# Patient Record
Sex: Female | Born: 1955 | Race: Black or African American | Hispanic: No | Marital: Single | Smoking: Never smoker
Health system: Southern US, Community
[De-identification: ages and names within clinical notes are randomized; demographics above are authoritative.]

## PROBLEM LIST (undated history)

## (undated) DIAGNOSIS — E785 Hyperlipidemia, unspecified: Secondary | ICD-10-CM

## (undated) DIAGNOSIS — A048 Other specified bacterial intestinal infections: Secondary | ICD-10-CM

## (undated) DIAGNOSIS — D126 Benign neoplasm of colon, unspecified: Secondary | ICD-10-CM

## (undated) DIAGNOSIS — K219 Gastro-esophageal reflux disease without esophagitis: Secondary | ICD-10-CM

## (undated) DIAGNOSIS — M7052 Other bursitis of knee, left knee: Principal | ICD-10-CM

## (undated) DIAGNOSIS — Z87440 Personal history of urinary (tract) infections: Secondary | ICD-10-CM

## (undated) DIAGNOSIS — I10 Essential (primary) hypertension: Secondary | ICD-10-CM

## (undated) DIAGNOSIS — E0590 Thyrotoxicosis, unspecified without thyrotoxic crisis or storm: Secondary | ICD-10-CM

## (undated) DIAGNOSIS — J45909 Unspecified asthma, uncomplicated: Secondary | ICD-10-CM

## (undated) DIAGNOSIS — M7051 Other bursitis of knee, right knee: Secondary | ICD-10-CM

## (undated) HISTORY — DX: Benign neoplasm of colon, unspecified: D12.6

## (undated) HISTORY — DX: Unspecified asthma, uncomplicated: J45.909

## (undated) HISTORY — DX: Gastro-esophageal reflux disease without esophagitis: K21.9

## (undated) HISTORY — DX: Other bursitis of knee, right knee: M70.51

## (undated) HISTORY — DX: Other specified bacterial intestinal infections: A04.8

## (undated) HISTORY — DX: Thyrotoxicosis, unspecified without thyrotoxic crisis or storm: E05.90

## (undated) HISTORY — DX: Personal history of urinary (tract) infections: Z87.440

## (undated) HISTORY — DX: Other bursitis of knee, left knee: M70.52

## (undated) HISTORY — DX: Hyperlipidemia, unspecified: E78.5

---

## 2016-05-21 ENCOUNTER — Ambulatory Visit
Admission: EM | Admit: 2016-05-21 | Discharge: 2016-05-21 | Disposition: A | Discharge status: Home / Self Care | Payer: Managed Care, Other (non HMO) | Attending: Family Medicine | Admitting: Family Medicine

## 2016-05-21 ENCOUNTER — Ambulatory Visit (INDEPENDENT_AMBULATORY_CARE_PROVIDER_SITE_OTHER): Payer: Managed Care, Other (non HMO)

## 2016-05-21 ENCOUNTER — Encounter: Payer: Self-pay | Admitting: Emergency Medicine

## 2016-05-21 ENCOUNTER — Ambulatory Visit: Payer: Managed Care, Other (non HMO)

## 2016-05-21 DIAGNOSIS — I10 Essential (primary) hypertension: Principal | ICD-10-CM

## 2016-05-21 DIAGNOSIS — R319 Hematuria, unspecified: Secondary | ICD-10-CM

## 2016-05-21 DIAGNOSIS — N898 Other specified noninflammatory disorders of vagina: Secondary | ICD-10-CM

## 2016-05-21 DIAGNOSIS — I998 Other disorder of circulatory system: Secondary | ICD-10-CM | POA: Diagnosis not present

## 2016-05-21 DIAGNOSIS — R030 Elevated blood-pressure reading, without diagnosis of hypertension: Secondary | ICD-10-CM | POA: Diagnosis not present

## 2016-05-21 DIAGNOSIS — M7551 Bursitis of right shoulder: Secondary | ICD-10-CM

## 2016-05-21 DIAGNOSIS — IMO0001 Reserved for inherently not codable concepts without codable children: Secondary | ICD-10-CM

## 2016-05-21 HISTORY — DX: Essential (primary) hypertension: I10

## 2016-05-21 LAB — URINALYSIS COMPLETE WITH MICROSCOPIC (ARMC ONLY)
Bacteria, UA: NONE SEEN
Bilirubin Urine: NEGATIVE
Glucose, UA: NEGATIVE mg/dL
Leukocytes, UA: NEGATIVE
NITRITE: NEGATIVE
PH: 6 (ref 5.0–8.0)
PROTEIN: NEGATIVE mg/dL
SPECIFIC GRAVITY, URINE: 1.015 (ref 1.005–1.030)
WBC UA: NONE SEEN WBC/hpf (ref 0–5)

## 2016-05-21 MED ORDER — TERCONAZOLE 80 MG VA SUPP: 80.0000 mg | Freq: Every day | VAGINAL | Status: DC

## 2016-05-21 MED ORDER — HYDROCHLOROTHIAZIDE 25 MG PO TABS: 25.0000 mg | ORAL_TABLET | Freq: Every day | ORAL | Status: DC

## 2016-05-21 MED ORDER — FLUCONAZOLE 150 MG PO TABS: 150.0000 mg | ORAL_TABLET | Freq: Once | ORAL | Status: DC

## 2016-05-21 MED ORDER — CLONIDINE HCL 0.2 MG PO TABS
0.2000 mg | ORAL_TABLET | Freq: Once | ORAL | Status: AC
  Administered 2016-05-21: 0.2 mg via ORAL

## 2016-05-21 MED ORDER — MELOXICAM 15 MG PO TABS: 15.0000 mg | ORAL_TABLET | Freq: Every day | ORAL | Status: DC

## 2016-05-21 MED ORDER — ATENOLOL 100 MG PO TABS: 50.0000 mg | ORAL_TABLET | Freq: Every day | ORAL | Status: DC

## 2016-05-21 NOTE — Discharge Instructions (Signed)
Bursitis Bursitis is when the fluid-filled sac (bursa) that covers and protects a joint is swollen (inflamed). Bursitis is most common near joints, especially the knees, elbows, hips, and shoulders.  HOME CARE  Take medicines only as told by your doctor.  If you were prescribed an antibiotic medicine, finish it all even if you start to feel better.  Rest the affected area as told by your doctor.  Keep the area raised up.  Avoid doing things that make the pain worse.  Apply ice to the injured area:  Place ice in a plastic bag.  Place a towel between your skin and the bag.  Leave the ice on for 20 minutes, 2-3 times a day.  Use splints, braces, pads, or walking aids as told by your doctor.  Keep all follow-up visits as told by your doctor. This is important. GET HELP IF:   You have more pain with home care.  You have a fever.  You have chills.   This information is not intended to replace advice given to you by your health care provider. Make sure you discuss any questions you have with your health care provider.   Document Released: 06/06/2010 Document Revised: 01/07/2015 Document Reviewed: 03/08/2014 Elsevier Interactive Patient Education 2016 Elsevier Inc.  Heart Disease Prevention Heart disease is a leading cause of death. There are many things you can do to help prevent heart disease. BE PHYSICALLY ACTIVE Physical activity is good for your heart. It helps control your blood pressure, cholesterol levels, and weight. Try to be physically active every day. Ask your health care provider what activities are best for you.  BE A HEALTHY WEIGHT Extra weight can strain your heart and affect your blood pressure and cholesterol levels. Lose weight with diet and exercise if recommended by your health care provider. EAT HEART-HEALTHY FOODS Follow a healthy eating plan as recommended by your health care provider or dietitian. Heart-healthy foods include:   High-fiber foods. These  include oat bran, oatmeal, and whole-grain breads and cereals.  Fruits and vegetables. Avoid:  Alcohol.  Fried foods.  Foods high in saturated fat. These include meats, butter, whole dairy products, shortening, and coconut or palm oil.  Salty foods. These include canned food, luncheon meat, salty snacks, and fast food. KEEP YOUR CHOLESTEROL LEVELS UNDER CONTROL Cholesterol is a substance that is used for many important functions. When your cholesterol levels are high, cholesterol can stick to the insides of your blood vessels, making them narrow or clog. This can lead to chest pain (angina) and a heart attack.  Keep your cholesterol levels under control as recommended by your health care provider. Have your cholesterol checked at least once a year. Target cholesterol levels (in mg/dL) for most people are:   Total cholesterol below 200.  LDL cholesterol below 100.  HDL cholesterol above 40 in men and above 50 in women.  Triglycerides below 150. KEEP YOUR BLOOD PRESSURE UNDER CONTROL Having high blood pressure (hypertension) puts you at risk for stroke and other forms of heart disease. Keep your blood pressure under control as recommended by your health care provider. Ask your health care provider if you need treatment to lower your blood pressure. If you are 85-65 years of age, have your blood pressure checked every 3-5 years. If you are 56 years of age or older, have your blood pressure checked every year. DO NOT USE TOBACCO PRODUCTS Tobacco smoke can damage your heart and blood vessels. Do not use any tobacco products including cigarettes,  chewing tobacco, or electronic cigarettes. If you need help quitting, ask your health care provider. TAKE MEDICINES AS DIRECTED Take medicines only as directed by your health care provider. Ask your health care provider whether you should take an aspirin every day. Taking aspirin can help reduce your risk of heart disease and stroke.  FOR MORE  INFORMATION  To find out more about heart disease, visit the American Heart Association's website at www.americanheart.org   This information is not intended to replace advice given to you by your health care provider. Make sure you discuss any questions you have with your health care provider.   Document Released: 07/31/2004 Document Revised: 01/07/2015 Document Reviewed: 02/10/2014 Elsevier Interactive Patient Education 2016 Elsevier Inc.  Hematuria, Adult Hematuria is blood in your urine. It can be caused by a bladder infection, kidney infection, prostate infection, kidney stone, or cancer of your urinary tract. Infections can usually be treated with medicine, and a kidney stone usually will pass through your urine. If neither of these is the cause of your hematuria, further workup to find out the reason may be needed. It is very important that you tell your health care provider about any blood you see in your urine, even if the blood stops without treatment or happens without causing pain. Blood in your urine that happens and then stops and then happens again can be a symptom of a very serious condition. Also, pain is not a symptom in the initial stages of many urinary cancers. HOME CARE INSTRUCTIONS   Drink lots of fluid, 3-4 quarts a day. If you have been diagnosed with an infection, cranberry juice is especially recommended, in addition to large amounts of water.  Avoid caffeine, tea, and carbonated beverages because they tend to irritate the bladder.  Avoid alcohol because it may irritate the prostate.  Take all medicines as directed by your health care provider.  If you were prescribed an antibiotic medicine, finish it all even if you start to feel better.  If you have been diagnosed with a kidney stone, follow your health care provider's instructions regarding straining your urine to catch the stone.  Empty your bladder often. Avoid holding urine for long periods of time.  After  a bowel movement, women should cleanse front to back. Use each tissue only once.  Empty your bladder before and after sexual intercourse if you are a female. SEEK MEDICAL CARE IF:  You develop back pain.  You have a fever.  You have a feeling of sickness in your stomach (nausea) or vomiting.  Your symptoms are not better in 3 days. Return sooner if you are getting worse. SEEK IMMEDIATE MEDICAL CARE IF:   You develop severe vomiting and are unable to keep the medicine down.  You develop severe back or abdominal pain despite taking your medicines.  You begin passing a large amount of blood or clots in your urine.  You feel extremely weak or faint, or you pass out. MAKE SURE YOU:   Understand these instructions.  Will watch your condition.  Will get help right away if you are not doing well or get worse.   This information is not intended to replace advice given to you by your health care provider. Make sure you discuss any questions you have with your health care provider.   Document Released: 12/17/2005 Document Revised: 01/07/2015 Document Reviewed: 08/17/2013 Elsevier Interactive Patient Education 2016 [employer].  Hypertension Hypertension is another name for high blood pressure. High blood pressure forces  your heart to work harder to pump blood. A blood pressure reading has two numbers, which includes a higher number over a lower number (example: 110/72). HOME CARE   Have your blood pressure rechecked by your doctor.  Only take medicine as told by your doctor. Follow the directions carefully. The medicine does not work as well if you skip doses. Skipping doses also puts you at risk for problems.  Do not smoke.  Monitor your blood pressure at home as told by your doctor. GET HELP IF:  You think you are having a reaction to the medicine you are taking.  You have repeat headaches or feel dizzy.  You have puffiness (swelling) in your ankles.  You have trouble  with your vision. GET HELP RIGHT AWAY IF:   You get a very bad headache and are confused.  You feel weak, numb, or faint.  You get chest or belly (abdominal) pain.  You throw up (vomit).  You cannot breathe very well. MAKE SURE YOU:   Understand these instructions.  Will watch your condition.  Will get help right away if you are not doing well or get worse.   This information is not intended to replace advice given to you by your health care provider. Make sure you discuss any questions you have with your health care provider.   Document Released: 06/04/2008 Document Revised: 12/22/2013 Document Reviewed: 10/09/2013 Elsevier Interactive Patient Education 2016 Elsevier Inc. Monilial Vaginitis Vaginitis in a soreness, swelling and redness (inflammation) of the vagina and vulva. Monilial vaginitis is not a sexually transmitted infection. CAUSES  Yeast vaginitis is caused by yeast (candida) that is normally found in your vagina. With a yeast infection, the candida has overgrown in number to a point that upsets the chemical balance. SYMPTOMS   White, thick vaginal discharge.  Swelling, itching, redness and irritation of the vagina and possibly the lips of the vagina (vulva).  Burning or painful urination.  Painful intercourse. DIAGNOSIS  Things that may contribute to monilial vaginitis are:  Postmenopausal and virginal states.  Pregnancy.  Infections.  Being tired, sick or stressed, especially if you had monilial vaginitis in the past.  Diabetes. Good control will help lower the chance.  Birth control pills.  Tight fitting garments.  Using bubble bath, feminine sprays, douches or deodorant tampons.  Taking certain medications that kill germs (antibiotics).  Sporadic recurrence can occur if you become ill. TREATMENT  Your caregiver will give you medication.  There are several kinds of anti monilial vaginal creams and suppositories specific for monilial  vaginitis. For recurrent yeast infections, use a suppository or cream in the vagina 2 times a week, or as directed.  Anti-monilial or steroid cream for the itching or irritation of the vulva may also be used. Get your caregiver's permission.  Painting the vagina with methylene blue solution may help if the monilial cream does not work.  Eating yogurt may help prevent monilial vaginitis. HOME CARE INSTRUCTIONS   Finish all medication as prescribed.  Do not have sex until treatment is completed or after your caregiver tells you it is okay.  Take warm sitz baths.  Do not douche.  Do not use tampons, especially scented ones.  Wear cotton underwear.  Avoid tight pants and panty hose.  Tell your sexual partner that you have a yeast infection. They should go to their caregiver if they have symptoms such as mild rash or itching.  Your sexual partner should be treated as well if your infection is  difficult to eliminate.  Practice safer sex. Use condoms.  Some vaginal medications cause latex condoms to fail. Vaginal medications that harm condoms are:  Cleocin cream.  Butoconazole (Femstat).  Terconazole (Terazol) vaginal suppository.  Miconazole (Monistat) (may be purchased over the counter). SEEK MEDICAL CARE IF:   You have a temperature by mouth above 102 F (38.9 C).  The infection is getting worse after 2 days of treatment.  The infection is not getting better after 3 days of treatment.  You develop blisters in or around your vagina.  You develop vaginal bleeding, and it is not your menstrual period.  You have pain when you urinate.  You develop intestinal problems.  You have pain with sexual intercourse.   This information is not intended to replace advice given to you by your health care provider. Make sure you discuss any questions you have with your health care provider.   Document Released: 09/26/2005 Document Revised: 03/10/2012 Document Reviewed:  06/20/2015 Elsevier Interactive Patient Education [employer].

## 2016-05-21 NOTE — ED Notes (Signed)
Patient c/o mid back pain for the past several months.

## 2016-05-21 NOTE — ED Provider Notes (Signed)
CSN: [id number]     Arrival date & time 05/21/16  1159 History   First MD Initiated Contact with Patient 05/21/16 1335    Nurses notes were reviewed. Chief Complaint  Patient presents with  . Back Pain  Multiple problems present   #1 for indeterminate amount of time several weeks she's had pain in her right shoulder. She states when she lifts something or move something she'll have pain in her right shoulder. She denies any trauma or injury to right shoulder.  #2 markedly elevated blood pressure. Patient states she's on blood pressure medicine she is taking Tenormin on a regular basis initially she states was taking 50 mg and then she stated to the nurse it was 100 mg. The other concern is that she's been here for [facility] for about 6 years has not seen a doctor and states since that time states that she is getting her blood pressure medicine from a friend or not going to need detail how she is getting the medicine who is filling the medicine. She also states that she did not take her blood pressure medicine today she comes the hospital today and of course this is urgent care She denies any chest pain or shortness of breath and cannot permit second, she's had hypertension. She states she gets nervous when she comes the doctor's office but despite nervousness blood pressure still markedly elevated. She states she does not have a PCP here in [place]  #3 vaginal irritation she states she salad [place] about 62 months ago started having vaginal irritation. She reports itching and burning in the vagina area. She denies sexual relations for over a year and she is convinced that she caught something on the toilet seat. She denies any discharge lesion just reports burning irritation inside the vaginal area    (Consider location/radiation/quality/duration/timing/severity/associated sxs/prior Treatment) Patient is a 60 y.o. female presenting with shoulder pain. The history is provided by the patient. No  language interpreter was used.  Shoulder Pain Location:  Shoulder Shoulder location:  R shoulder Pain details:    Quality:  Sharp   Radiates to:  Does not radiate   Severity:  Moderate   Onset quality:  Unable to specify   Timing:  Constant Handedness:  Right-handed Relieved by:  Nothing Worsened by:  Movement and bearing weight Ineffective treatments:  None tried   Past Medical History  Diagnosis Date  . Hypertension    Past Surgical History  Procedure Laterality Date  . Cesarean section     History reviewed. No pertinent family history. Social History  Substance Use Topics  . Smoking status: Never Smoker   . Smokeless tobacco: None  . Alcohol Use: No   OB History    No data available     Review of Systems  Respiratory: Positive for cough, chest tightness and shortness of breath.   Cardiovascular: Positive for chest pain.  Genitourinary: Positive for vaginal pain. Negative for vaginal bleeding and vaginal discharge.  All other systems reviewed and are negative.   Allergies  Review of patient's allergies indicates no known allergies.  Home Medications   Prior to Admission medications   Medication Sig Start Date End Date Taking? Authorizing Provider  atenolol (TENORMIN) 50 MG tablet Take 50 mg by mouth daily.   Yes Historical Provider, MD  atenolol (TENORMIN) 100 MG tablet Take 0.5 tablets (50 mg total) by mouth daily. 05/21/16   Frederich Cha, MD  fluconazole (DIFLUCAN) 150 MG tablet Take 1 tablet (150 mg total)  by mouth once. 05/21/16   Frederich Cha, MD  hydrochlorothiazide (HYDRODIURIL) 25 MG tablet Take 1 tablet (25 mg total) by mouth daily. 05/21/16   Frederich Cha, MD  meloxicam (MOBIC) 15 MG tablet Take 1 tablet (15 mg total) by mouth daily. 05/21/16   Frederich Cha, MD  terconazole (TERAZOL 3) 80 MG vaginal suppository Place 1 suppository (80 mg total) vaginally at bedtime. 05/21/16   Frederich Cha, MD   Meds Ordered and Administered this Visit   Medications    cloNIDine (CATAPRES) tablet 0.2 mg (0.2 mg Oral Given 05/21/16 1452)    BP 202/97 mmHg  Pulse 70  Temp(Src) 98 F (36.7 C) (Tympanic)  Resp 16  Ht 5\' 3"  (1.6 m)  Wt 200 lb (90.719 kg)  BMI 35.44 kg/m2  SpO2 98% No data found.   Physical Exam  Constitutional: She is oriented to person, place, and time. She appears well-developed and well-nourished. No distress.  HENT:  Head: Normocephalic and atraumatic.  Right Ear: External ear normal.  Left Ear: External ear normal.  Eyes: Conjunctivae are normal. Pupils are equal, round, and reactive to light.  Neck: Normal range of motion. Neck supple.  Cardiovascular: Normal rate and regular rhythm.   Pulmonary/Chest: Effort normal and breath sounds normal.  Musculoskeletal: Normal range of motion. She exhibits tenderness. She exhibits no edema.       Right shoulder: She exhibits tenderness and bony tenderness. She exhibits no deformity.       Arms: A stat has tenderness on the right scapula consistent with bursitis  Neurological: She is alert and oriented to person, place, and time.  Skin: Skin is warm and dry.  Psychiatric: She has a normal mood and affect.  Vitals reviewed.   ED Course  Procedures (including critical care time)  Labs Review Labs Reviewed  URINALYSIS COMPLETEWITH MICROSCOPIC ([place]) - Abnormal; Notable for the following:    Ketones, ur 2+ (*)    Hgb urine dipstick 1+ (*)    Squamous Epithelial / LPF 0-5 (*)    All other components within normal limits  URINE CULTURE    Imaging Review Dg Scapula Left  05/21/2016  CLINICAL DATA:  Pt states she has been having pain in left medial border of shoulder blade area x 3 months. 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 FINDINGS: There is no evidence of fracture or other focal bone lesions. Soft tissues are unremarkable. IMPRESSION: Negative. Electronically Signed   By: Skipper Cliche  M.D.   On: 05/21/2016 14:22     Visual Acuity Review  Right Eye Distance:   Left Eye Distance:   Bilateral Distance:    Right Eye Near:   Left Eye Near:    Bilateral Near:       Results for orders placed or performed during the hospital encounter of 05/21/16  Urinalysis complete, with microscopic  Result Value Ref Range   Color, Urine YELLOW YELLOW   APPearance CLEAR CLEAR   Glucose, UA NEGATIVE NEGATIVE mg/dL   Bilirubin Urine NEGATIVE NEGATIVE   Ketones, ur 2+ (A) NEGATIVE mg/dL   Specific Gravity, Urine 1.015 1.005 - 1.030   Hgb urine dipstick 1+ (A) NEGATIVE   pH 6.0 5.0 - 8.0   Protein, ur NEGATIVE NEGATIVE mg/dL   Nitrite NEGATIVE NEGATIVE   Leukocytes, UA NEGATIVE NEGATIVE   RBC / HPF 0-5 0 - 5 RBC/hpf   WBC, UA  NONE SEEN 0 - 5 WBC/hpf   Bacteria, UA NONE SEEN NONE SEEN   Squamous Epithelial / LPF 0-5 (A) NONE SEEN    MDM   1. Elevated systolic blood pressure   2. Poorly controlled blood pressure   3. Bursitis, scapulohumeral, right   4. Hematuria, undiagnosed cause   5. Vaginal irritation       Problem#1 right shoulder bursitis. Would recommend Mobic 15 mg 1 tablet day if shoulder continues to bother her as a problem she may need to have a steroid injection in the right bursa.   Problem #2 markedly elevated blood pressure. Patient states she's been on Tenormin 100 mg not sure to completely trust that but with the blood pressure being over 200 we'll go ahead and refill once a months worth of Tenormin 100 mg. Will give her the name of a PCP Dr. Lacinda Axon that she can see and to try to get his blood pressure under control. Also recommend along with treating with Tenormin also placing her on 25 mg of hydrochlorothiazide as well. While here in the urgent care she was given 0.2 mg of clonidine by mouth to try to get the systolic under [id number]   Problem #3 history of vaginal pain and irritation. Because she states that she's not been sexually active in over a year ago  treated with Diflucan orally and one round of Terazol vaginal suppository if she continues to have vaginal irritation she will need to see her PCP or Dr. have a pelvic exam   Problem #4 hematuria she does have blood in urine she'll need to have a repeat urine and will have her follow-up with her PCP that she is going to see    We'll give her a work note for today and tomorrow. Note: This dictation was prepared with Dragon dictation along with smaller phrase technology. Any transcriptional errors that result from this process are unintentional.      Frederich Cha, MD 05/21/16 [phone]

## 2016-05-22 LAB — URINE CULTURE: Special Requests: NORMAL

## 2016-06-11 ENCOUNTER — Ambulatory Visit: Payer: Managed Care, Other (non HMO) | Admitting: Family Medicine

## 2016-06-14 ENCOUNTER — Encounter: Payer: Self-pay | Admitting: Family Medicine

## 2016-06-14 ENCOUNTER — Other Ambulatory Visit (HOSPITAL_COMMUNITY)
Admission: RE | Admit: 2016-06-14 | Discharge: 2016-06-14 | Disposition: A | Discharge status: Home / Self Care | Payer: Managed Care, Other (non HMO) | Source: Ambulatory Visit | Attending: Family Medicine | Admitting: Family Medicine

## 2016-06-14 ENCOUNTER — Ambulatory Visit (INDEPENDENT_AMBULATORY_CARE_PROVIDER_SITE_OTHER): Payer: Managed Care, Other (non HMO) | Admitting: Family Medicine

## 2016-06-14 VITALS — BP 224/122 | HR 81 | Temp 98.6°F | Ht 63.0 in | Wt 192.2 lb

## 2016-06-14 DIAGNOSIS — Z0001 Encounter for general adult medical examination with abnormal findings: Secondary | ICD-10-CM | POA: Diagnosis not present

## 2016-06-14 DIAGNOSIS — Z130 Encounter for screening for diseases of the blood and blood-forming organs and certain disorders involving the immune mechanism: Secondary | ICD-10-CM | POA: Diagnosis not present

## 2016-06-14 DIAGNOSIS — Z13220 Encounter for screening for lipoid disorders: Secondary | ICD-10-CM

## 2016-06-14 DIAGNOSIS — I10 Essential (primary) hypertension: Secondary | ICD-10-CM | POA: Diagnosis not present

## 2016-06-14 DIAGNOSIS — N898 Other specified noninflammatory disorders of vagina: Secondary | ICD-10-CM

## 2016-06-14 DIAGNOSIS — Z01419 Encounter for gynecological examination (general) (routine) without abnormal findings: Secondary | ICD-10-CM | POA: Diagnosis present

## 2016-06-14 DIAGNOSIS — E0590 Thyrotoxicosis, unspecified without thyrotoxic crisis or storm: Secondary | ICD-10-CM

## 2016-06-14 DIAGNOSIS — Z124 Encounter for screening for malignant neoplasm of cervix: Secondary | ICD-10-CM

## 2016-06-14 DIAGNOSIS — Z8639 Personal history of other endocrine, nutritional and metabolic disease: Secondary | ICD-10-CM | POA: Diagnosis not present

## 2016-06-14 DIAGNOSIS — Z1151 Encounter for screening for human papillomavirus (HPV): Secondary | ICD-10-CM | POA: Diagnosis present

## 2016-06-14 DIAGNOSIS — Z1239 Encounter for other screening for malignant neoplasm of breast: Secondary | ICD-10-CM | POA: Diagnosis not present

## 2016-06-14 DIAGNOSIS — N760 Acute vaginitis: Secondary | ICD-10-CM | POA: Insufficient documentation

## 2016-06-14 DIAGNOSIS — R300 Dysuria: Secondary | ICD-10-CM | POA: Diagnosis not present

## 2016-06-14 DIAGNOSIS — R6889 Other general symptoms and signs: Secondary | ICD-10-CM

## 2016-06-14 LAB — POCT URINALYSIS DIPSTICK
Bilirubin, UA: NEGATIVE
Glucose, UA: NEGATIVE
Ketones, UA: NEGATIVE
Leukocytes, UA: NEGATIVE
NITRITE UA: NEGATIVE
PROTEIN UA: NEGATIVE
RBC UA: NEGATIVE
SPEC GRAV UA: 1.015
UROBILINOGEN UA: NEGATIVE
pH, UA: 6.5

## 2016-06-14 LAB — LIPID PANEL
Cholesterol: 193 mg/dL (ref 0–200)
HDL: 63.2 mg/dL (ref >39.00)
LDL Cholesterol: 122 mg/dL — ABNORMAL HIGH (ref 0–99)
NONHDL: 129.33
Total CHOL/HDL Ratio: 3
Triglycerides: 37 mg/dL (ref 0.0–149.0)
VLDL: 7.4 mg/dL (ref 0.0–40.0)

## 2016-06-14 LAB — CBC
HCT: 36.5 % (ref 36.0–46.0)
Hemoglobin: 12.2 g/dL (ref 12.0–15.0)
MCHC: 33.4 g/dL (ref 30.0–36.0)
MCV: 86.3 fl (ref 78.0–100.0)
Platelets: 221 10*3/uL (ref 150.0–400.0)
RBC: 4.23 Mil/uL (ref 3.87–5.11)
RDW: 14.5 % (ref 11.5–15.5)
WBC: 3.7 10*3/uL — AB (ref 4.0–10.5)

## 2016-06-14 LAB — COMPREHENSIVE METABOLIC PANEL
ALK PHOS: 61 U/L (ref 39–117)
ALT: 12 U/L (ref 0–35)
AST: 19 U/L (ref 0–37)
Albumin: 4.5 g/dL (ref 3.5–5.2)
BILIRUBIN TOTAL: 0.6 mg/dL (ref 0.2–1.2)
BUN: 11 mg/dL (ref 6–23)
CO2: 31 mEq/L (ref 19–32)
CREATININE: 0.84 mg/dL (ref 0.40–1.20)
Calcium: 10.2 mg/dL (ref 8.4–10.5)
Chloride: 104 mEq/L (ref 96–112)
GFR: 88.53 mL/min (ref >60.00)
GLUCOSE: 110 mg/dL — AB (ref 70–99)
Potassium: 3.9 mEq/L (ref 3.5–5.1)
SODIUM: 140 meq/L (ref 135–145)
TOTAL PROTEIN: 8.2 g/dL (ref 6.0–8.3)

## 2016-06-14 LAB — HEMOGLOBIN A1C: HEMOGLOBIN A1C: 5.1 % (ref 4.6–6.5)

## 2016-06-14 LAB — TSH: TSH: 1.6 u[IU]/mL (ref 0.35–4.50)

## 2016-06-14 LAB — T3, FREE: T3 FREE: 3.1 pg/mL (ref 2.3–4.2)

## 2016-06-14 LAB — T4, FREE: FREE T4: 0.97 ng/dL (ref 0.60–1.60)

## 2016-06-14 MED ORDER — AMLODIPINE BESYLATE 10 MG PO TABS: 10.0000 mg | ORAL_TABLET | Freq: Every day | ORAL | Status: DC

## 2016-06-14 NOTE — Progress Notes (Signed)
Pre visit review using our clinic review tool, if applicable. No additional management support is needed unless otherwise documented below in the visit note. 

## 2016-06-14 NOTE — Patient Instructions (Signed)
I have started you on an additional pill for your BP (amlodipine). Take it daily.  Increase the Atenolol to 100 mg (1 pill) daily.  Continue the HCTZ.  Follow up next week (Monday - 9:45 am).  Take care  Dr. Lacinda Axon

## 2016-06-15 ENCOUNTER — Encounter: Payer: Self-pay | Admitting: Family Medicine

## 2016-06-15 DIAGNOSIS — Z131 Encounter for screening for diabetes mellitus: Secondary | ICD-10-CM | POA: Insufficient documentation

## 2016-06-15 DIAGNOSIS — I10 Essential (primary) hypertension: Secondary | ICD-10-CM | POA: Insufficient documentation

## 2016-06-15 DIAGNOSIS — Z0001 Encounter for general adult medical examination with abnormal findings: Secondary | ICD-10-CM | POA: Insufficient documentation

## 2016-06-15 DIAGNOSIS — Z0000 Encounter for general adult medical examination without abnormal findings: Secondary | ICD-10-CM | POA: Insufficient documentation

## 2016-06-15 DIAGNOSIS — N898 Other specified noninflammatory disorders of vagina: Secondary | ICD-10-CM | POA: Insufficient documentation

## 2016-06-15 LAB — CYTOLOGY - PAP

## 2016-06-15 NOTE — Assessment & Plan Note (Addendum)
Uncontrolled. Blood pressure markedly elevated today. Adding Norvasc. Patient to continue atenolol and HCTZ. Advised to stop meloxicam. Follow-up next week.

## 2016-06-15 NOTE — Assessment & Plan Note (Signed)
Urinalysis was negative. Pelvic exam revealed atrophy. This is likely culprit of her symptoms.

## 2016-06-15 NOTE — Assessment & Plan Note (Signed)
Mammogram scheduled. Pap smear done today. Tetanus up-to-date. Will discuss colonoscopy, hep C screening, HIV screening at later visit.

## 2016-06-15 NOTE — Progress Notes (Signed)
Subjective:  Patient ID: Gail Baker, female    DOB: 1956-05-20  Age: 60 y.o. MRN: [id number]  CC: Establish care  HPI Gail Baker is a 60 y.o. female presents to the clinic today to establish care. Severely elevated BP today (see below). Also reports vaginal irritation/itching, burning with urination.  Preventative Healthcare  Pap smear: Has never had. In need of. Okay with doing today.  Mammogram: In need of. Has never had a mammogram. Will schedule.  Colonoscopy: Has never had. Will discuss today.  Immunizations  Tetanus - 2012.  Pneumococcal - N/A.  Flu - Not indicated at this time.  Zoster - In need of.  Hepatitis C screening - Unsure if she's had testing.  Labs: Labs today.  Exercise: No.  Alcohol use: No.  Smoking/tobacco use: No.  STD/HIV testing: Unsure if she has had HIV/STD testing previously.   Regular dental exams: No.  Wears seat belt: Yes.   HTN  BP markedly elevated today (see below).  Patient currently on atenolol and HCTZ.  She endorses compliance.  No current symptoms: Chest pain, SOB, Dizziness, etc.  Vaginal irritation  Patient reports that she's recently been experiencing some vaginal irritation.  She states that she's having itching and burning.  She also has burning with urination.  No known exacerbating or relieving.  It appears that she was treated previously with Diflucan.  She's had no improvement.  PMH, Surgical Hx, Family Hx, Social History reviewed and updated as below.  Past Medical History  Diagnosis Date  . Hypertension   . Hyperthyroidism   . Asthma     Not active. Asthma as a child.  Marland Kitchen History of UTI    Past Surgical History  Procedure Laterality Date  . Cesarean section     Family History  Problem Relation Age of Onset  . Hypertension Mother   . Hypertension Father   . Diabetes Father    Social History  Substance Use Topics  . Smoking status: Never Smoker   . Smokeless tobacco:  Never Used  . Alcohol Use: No   Review of Systems  Musculoskeletal: Positive for arthralgias.  Psychiatric/Behavioral: The patient is nervous/anxious.   All other systems reviewed and are negative.  Objective:   Today's Vitals: BP 224/122 mmHg  Pulse 81  Temp(Src) 98.6 F (37 C) (Oral)  Ht 5' 3"  (1.6 m)  Wt 192 lb 4 oz (87.204 kg)  BMI 34.06 kg/m2  SpO2 96%  Physical Exam  Constitutional: She is oriented to person, place, and time. She appears well-developed. No distress.  HENT:  Head: Normocephalic and atraumatic.  Mouth/Throat: Oropharynx is clear and moist.  Eyes: Conjunctivae are normal. No scleral icterus.  Neck: Neck supple.  Cardiovascular: Normal rate.   Pulmonary/Chest: Effort normal. She has no wheezes. She has no rales.  Abdominal: Soft. She exhibits no distension. There is no tenderness. There is no rebound and no guarding.  Genitourinary:  Pelvic Exam: External: Atrophy noted. Otherwise normal. Vagina: normal without lesions or masses Cervix: normal without lesions or masses. Pap smear: performed   Musculoskeletal: Normal range of motion. She exhibits no edema.  Neurological: She is alert and oriented to person, place, and time.  Skin: Skin is warm and dry. No rash noted.  Psychiatric: She has a normal mood and affect.  Vitals reviewed.  Assessment & Plan:   Problem List Items Addressed This Visit    Encounter for preventative adult health care exam with abnormal findings - Primary  Mammogram scheduled. Pap smear done today. Tetanus up-to-date. Will discuss colonoscopy, hep C screening, HIV screening at later visit.       Severe hypertension    Uncontrolled. Blood pressure markedly elevated today. Adding Norvasc. Patient to continue atenolol and HCTZ. Advised to stop meloxicam. Follow-up next week.      Relevant Medications   amLODipine (NORVASC) 10 MG tablet   Other Relevant Orders   Comp Met (CMET) (Completed)   HgB A1c (Completed)    Vaginal irritation    Urinalysis was negative. Pelvic exam revealed atrophy. This is likely culprit of her symptoms.      Relevant Orders   POCT Urinalysis Dipstick (Completed)    Other Visit Diagnoses    Screening for breast cancer        Relevant Orders    MM DIGITAL SCREENING BILATERAL    Screening for deficiency anemia        Relevant Orders    CBC (Completed)    Screening, lipid        Relevant Orders    Lipid Profile (Completed)    History of hyperthyroidism        Relevant Orders    TSH (Completed)    T3, free (Completed)    T4, free (Completed)    Screening for cervical cancer        Relevant Orders    Cytology - PAP      Outpatient Encounter Prescriptions as of 06/14/2016  Medication Sig  . atenolol (TENORMIN) 100 MG tablet Take 0.5 tablets (50 mg total) by mouth daily.  . hydrochlorothiazide (HYDRODIURIL) 25 MG tablet Take 1 tablet (25 mg total) by mouth daily.  . meloxicam (MOBIC) 15 MG tablet Take 1 tablet (15 mg total) by mouth daily.  . [DISCONTINUED] fluconazole (DIFLUCAN) 150 MG tablet Take 1 tablet (150 mg total) by mouth once.  . [DISCONTINUED] terconazole (TERAZOL 3) 80 MG vaginal suppository Place 1 suppository (80 mg total) vaginally at bedtime.  Marland Kitchen amLODipine (NORVASC) 10 MG tablet Take 1 tablet (10 mg total) by mouth daily.   No facility-administered encounter medications on file as of 06/14/2016.   Follow-up: Next week.  [place]

## 2016-06-18 ENCOUNTER — Encounter: Payer: Self-pay | Admitting: Family Medicine

## 2016-06-18 ENCOUNTER — Ambulatory Visit (INDEPENDENT_AMBULATORY_CARE_PROVIDER_SITE_OTHER): Payer: Managed Care, Other (non HMO) | Admitting: Family Medicine

## 2016-06-18 VITALS — BP 172/94 | HR 70 | Temp 98.4°F | Ht 63.0 in | Wt 188.8 lb

## 2016-06-18 DIAGNOSIS — I10 Essential (primary) hypertension: Secondary | ICD-10-CM

## 2016-06-18 LAB — CERVICOVAGINAL ANCILLARY ONLY
BACTERIAL VAGINITIS: NEGATIVE
Candida vaginitis: NEGATIVE

## 2016-06-18 MED ORDER — CARVEDILOL 6.25 MG PO TABS: 6.2500 mg | ORAL_TABLET | Freq: Two times a day (BID) | ORAL | Status: DC

## 2016-06-18 MED ORDER — HYDROCHLOROTHIAZIDE 25 MG PO TABS: 25.0000 mg | ORAL_TABLET | Freq: Every day | ORAL | Status: DC

## 2016-06-18 NOTE — Assessment & Plan Note (Signed)
Established problem, uncontrolled. BP improved from prior but still not at goal. Stopping atenolol. Starting carvedilol. Patient to continue both Norvasc and HCTZ. Follow up in 2 weeks.

## 2016-06-18 NOTE — Patient Instructions (Signed)
Continue the HCTZ and the Norvasc.  I have changed the atenolol to Carvedilol.   Follow up in 2 weeks.  Take care  Dr. Lacinda Axon

## 2016-06-18 NOTE — Progress Notes (Signed)
   Subjective:  Patient ID: Gail Baker, female    DOB: [birth year]  Age: 60 y.o. MRN: [id number]  CC: HTN follow up  HPI:  60 year old female presents for follow-up regarding her hypertension.  HTN  Severe and uncontrolled.  At last visit, Norvasc was added.  Patient presents today for follow-up.  She states that she's been compliant with her medications: Atenolol, HCTZ, Norvasc.  She reports that she's had a slight headache associated with the Norvasc.  She otherwise feels well. No complaints today.  Social Hx   Social History   Social History  . Marital Status: Single    Spouse Name: N/A  . Number of Children: N/A  . Years of Education: N/A   Social History Main Topics  . Smoking status: Never Smoker   . Smokeless tobacco: Never Used  . Alcohol Use: No  . Drug Use: No  . Sexual Activity: Not Asked   Other Topics Concern  . None   Social History Narrative   Review of Systems  Constitutional: Negative.   Respiratory: Negative.   Cardiovascular: Negative.   Neurological: Positive for headaches.   Objective:  BP 172/94 mmHg  Pulse 70  Temp(Src) 98.4 F (36.9 C) (Oral)  Ht 5\' 3"  (1.6 m)  Wt 188 lb 12 oz (85.616 kg)  BMI 33.44 kg/m2  SpO2 98%  BP/Weight 06/18/2016 06/14/2016 [id number]  Systolic BP Q000111Q [id number]  Diastolic BP 94 123XX123 94  Wt. (Lbs) 188.75 192.25 200  BMI 33.44 34.06 35.44   Physical Exam  Constitutional: She is oriented to person, place, and time. She appears well-developed. No distress.  Cardiovascular: Normal rate and regular rhythm.   Pulmonary/Chest: Effort normal and breath sounds normal.  Neurological: She is alert and oriented to person, place, and time.  Psychiatric: She has a normal mood and affect.  Vitals reviewed.  Lab Results  Component Value Date   WBC 3.7* 06/14/2016   HGB 12.2 06/14/2016   HCT 36.5 06/14/2016   PLT 221.0 06/14/2016   GLUCOSE 110* 06/14/2016   CHOL 193 06/14/2016   TRIG 37.0 06/14/2016   HDL  63.20 06/14/2016   LDLCALC 122* 06/14/2016   ALT 12 06/14/2016   AST 19 06/14/2016   NA 140 06/14/2016   K 3.9 06/14/2016   CL 104 06/14/2016   CREATININE 0.84 06/14/2016   BUN 11 06/14/2016   CO2 31 06/14/2016   TSH 1.60 06/14/2016   HGBA1C 5.1 06/14/2016   Assessment & Plan:   Problem List Items Addressed This Visit    Severe hypertension - Primary    Established problem, uncontrolled. BP improved from prior but still not at goal. Stopping atenolol. Starting carvedilol. Patient to continue both Norvasc and HCTZ. Follow up in 2 weeks.      Relevant Medications   carvedilol (COREG) 6.25 MG tablet   hydrochlorothiazide (HYDRODIURIL) 25 MG tablet      Meds ordered this encounter  Medications  . carvedilol (COREG) 6.25 MG tablet    Sig: Take 1 tablet (6.25 mg total) by mouth 2 (two) times daily with a meal.    Dispense:  60 tablet    Refill:  3  . hydrochlorothiazide (HYDRODIURIL) 25 MG tablet    Sig: Take 1 tablet (25 mg total) by mouth daily.    Dispense:  90 tablet    Refill:  3    Follow-up: 2 weeks.  [place]

## 2016-06-29 ENCOUNTER — Other Ambulatory Visit: Payer: Self-pay | Admitting: Family Medicine

## 2016-06-29 ENCOUNTER — Encounter: Payer: Managed Care, Other (non HMO) | Admitting: Family Medicine

## 2016-06-29 ENCOUNTER — Ambulatory Visit
Admission: RE | Admit: 2016-06-29 | Discharge: 2016-06-29 | Disposition: A | Discharge status: Home / Self Care | Payer: Managed Care, Other (non HMO) | Source: Ambulatory Visit | Attending: Family Medicine | Admitting: Family Medicine

## 2016-06-29 DIAGNOSIS — Z1239 Encounter for other screening for malignant neoplasm of breast: Secondary | ICD-10-CM

## 2016-06-29 DIAGNOSIS — Z1231 Encounter for screening mammogram for malignant neoplasm of breast: Secondary | ICD-10-CM | POA: Insufficient documentation

## 2016-07-02 ENCOUNTER — Other Ambulatory Visit: Payer: Self-pay | Admitting: Family Medicine

## 2016-07-02 ENCOUNTER — Ambulatory Visit (INDEPENDENT_AMBULATORY_CARE_PROVIDER_SITE_OTHER): Payer: Managed Care, Other (non HMO) | Admitting: Family Medicine

## 2016-07-02 ENCOUNTER — Encounter: Payer: Self-pay | Admitting: Family Medicine

## 2016-07-02 VITALS — BP 158/90 | HR 83 | Temp 98.5°F | Wt 187.6 lb

## 2016-07-02 DIAGNOSIS — I10 Essential (primary) hypertension: Secondary | ICD-10-CM | POA: Diagnosis not present

## 2016-07-02 DIAGNOSIS — N6459 Other signs and symptoms in breast: Secondary | ICD-10-CM

## 2016-07-02 MED ORDER — CARVEDILOL 12.5 MG PO TABS: 6.2500 mg | ORAL_TABLET | Freq: Two times a day (BID) | ORAL | Status: DC

## 2016-07-02 NOTE — Assessment & Plan Note (Signed)
Established problem, remains uncontrolled/not at goal. Increasing Coreg today. Will plan to add Aldactone or ACEI if continues to be uncontrolled. May need work up for secondary HTN.

## 2016-07-02 NOTE — Patient Instructions (Addendum)
I increased the coreg. Take it twice daily.  Follow up in 2 weeks.  Call if you have any concerns or issues.  Take care  Dr. Lacinda Axon

## 2016-07-02 NOTE — Progress Notes (Addendum)
   Subjective:  Patient ID: Gail Baker, female    DOB: 1956/10/05  Age: 60 y.o. MRN: [id number]  CC: Follow up HTN  HPI:  60 year old female with HTN presents for follow up.  HTN  Feeling well. BP still up.  Compliant with Norvasc, Coreg, HCTZ.  No reports of chest pain, SOB, Dizziness.  No other complaints today.  Social Hx   Social History   Social History  . Marital Status: Single    Spouse Name: N/A  . Number of Children: N/A  . Years of Education: N/A   Social History Main Topics  . Smoking status: Never Smoker   . Smokeless tobacco: Never Used  . Alcohol Use: No  . Drug Use: No  . Sexual Activity: Not Asked   Other Topics Concern  . None   Social History Narrative   Review of Systems  Constitutional: Negative.   Respiratory: Negative.   Cardiovascular: Negative.    Objective:  BP 158/90 mmHg  Pulse 83  Temp(Src) 98.5 F (36.9 C) (Oral)  Wt 187 lb 9.6 oz (85.095 kg)  SpO2 98%  BP/Weight 07/02/2016 06/18/2016 [id number]  Systolic BP 0000000 Q000111Q [id number]  Diastolic BP 90 94 123XX123  Wt. (Lbs) 187.6 188.75 192.25  BMI 33.24 33.44 34.06   Physical Exam  Constitutional: She is oriented to person, place, and time. She appears well-developed. No distress.  Cardiovascular: Normal rate and regular rhythm.   2/6 systolic murmur.  Pulmonary/Chest: Effort normal. She has no wheezes. She has no rales.  Neurological: She is alert and oriented to person, place, and time.  Psychiatric: She has a normal mood and affect.  Vitals reviewed.  Lab Results  Component Value Date   WBC 3.7* 06/14/2016   HGB 12.2 06/14/2016   HCT 36.5 06/14/2016   PLT 221.0 06/14/2016   GLUCOSE 110* 06/14/2016   CHOL 193 06/14/2016   TRIG 37.0 06/14/2016   HDL 63.20 06/14/2016   LDLCALC 122* 06/14/2016   ALT 12 06/14/2016   AST 19 06/14/2016   NA 140 06/14/2016   K 3.9 06/14/2016   CL 104 06/14/2016   CREATININE 0.84 06/14/2016   BUN 11 06/14/2016   CO2 31 06/14/2016   TSH 1.60  06/14/2016   HGBA1C 5.1 06/14/2016   Assessment & Plan:   Problem List Items Addressed This Visit    Hypertension - Primary    Established problem, remains uncontrolled/not at goal. Increasing Coreg today. Will plan to add Aldactone or ACEI if continues to be uncontrolled. May need work up for secondary HTN.      Relevant Medications   carvedilol (COREG) 12.5 MG tablet      Meds ordered this encounter  Medications  . carvedilol (COREG) 12.5 MG tablet    Sig: Take 0.5 tablets (6.25 mg total) by mouth 2 (two) times daily with a meal.    Dispense:  60 tablet    Refill:  3   Follow-up: 2 weeks  [place]

## 2016-07-02 NOTE — Progress Notes (Signed)
Pre visit review using our clinic review tool, if applicable. No additional management support is needed unless otherwise documented below in the visit note. 

## 2016-07-05 ENCOUNTER — Telehealth: Payer: Self-pay | Admitting: *Deleted

## 2016-07-05 MED ORDER — MELOXICAM 15 MG PO TABS: 15.0000 mg | ORAL_TABLET | Freq: Every day | ORAL | Status: DC

## 2016-07-05 NOTE — Telephone Encounter (Signed)
Patient stated that Kristopher Oppenheim did not receive the Meloxicam Rx Pt contact [phone]

## 2016-07-05 NOTE — Telephone Encounter (Signed)
Please advise refill, you have not prescribed this. thanks

## 2016-07-09 NOTE — Telephone Encounter (Addendum)
Patient requested a update on this medication refill  Pt [phone]

## 2016-07-09 NOTE — Telephone Encounter (Signed)
Left a VM for patient, it was refilled on the 6th, thanks

## 2016-07-10 ENCOUNTER — Ambulatory Visit
Admission: RE | Admit: 2016-07-10 | Discharge: 2016-07-10 | Disposition: A | Discharge status: Home / Self Care | Payer: Managed Care, Other (non HMO) | Source: Ambulatory Visit | Attending: Family Medicine | Admitting: Family Medicine

## 2016-07-10 DIAGNOSIS — R921 Mammographic calcification found on diagnostic imaging of breast: Secondary | ICD-10-CM | POA: Insufficient documentation

## 2016-07-10 DIAGNOSIS — N6489 Other specified disorders of breast: Secondary | ICD-10-CM | POA: Insufficient documentation

## 2016-07-10 DIAGNOSIS — N6459 Other signs and symptoms in breast: Secondary | ICD-10-CM

## 2016-07-10 DIAGNOSIS — R6889 Other general symptoms and signs: Secondary | ICD-10-CM | POA: Insufficient documentation

## 2016-07-16 ENCOUNTER — Ambulatory Visit (INDEPENDENT_AMBULATORY_CARE_PROVIDER_SITE_OTHER): Payer: Managed Care, Other (non HMO) | Admitting: Family Medicine

## 2016-07-16 ENCOUNTER — Encounter: Payer: Self-pay | Admitting: Family Medicine

## 2016-07-16 VITALS — BP 164/102 | HR 83 | Temp 98.6°F | Wt 185.5 lb

## 2016-07-16 DIAGNOSIS — I10 Essential (primary) hypertension: Secondary | ICD-10-CM

## 2016-07-16 MED ORDER — CARVEDILOL 12.5 MG PO TABS: 12.5000 mg | ORAL_TABLET | Freq: Two times a day (BID) | ORAL | Status: DC

## 2016-07-16 NOTE — Assessment & Plan Note (Signed)
Establish problem, remains uncontrolled but slowly improving. Increasing Coreg to 12.5 mg BID. F/U in 2 weeks.

## 2016-07-16 NOTE — Addendum Note (Signed)
Addended by: Coral Spikes on: 07/16/2016 02:05 PM   Modules accepted: Miquel Dunn

## 2016-07-16 NOTE — Progress Notes (Signed)
   Subjective:  Patient ID: Gail Baker, female    DOB: [birth year]  Age: 60 y.o. MRN: [id number]  CC: HTN   HPI:  60 year old female presents for follow-up regarding her hypertension. She is being followed closely by me in efforts to get her severely elevated blood pressure under control.  HTN  BP still up. See vitals below.   Endorses compliance with Norvasc, Coreg, and HCTZ.  No reported side effects.  States that she's recently eaten high sodium meals. Recent headache. No other complaints.  Social Hx   Social History   Social History  . Marital Status: Single    Spouse Name: N/A  . Number of Children: N/A  . Years of Education: N/A   Social History Main Topics  . Smoking status: Never Smoker   . Smokeless tobacco: Never Used  . Alcohol Use: No  . Drug Use: No  . Sexual Activity: Not Asked   Other Topics Concern  . None   Social History Narrative   Review of Systems  Respiratory: Negative.   Cardiovascular: Negative.   Neurological:       Headache.   Objective:  BP 164/102 mmHg  Pulse 83  Temp(Src) 98.6 F (37 C) (Oral)  Wt 185 lb 8 oz (84.142 kg)  SpO2 98%  BP/Weight 07/16/2016 07/02/2016 Q000111Q  Systolic BP 123456 0000000 Q000111Q  Diastolic BP A999333 90 94  Wt. (Lbs) 185.5 187.6 188.75  BMI 32.87 33.24 33.44   Physical Exam  Constitutional: She is oriented to person, place, and time. She appears well-developed. No distress.  Cardiovascular: Normal rate and regular rhythm.   99991111 systolic murmur heard best at 2nd ICS.  Pulmonary/Chest: Effort normal. She has no wheezes. She has no rales.  Neurological: She is alert and oriented to person, place, and time.  Psychiatric: She has a normal mood and affect.  Vitals reviewed.  Lab Results  Component Value Date   WBC 3.7* 06/14/2016   HGB 12.2 06/14/2016   HCT 36.5 06/14/2016   PLT 221.0 06/14/2016   GLUCOSE 110* 06/14/2016   CHOL 193 06/14/2016   TRIG 37.0 06/14/2016   HDL 63.20 06/14/2016   LDLCALC  122* 06/14/2016   ALT 12 06/14/2016   AST 19 06/14/2016   NA 140 06/14/2016   K 3.9 06/14/2016   CL 104 06/14/2016   CREATININE 0.84 06/14/2016   BUN 11 06/14/2016   CO2 31 06/14/2016   TSH 1.60 06/14/2016   HGBA1C 5.1 06/14/2016    Assessment & Plan:   Problem List Items Addressed This Visit    Hypertension - Primary    Establish problem, remains uncontrolled but slowly improving. Increasing Coreg to 12.5 mg BID. F/U in 2 weeks.      Relevant Medications   carvedilol (COREG) 12.5 MG tablet      Meds ordered this encounter  Medications  . carvedilol (COREG) 12.5 MG tablet    Sig: Take 1 tablet (12.5 mg total) by mouth 2 (two) times daily with a meal.    Dispense:  60 tablet    Refill:  3    Follow-up: Return in about 2 weeks (around 07/30/2016).  [place]

## 2016-07-16 NOTE — Patient Instructions (Signed)
I have increased the Coreg.  Continue the Norvasc and the HCTZ.  Follow up in 2 weeks.  Take care  Dr. Lacinda Axon

## 2016-07-30 ENCOUNTER — Telehealth: Payer: Self-pay | Admitting: *Deleted

## 2016-07-30 NOTE — Telephone Encounter (Signed)
Can be a 15 slot on that date per PCP, thanks

## 2016-07-30 NOTE — Telephone Encounter (Signed)
Yes that is fine

## 2016-07-30 NOTE — Telephone Encounter (Signed)
Her follow up is for Hypertension, can it be a 15 min slot? Please advise thanks

## 2016-07-30 NOTE — Telephone Encounter (Signed)
Scheduled

## 2016-07-30 NOTE — Telephone Encounter (Signed)
Patient requested to have her follow up on Aug 7, with Dr. Lacinda Axon. Please give a time , if he could see her this day, this will be her only off day from work.

## 2016-08-02 ENCOUNTER — Ambulatory Visit: Payer: Managed Care, Other (non HMO) | Admitting: Family Medicine

## 2016-08-06 ENCOUNTER — Encounter: Payer: Self-pay | Admitting: Family Medicine

## 2016-08-06 ENCOUNTER — Ambulatory Visit (INDEPENDENT_AMBULATORY_CARE_PROVIDER_SITE_OTHER): Payer: Managed Care, Other (non HMO) | Admitting: Family Medicine

## 2016-08-06 VITALS — BP 149/83 | HR 80 | Temp 98.4°F | Wt 185.5 lb

## 2016-08-06 DIAGNOSIS — Z1159 Encounter for screening for other viral diseases: Secondary | ICD-10-CM

## 2016-08-06 DIAGNOSIS — I10 Essential (primary) hypertension: Secondary | ICD-10-CM | POA: Diagnosis not present

## 2016-08-06 DIAGNOSIS — R300 Dysuria: Secondary | ICD-10-CM | POA: Diagnosis not present

## 2016-08-06 DIAGNOSIS — Z8639 Personal history of other endocrine, nutritional and metabolic disease: Secondary | ICD-10-CM | POA: Diagnosis not present

## 2016-08-06 LAB — POCT URINALYSIS DIPSTICK
BILIRUBIN UA: NEGATIVE
GLUCOSE UA: NEGATIVE
Ketones, UA: NEGATIVE
Leukocytes, UA: NEGATIVE
NITRITE UA: NEGATIVE
Protein, UA: NEGATIVE
RBC UA: NEGATIVE
Spec Grav, UA: <=1.005
UROBILINOGEN UA: NEGATIVE
pH, UA: 6

## 2016-08-06 NOTE — Assessment & Plan Note (Signed)
Established problem, improving. BP markedly improved. No med changes today. Advised daily BP monitoring. Follow up in 1 month.

## 2016-08-06 NOTE — Patient Instructions (Addendum)
Check your blood pressure daily and keep a log.  If they stay elevated >140/90 please let me know as I will need to add another blood pressure agent.  Follow up in 1 month.  Take care  Dr. Lacinda Axon

## 2016-08-06 NOTE — Progress Notes (Signed)
Pre visit review using our clinic review tool, if applicable. No additional management support is needed unless otherwise documented below in the visit note. 

## 2016-08-06 NOTE — Progress Notes (Signed)
Subjective:  Patient ID: Gail Baker, female    DOB: 09/13/1956  Age: 60 y.o. MRN: [id number]  CC: Follow up HTN  HPI:  60 year old female presents for follow up regarding HTN. Also reports burning with urination.  HTN  BP improving.  Compliant with Norvasc, Coreg, HCTZ.   Cut back on sodium.  Watching diet.  Dysuria  Patient has recently been experiencing vaginal burning and burning with urination.  No fever, chills.  No known exacerbating or relieving factors.  No other associated symptoms.  No other complaints today.  Social Hx   Social History   Social History  . Marital status: Single    Spouse name: N/A  . Number of children: N/A  . Years of education: N/A   Social History Main Topics  . Smoking status: Never Smoker  . Smokeless tobacco: Never Used  . Alcohol use No  . Drug use: No  . Sexual activity: Not Asked   Other Topics Concern  . None   Social History Narrative  . None    Review of Systems  Constitutional: Negative.   Respiratory: Negative.   Cardiovascular: Negative.   Genitourinary: Positive for dysuria and vaginal pain.   Objective:  BP (!) 149/83 (BP Location: Left Arm, Patient Position: Sitting, Cuff Size: Large)   Pulse 80   Temp 98.4 F (36.9 C) (Oral)   Wt 185 lb 8 oz (84.1 kg)   SpO2 100%   BMI 32.86 kg/m   BP/Weight 08/06/2016 [id number] 0000000  Systolic BP 123456 123456 0000000  Diastolic BP 83 A999333 90  Wt. (Lbs) 185.5 185.5 187.6  BMI 32.86 32.87 33.24   Physical Exam  Constitutional: She is oriented to person, place, and time. She appears well-developed. No distress.  Cardiovascular: Normal rate and regular rhythm.   Pulmonary/Chest: Effort normal. She has no wheezes. She has no rales.  Neurological: She is alert and oriented to person, place, and time.  Psychiatric: She has a normal mood and affect.  Vitals reviewed.  Lab Results  Component Value Date   WBC 3.7 (L) 06/14/2016   HGB 12.2 06/14/2016   HCT  36.5 06/14/2016   PLT 221.0 06/14/2016   GLUCOSE 110 (H) 06/14/2016   CHOL 193 06/14/2016   TRIG 37.0 06/14/2016   HDL 63.20 06/14/2016   LDLCALC 122 (H) 06/14/2016   ALT 12 06/14/2016   AST 19 06/14/2016   NA 140 06/14/2016   K 3.9 06/14/2016   CL 104 06/14/2016   CREATININE 0.84 06/14/2016   BUN 11 06/14/2016   CO2 31 06/14/2016   TSH 1.60 06/14/2016   HGBA1C 5.1 06/14/2016    Assessment & Plan:   Problem List Items Addressed This Visit    Dysuria    New acute issue. UA negative today. Symptoms likely from atrophic vaginitis. Will continue to monitor closely.       Relevant Orders   POCT Urinalysis Dipstick (Completed)   Hypertension    Established problem, improving. BP markedly improved. No med changes today. Advised daily BP monitoring. Follow up in 1 month.       Other Visit Diagnoses    History of hyperthyroidism    -  Primary   Relevant Orders   TSH   T4, free   T3, free   Need for hepatitis C screening test       Relevant Orders   Hepatitis C Antibody     Follow-up: Return in about 1 month (around 09/06/2016) for HTN  follow up.  [place]

## 2016-08-06 NOTE — Assessment & Plan Note (Signed)
New acute issue. UA negative today. Symptoms likely from atrophic vaginitis. Will continue to monitor closely.

## 2016-08-07 LAB — T4, FREE: Free T4: 1.04 ng/dL (ref 0.60–1.60)

## 2016-08-07 LAB — HEPATITIS C ANTIBODY: HCV AB: NEGATIVE

## 2016-08-07 LAB — T3, FREE: T3, Free: 2.5 pg/mL (ref 2.3–4.2)

## 2016-08-07 LAB — TSH: TSH: 0.88 u[IU]/mL (ref 0.35–4.50)

## 2016-08-10 ENCOUNTER — Telehealth: Payer: Self-pay | Admitting: *Deleted

## 2016-08-10 NOTE — Telephone Encounter (Signed)
[facility]leas let patient know that I have mailed her results

## 2016-08-10 NOTE — Telephone Encounter (Signed)
Patient has requested a call from [place]. She stated that she received her lab report, all labs were ok,however she has further questions to ask.  Pt contact [phone]

## 2016-08-10 NOTE — Telephone Encounter (Signed)
[facility]

## 2016-08-15 ENCOUNTER — Other Ambulatory Visit: Payer: Self-pay | Admitting: Family Medicine

## 2016-08-15 DIAGNOSIS — Z8639 Personal history of other endocrine, nutritional and metabolic disease: Secondary | ICD-10-CM

## 2016-08-15 NOTE — Telephone Encounter (Signed)
Pt called back stating she has some questions regarding her lab results. Please advise?   Call pt @ [phone]. Thank you!

## 2016-08-15 NOTE — Telephone Encounter (Signed)
Patient was called and stated that she is has concerns with weight loss and is unclear what could be causing this.

## 2016-09-05 ENCOUNTER — Telehealth: Payer: Self-pay | Admitting: Family Medicine

## 2016-09-05 NOTE — Telephone Encounter (Signed)
Pt returning Gail Baker call [facility] number [phone]

## 2016-09-10 ENCOUNTER — Encounter: Payer: Self-pay | Admitting: Family Medicine

## 2016-09-10 ENCOUNTER — Ambulatory Visit (INDEPENDENT_AMBULATORY_CARE_PROVIDER_SITE_OTHER): Payer: Managed Care, Other (non HMO) | Admitting: Family Medicine

## 2016-09-10 VITALS — BP 132/82 | HR 85 | Temp 98.4°F | Wt 181.2 lb

## 2016-09-10 DIAGNOSIS — K921 Melena: Secondary | ICD-10-CM | POA: Diagnosis not present

## 2016-09-10 DIAGNOSIS — R634 Abnormal weight loss: Secondary | ICD-10-CM

## 2016-09-10 DIAGNOSIS — I10 Essential (primary) hypertension: Secondary | ICD-10-CM

## 2016-09-10 LAB — TSH: TSH: 0.73 u[IU]/mL (ref 0.35–4.50)

## 2016-09-10 LAB — C-REACTIVE PROTEIN: CRP: 0.1 mg/dL — ABNORMAL LOW (ref 0.5–20.0)

## 2016-09-10 LAB — T4, FREE: FREE T4: 1.15 ng/dL (ref 0.60–1.60)

## 2016-09-10 LAB — SEDIMENTATION RATE: Sed Rate: 40 mm/hr — ABNORMAL HIGH (ref 0–30)

## 2016-09-10 LAB — T3, FREE: T3, Free: 2.9 pg/mL (ref 2.3–4.2)

## 2016-09-10 LAB — HIV ANTIBODY (ROUTINE TESTING W REFLEX): HIV: NONREACTIVE

## 2016-09-10 NOTE — Assessment & Plan Note (Addendum)
New problem. Unclear etiology and prognosis at this time. Labs today - CRP, ESR, HIV, Repeat thyroid studies. Cancer screening up to date, except for colonoscopy. Arranging for colonoscopy.

## 2016-09-10 NOTE — Progress Notes (Signed)
Subjective:  Patient ID: Gail Baker, female    DOB: 06-09-1956  Age: 60 y.o. MRN: [id number]  CC: Follow up HTN, continued weight loss  HPI:  60 year old female with HTN presents for follow up. Additional concern: weight loss.  HTN  Stable, at goal on HCTZ, Coreg, Norvasc.  Patient is compliant.  Weight loss  Patient Has had ongoing weight loss since May.  Patient states that her weight loss has been unintentional.  Initially, her weight loss was thought to be secondary to uncontrolled hyperthyroidism. She has now been off medication and her thyroid studies have been normal.  Patient states that she's changed her diet but still eats well. She states that she has a healthy appetite.  Patient is not exercising regularly.  No known reason for her to be losing weight.  She has had Pap smear and mammogram this year.  Of note, approximately 1 week ago she had an episode of hematochezia. She states that it had some mucus as well.  She denies any abdominal pain.  No fevers or chills. No night sweats. No other complaints at this time. No other associated symptoms.  Social Hx   Social History   Social History  . Marital status: Single    Spouse name: N/A  . Number of children: N/A  . Years of education: N/A   Social History Main Topics  . Smoking status: Never Smoker  . Smokeless tobacco: Never Used  . Alcohol use No  . Drug use: No  . Sexual activity: Not Asked   Other Topics Concern  . None   Social History Narrative  . None    Review of Systems  Constitutional: Positive for unexpected weight change.  Gastrointestinal: Positive for blood in stool.   Objective:  BP 132/82 (BP Location: Left Arm, Patient Position: Sitting, Cuff Size: Large)   Pulse 85   Temp 98.4 F (36.9 C) (Oral)   Wt 181 lb 4 oz (82.2 kg)   SpO2 97%   BMI 32.11 kg/m   BP/Weight 09/10/2016 08/06/2016 0/62/3762  Systolic BP 831 517 616  Diastolic BP 82 83 073  Wt. (Lbs) 181.25  185.5 185.5  BMI 32.11 32.86 32.87    Physical Exam  Constitutional: She is oriented to person, place, and time. She appears well-developed. No distress.  Cardiovascular: Normal rate and regular rhythm.   Pulmonary/Chest: Effort normal and breath sounds normal.  Abdominal: Soft. She exhibits no distension. There is no tenderness. There is no rebound and no guarding.  Neurological: She is alert and oriented to person, place, and time.  Psychiatric: She has a normal mood and affect.  Vitals reviewed.  Lab Results  Component Value Date   WBC 3.7 (L) 06/14/2016   HGB 12.2 06/14/2016   HCT 36.5 06/14/2016   PLT 221.0 06/14/2016   GLUCOSE 110 (H) 06/14/2016   CHOL 193 06/14/2016   TRIG 37.0 06/14/2016   HDL 63.20 06/14/2016   LDLCALC 122 (H) 06/14/2016   ALT 12 06/14/2016   AST 19 06/14/2016   NA 140 06/14/2016   K 3.9 06/14/2016   CL 104 06/14/2016   CREATININE 0.84 06/14/2016   BUN 11 06/14/2016   CO2 31 06/14/2016   TSH 0.88 08/06/2016   HGBA1C 5.1 06/14/2016    Assessment & Plan:   Problem List Items Addressed This Visit    Hypertension - Primary    Established problem, stable. At goal. Continue coreg, HCTZ, norvasc.      Unintentional  weight loss    New problem. Unclear etiology and prognosis at this time. Labs today - CRP, ESR, HIV, Repeat thyroid studies. Cancer screening up to date, except for colonoscopy. Arranging for colonoscopy.       Relevant Orders   Sed Rate (ESR)   C-reactive protein   Ambulatory referral to Gastroenterology   HIV antibody (with reflex)   TSH   T3, free   T4, free   Hematochezia    In setting of weight loss, referring for colonoscopy.        Other Visit Diagnoses   None.     No orders of the defined types were placed in this encounter.   Follow-up: No Follow-up on file.  Jayce Cook DO [facility] [place]  

## 2016-09-10 NOTE — Assessment & Plan Note (Signed)
Established problem, stable. At goal. Continue coreg, HCTZ, norvasc.

## 2016-09-10 NOTE — Patient Instructions (Signed)
Continue your current medications.  Follow up 1-3 months.  Take care  Dr. Lacinda Axon

## 2016-09-10 NOTE — Assessment & Plan Note (Signed)
In setting of weight loss, referring for colonoscopy.

## 2016-09-10 NOTE — Progress Notes (Signed)
Pre visit review using our clinic review tool, if applicable. No additional management support is needed unless otherwise documented below in the visit note. 

## 2016-09-11 ENCOUNTER — Encounter: Payer: Self-pay | Admitting: Endocrinology

## 2016-09-18 ENCOUNTER — Encounter: Payer: Self-pay | Admitting: Internal Medicine

## 2016-09-24 ENCOUNTER — Ambulatory Visit: Payer: Managed Care, Other (non HMO) | Admitting: Endocrinology

## 2016-10-23 ENCOUNTER — Ambulatory Visit (INDEPENDENT_AMBULATORY_CARE_PROVIDER_SITE_OTHER): Payer: Managed Care, Other (non HMO) | Admitting: Internal Medicine

## 2016-10-23 ENCOUNTER — Encounter (INDEPENDENT_AMBULATORY_CARE_PROVIDER_SITE_OTHER): Payer: Self-pay

## 2016-10-23 ENCOUNTER — Encounter: Payer: Self-pay | Admitting: Internal Medicine

## 2016-10-23 VITALS — BP 136/84 | HR 78 | Ht 62.5 in | Wt 175.2 lb

## 2016-10-23 DIAGNOSIS — R634 Abnormal weight loss: Secondary | ICD-10-CM | POA: Diagnosis not present

## 2016-10-23 DIAGNOSIS — K921 Melena: Secondary | ICD-10-CM

## 2016-10-23 MED ORDER — NA SULFATE-K SULFATE-MG SULF 17.5-3.13-1.6 GM/177ML PO SOLN: ORAL | 0 refills | Status: DC

## 2016-10-23 NOTE — Patient Instructions (Signed)
You have been scheduled for an endoscopy and colonoscopy. Please follow the written instructions given to you at your visit today. Please pick up your prep supplies at the pharmacy within the next 1-3 days. If you use inhalers (even only as needed), please bring them with you on the day of your procedure. Your physician has requested that you go to www.startemmi.com and enter the access code given to you at your visit today. This web site gives a general overview about your procedure. However, you should still follow specific instructions given to you by our office regarding your preparation for the procedure.  Please purchase the following medications over the counter and take as directed: Colace 100-200 mg every night.  If you are age 2 or older, your body mass index should be between 23-30. Your Body mass index is 31.54 kg/m. If this is out of the aforementioned range listed, please consider follow up with your Primary Care Provider.  If you are age 62 or younger, your body mass index should be between 19-25. Your Body mass index is 31.54 kg/m. If this is out of the aformentioned range listed, please consider follow up with your Primary Care Provider.

## 2016-10-23 NOTE — Progress Notes (Signed)
Patient ID: Gail Baker, female   DOB: 1956-04-27, 60 y.o.   MRN: [id number] HPI: Gail Baker is a 61 year old female with a history of hypertension and hyperthyroidism who is seen in consultation at the request of Dr. Lacinda Axon to evaluate unexplained weight loss and also intermittent blood in stool. She is here alone today. She reports that since the summer of this year she has lost about 25 pounds unintentionally. This is despite having a good appetite. She has noticed some intermittent right upper and right middle quadrant abdominal pain which seems to be worse with sitting and movement. It doesn't relate to eating. She's noticed occasional blood and mucus in her stool which she has associated with milk intake. She stopped the milk and it has mostly gone away though occasionally she still notices it. She does feel at times constipation and will occasionally use Dulcolax when she gets "uncomfortable". This is not often. She has noticed some burping, belching and borborygmi. Borborygmi improved after lactose avoidance. She denies nausea, vomiting and early satiety. She denies dysphagia and odynophagia. She reports in [place] being treated for what sounds like hyperthyroidism. She stopped medication that she was previously taking and noticed weight loss. Recent thyroid testing was normal performed by primary care.  She denies a family history of GI tract malignancy in IBD. She has never had colonoscopy.  Past Medical History:  Diagnosis Date  . Asthma    Not active. Asthma as a child.  Marland Kitchen History of UTI   . Hypertension   . Hyperthyroidism     Past Surgical History:  Procedure Laterality Date  . CESAREAN SECTION      Outpatient Medications Prior to Visit  Medication Sig Dispense Refill  . amLODipine (NORVASC) 10 MG tablet Take 1 tablet (10 mg total) by mouth daily. 90 tablet 3  . carvedilol (COREG) 12.5 MG tablet Take 1 tablet (12.5 mg total) by mouth 2 (two) times daily with a meal. 60 tablet  3  . hydrochlorothiazide (HYDRODIURIL) 25 MG tablet Take 1 tablet (25 mg total) by mouth daily. 90 tablet 3  . meloxicam (MOBIC) 15 MG tablet Take 1 tablet (15 mg total) by mouth daily. 30 tablet 0   No facility-administered medications prior to visit.     No Known Allergies  Family History  Problem Relation Age of Onset  . Hypertension Mother   . Hypertension Father   . Diabetes Father     Social History  Substance Use Topics  . Smoking status: Never Smoker  . Smokeless tobacco: Never Used  . Alcohol use No    ROS: As per history of present illness, otherwise negative  BP 136/84   Pulse 78   Ht 5' 2.5" (1.588 m)   Wt 175 lb 4 oz (79.5 kg)   BMI 31.54 kg/m  Constitutional: Well-developed and well-nourished. No distress. HEENT: Normocephalic and atraumatic. Oropharynx is clear and moist. No oropharyngeal exudate. Conjunctivae are normal.  No scleral icterus. Neck: Neck supple. Trachea midline. Cardiovascular: Normal rate, regular rhythm and intact distal pulses. No M/R/G Pulmonary/chest: Effort normal and breath sounds normal. No wheezing, rales or rhonchi. Abdominal: Soft, nontender, nondistended. Bowel sounds active throughout. There are no masses palpable. No hepatosplenomegaly. Extremities: no clubbing, cyanosis, or edema Lymphadenopathy: No cervical adenopathy noted. Neurological: Alert and oriented to person place and time. Skin: Skin is warm and dry. No rashes noted. Psychiatric: Normal mood and affect. Behavior is normal.  RELEVANT LABS AND IMAGING: CBC    Component Value Date/Time  WBC 3.7 (L) 06/14/2016 1040   RBC 4.23 06/14/2016 1040   HGB 12.2 06/14/2016 1040   HCT 36.5 06/14/2016 1040   PLT 221.0 06/14/2016 1040   MCV 86.3 06/14/2016 1040   MCHC 33.4 06/14/2016 1040   RDW 14.5 06/14/2016 1040    CMP     Component Value Date/Time   NA 140 06/14/2016 1040   K 3.9 06/14/2016 1040   CL 104 06/14/2016 1040   CO2 31 06/14/2016 1040   GLUCOSE 110  (H) 06/14/2016 1040   BUN 11 06/14/2016 1040   CREATININE 0.84 06/14/2016 1040   CALCIUM 10.2 06/14/2016 1040   PROT 8.2 06/14/2016 1040   ALBUMIN 4.5 06/14/2016 1040   AST 19 06/14/2016 1040   ALT 12 06/14/2016 1040   ALKPHOS 61 06/14/2016 1040   BILITOT 0.6 06/14/2016 1040   Hep C neg  T3, T4 and TSH normal in June and Aug 2017   ASSESSMENT/PLAN:  60 year old female with a history of hypertension and hyperthyroidism who is seen in consultation at the request of Dr. Lacinda Axon to evaluate unexplained weight loss and also intermittent blood in stool  1. Weight loss/intermittent blood in stool/CRC screening -- I recommended upper endoscopy and colonoscopy to evaluate weight loss and also her intermittent blood in stool with mucus. She's also never been screened for colorectal cancer. We discussed both tests include the risk of benefits and alternatives and she wishes to proceed. These will be arranged today. No evidence of anemia which is reassuring. Albumin is normal.  2. Intermittent constipation -- Colace 100-200 mg daily at bedtime recommended as a stool softener. Further recommendations after endoscopies as discussed above      [id number] Bing Neighbors, Do [address] [place], [place] [address]

## 2016-11-12 ENCOUNTER — Ambulatory Visit: Payer: Managed Care, Other (non HMO) | Admitting: Family Medicine

## 2016-11-12 ENCOUNTER — Encounter: Payer: Self-pay | Admitting: Internal Medicine

## 2016-11-15 ENCOUNTER — Telehealth: Payer: Self-pay | Admitting: *Deleted

## 2016-11-15 MED ORDER — AMLODIPINE BESYLATE 10 MG PO TABS: 10.0000 mg | ORAL_TABLET | Freq: Every day | ORAL | 3 refills | Status: DC

## 2016-11-15 NOTE — Telephone Encounter (Signed)
Patient requested a medication  refill for amlodipine  Pharmacy  [facility]

## 2016-11-15 NOTE — Telephone Encounter (Signed)
rx sent

## 2016-11-16 ENCOUNTER — Telehealth: Payer: Self-pay | Admitting: Family Medicine

## 2016-11-16 MED ORDER — CARVEDILOL 12.5 MG PO TABS: 12.5000 mg | ORAL_TABLET | Freq: Two times a day (BID) | ORAL | 3 refills | Status: DC

## 2016-11-16 NOTE — Telephone Encounter (Signed)
rx sent

## 2016-11-16 NOTE — Telephone Encounter (Signed)
Pt called requesting a refill on her carvedilol (COREG) 12.5 MG tablet.  [place], [place]  Call pt @ [phone]

## 2016-11-26 ENCOUNTER — Encounter: Payer: Self-pay | Admitting: Internal Medicine

## 2016-11-26 ENCOUNTER — Ambulatory Visit (AMBULATORY_SURGERY_CENTER): Payer: Managed Care, Other (non HMO) | Admitting: Internal Medicine

## 2016-11-26 VITALS — BP 140/88 | HR 75 | Temp 96.6°F | Resp 15 | Ht 62.5 in | Wt 175.0 lb

## 2016-11-26 DIAGNOSIS — K621 Rectal polyp: Secondary | ICD-10-CM | POA: Diagnosis not present

## 2016-11-26 DIAGNOSIS — D122 Benign neoplasm of ascending colon: Secondary | ICD-10-CM

## 2016-11-26 DIAGNOSIS — K921 Melena: Secondary | ICD-10-CM | POA: Diagnosis not present

## 2016-11-26 DIAGNOSIS — K2951 Unspecified chronic gastritis with bleeding: Secondary | ICD-10-CM | POA: Diagnosis not present

## 2016-11-26 DIAGNOSIS — D125 Benign neoplasm of sigmoid colon: Secondary | ICD-10-CM | POA: Diagnosis not present

## 2016-11-26 DIAGNOSIS — R634 Abnormal weight loss: Secondary | ICD-10-CM

## 2016-11-26 DIAGNOSIS — D128 Benign neoplasm of rectum: Secondary | ICD-10-CM

## 2016-11-26 DIAGNOSIS — B9681 Helicobacter pylori [H. pylori] as the cause of diseases classified elsewhere: Secondary | ICD-10-CM | POA: Diagnosis not present

## 2016-11-26 NOTE — Progress Notes (Signed)
Called to room to assist during endoscopic procedure.  Patient ID and intended procedure confirmed with present staff. Received instructions for my participation in the procedure from the performing physician.  

## 2016-11-26 NOTE — Op Note (Signed)
[place] Patient Name: Gail Baker Procedure Date: 11/26/2016 2:04 PM MRN: [id number] Endoscopist: Jerene Bears , MD Age: 60 Referring MD:  Date of Birth: 1956-08-08 Gender: Female Account #: [id number] Procedure:                Upper GI endoscopy Indications:              Weight loss, rectal bleeding Medicines:                Monitored Anesthesia Care Procedure:                Pre-Anesthesia Assessment:                           - Prior to the procedure, a History and Physical                            was performed, and patient medications and                            allergies were reviewed. The patient's tolerance of                            previous anesthesia was also reviewed. The risks                            and benefits of the procedure and the sedation                            options and risks were discussed with the patient.                            All questions were answered, and informed consent                            was obtained. Prior Anticoagulants: The patient has                            taken no previous anticoagulant or antiplatelet                            agents. ASA Grade Assessment: III - A patient with                            severe systemic disease. After reviewing the risks                            and benefits, the patient was deemed in                            satisfactory condition to undergo the procedure.                           After obtaining informed consent, the endoscope was  passed under direct vision. Throughout the                            procedure, the patient's blood pressure, pulse, and                            oxygen saturations were monitored continuously. The                            Model GIF-HQ190 [id number]) scope was introduced                            through the mouth, and advanced to the second part                            of duodenum. The  upper GI endoscopy was                            accomplished without difficulty. The patient                            tolerated the procedure well. Scope In: Scope Out: Findings:                 The examined esophagus was normal.                           Scattered mild inflammation characterized by                            erythema and linear erosions was found in the                            gastric body. Biopsies were taken with a cold                            forceps for histology and Helicobacter pylori                            testing.                           The cardia and gastric fundus were normal on                            retroflexion.                           The examined duodenum was normal. Complications:            No immediate complications. Estimated Blood Loss:     Estimated blood loss was minimal. Impression:               - Normal esophagus.                           - Gastritis. Biopsied.                           -  Normal examined duodenum. Recommendation:           - Patient has a contact number available for                            emergencies. The signs and symptoms of potential                            delayed complications were discussed with the                            patient. Return to normal activities tomorrow.                            Written discharge instructions were provided to the                            patient.                           - Resume previous diet.                           - Continue present medications.                           - Await pathology results.                           - Perform a colonoscopy today. Jerene Bears, MD 11/26/2016 2:50:49 PM This report has been signed electronically.

## 2016-11-26 NOTE — Patient Instructions (Addendum)
Impression/Recommendations:  Gastritis handout given to patient. Polyp handout given to patient.  No NSAIDS for 2 weeks.   Tylenol only.  Repeat colonoscopy based on pathology results for surveillance.  Recommend CT scan of abdomen and pelvis for complete evaluation. Contrast given to patient, with instructions to await contact from Dr. Vena Rua office nurse for further instructions for CT scan.  YOU HAD AN ENDOSCOPIC PROCEDURE TODAY AT [facility]:   Refer to the procedure report that was given to you for any specific questions about what was found during the examination.  If the procedure report does not answer your questions, please call your gastroenterologist to clarify.  If you requested that your care partner not be given the details of your procedure findings, then the procedure report has been included in a sealed envelope for you to review at your convenience later.  YOU SHOULD EXPECT: Some feelings of bloating in the abdomen. Passage of more gas than usual.  Walking can help get rid of the air that was put into your GI tract during the procedure and reduce the bloating. If you had a lower endoscopy (such as a colonoscopy or flexible sigmoidoscopy) you may notice spotting of blood in your stool or on the toilet paper. If you underwent a bowel prep for your procedure, you may not have a normal bowel movement for a few days.  Please Note:  You might notice some irritation and congestion in your nose or some drainage.  This is from the oxygen used during your procedure.  There is no need for concern and it should clear up in a day or so.  SYMPTOMS TO REPORT IMMEDIATELY:   Following lower endoscopy (colonoscopy or flexible sigmoidoscopy):  Excessive amounts of blood in the stool  Significant tenderness or worsening of abdominal pains  Swelling of the abdomen that is new, acute  Fever of 100F or higher   Following upper endoscopy (EGD)  Vomiting of blood or coffee  ground material  New chest pain or pain under the shoulder blades  Painful or persistently difficult swallowing  New shortness of breath  Fever of 100F or higher  Black, tarry-looking stools  For urgent or emergent issues, a gastroenterologist can be reached at any hour by calling [phone].   DIET:  We do recommend a small meal at first, but then you may proceed to your regular diet.  Drink plenty of fluids but you should avoid alcoholic beverages for 24 hours.  ACTIVITY:  You should plan to take it easy for the rest of today and you should NOT DRIVE or use heavy machinery until tomorrow (because of the sedation medicines used during the test).    FOLLOW UP: Our staff will call the number listed on your records the next business day following your procedure to check on you and address any questions or concerns that you may have regarding the information given to you following your procedure. If we do not reach you, we will leave a message.  However, if you are feeling well and you are not experiencing any problems, there is no need to return our call.  We will assume that you have returned to your regular daily activities without incident.  If any biopsies were taken you will be contacted by phone or by letter within the next 1-3 weeks.  Please call us at [phone] if you have not heard about the biopsies in 3 weeks.    SIGNATURES/CONFIDENTIALITY: You and/or your care partner  have signed paperwork which will be entered into your electronic medical record.  These signatures attest to the fact that that the information above on your After Visit Summary has been reviewed and is understood.  Full responsibility of the confidentiality of this discharge information lies with you and/or your care-partner.

## 2016-11-26 NOTE — Progress Notes (Signed)
Report to PACU, RN, vss, BBS= Clear.  

## 2016-11-26 NOTE — Op Note (Signed)
[place] Patient Name: Gail Baker Procedure Date: 11/26/2016 2:02 PM MRN: [id number] Endoscopist: Jerene Bears , MD Age: 60 Referring MD:  Date of Birth: [birth year] Gender: Female Account #: [id number] Procedure:                Colonoscopy Indications:              This is the patient's first colonoscopy, Rectal                            bleeding, Weight loss Medicines:                Monitored Anesthesia Care Procedure:                Pre-Anesthesia Assessment:                           - Prior to the procedure, a History and Physical                            was performed, and patient medications and                            allergies were reviewed. The patient's tolerance of                            previous anesthesia was also reviewed. The risks                            and benefits of the procedure and the sedation                            options and risks were discussed with the patient.                            All questions were answered, and informed consent                            was obtained. Prior Anticoagulants: The patient has                            taken no previous anticoagulant or antiplatelet                            agents. ASA Grade Assessment: III - A patient with                            severe systemic disease. After reviewing the risks                            and benefits, the patient was deemed in                            satisfactory condition to undergo the procedure.  After obtaining informed consent, the colonoscope                            was passed under direct vision. Throughout the                            procedure, the patient's blood pressure, pulse, and                            oxygen saturations were monitored continuously. The                            Model PCF-H190L [id number]) scope was introduced                            through the anus and advanced to the  the cecum,                            identified by appendiceal orifice and ileocecal                            valve. The colonoscopy was somewhat difficult due                            to significant looping. Successful completion of                            the procedure was aided by applying abdominal                            pressure. The patient tolerated the procedure well.                            The quality of the bowel preparation was good. The                            ileocecal valve, appendiceal orifice, and rectum                            were photographed. Scope In: 2:17:18 PM Scope Out: [id number] PM Scope Withdrawal Time: 0 hours 20 minutes 31 seconds  Total Procedure Duration: 0 hours 29 minutes 14 seconds  Findings:                 The digital rectal exam was normal.                           A 5 mm polyp was found in the ascending colon. The                            polyp was sessile. The polyp was removed with a                            cold snare. Resection and retrieval were  complete.                           A 4 mm polyp was found in the ascending colon. The                            polyp was sessile. The polyp was removed with a                            cold biopsy forceps. Resection and retrieval were                            complete.                           Two pedunculated polyps were found in the sigmoid                            colon. The polyps were 7 to 10 mm in size. These                            polyps were removed with a hot snare. Resection and                            retrieval were complete.                           A 3 mm polyp was found in the rectum. The polyp was                            sessile. The polyp was removed with a cold biopsy                            forceps. Resection and retrieval were complete.                           The retroflexed view of the distal rectum and anal                             verge was normal and showed no anal or rectal                            abnormalities. Complications:            No immediate complications. Estimated Blood Loss:     Estimated blood loss was minimal. Impression:               - One 5 mm polyp in the ascending colon, removed                            with a cold snare. Resected and retrieved.                           - One 4 mm polyp in the  ascending colon, removed                            with a cold biopsy forceps. Resected and retrieved.                           - Two 7 to 10 mm polyps in the sigmoid colon,                            removed with a hot snare. Resected and retrieved.                           - One 3 mm polyp in the rectum, removed with a cold                            biopsy forceps. Resected and retrieved.                           - The distal rectum and anal verge are normal on                            retroflexion view. Recommendation:           - Patient has a contact number available for                            emergencies. The signs and symptoms of potential                            delayed complications were discussed with the                            patient. Return to normal activities tomorrow.                            Written discharge instructions were provided to the                            patient.                           - Resume previous diet.                           - Continue present medications.                           - Await pathology results.                           - Repeat colonoscopy is recommended for                            surveillance. The colonoscopy date will be  determined after pathology results from today's                            exam become available for review.                           - No NSAIDs for 2 weeks.                           - Given weight loss, would recommend CT scan                            abd/pelvis to  complete GI evaluation. Jerene Bears, MD 11/26/2016 2:59:39 PM This report has been signed electronically.

## 2016-11-27 ENCOUNTER — Telehealth: Payer: Self-pay | Admitting: *Deleted

## 2016-11-27 NOTE — Telephone Encounter (Signed)
  Follow up Call-  Call back number 11/26/2016  Post procedure Call Back phone  # [phone]  Permission to leave phone message Yes     Patient questions:  Do you have a fever, pain , or abdominal swelling? No. Pain Score  0 *  Have you tolerated food without any problems? Yes.    Have you been able to return to your normal activities? Yes.    Do you have any questions about your discharge instructions: Diet   No. Medications  No. Follow up visit  No.  Do you have questions or concerns about your Care? No.  Actions: * If pain score is 4 or above: No action needed, pain <4.

## 2016-11-30 ENCOUNTER — Encounter: Payer: Self-pay | Admitting: Internal Medicine

## 2016-12-03 ENCOUNTER — Ambulatory Visit (INDEPENDENT_AMBULATORY_CARE_PROVIDER_SITE_OTHER): Payer: Managed Care, Other (non HMO) | Admitting: Family Medicine

## 2016-12-03 ENCOUNTER — Other Ambulatory Visit: Payer: Self-pay

## 2016-12-03 ENCOUNTER — Encounter: Payer: Self-pay | Admitting: Family Medicine

## 2016-12-03 VITALS — BP 130/74 | HR 72 | Temp 98.6°F | Resp 14 | Wt 173.1 lb

## 2016-12-03 DIAGNOSIS — I10 Essential (primary) hypertension: Secondary | ICD-10-CM

## 2016-12-03 DIAGNOSIS — M546 Pain in thoracic spine: Secondary | ICD-10-CM

## 2016-12-03 DIAGNOSIS — R634 Abnormal weight loss: Secondary | ICD-10-CM

## 2016-12-03 DIAGNOSIS — B9681 Helicobacter pylori [H. pylori] as the cause of diseases classified elsewhere: Secondary | ICD-10-CM

## 2016-12-03 DIAGNOSIS — N952 Postmenopausal atrophic vaginitis: Secondary | ICD-10-CM | POA: Diagnosis not present

## 2016-12-03 DIAGNOSIS — K2970 Gastritis, unspecified, without bleeding: Secondary | ICD-10-CM

## 2016-12-03 MED ORDER — TRAMADOL HCL 50 MG PO TABS: 50.0000 mg | ORAL_TABLET | Freq: Two times a day (BID) | ORAL | 0 refills | Status: DC | PRN

## 2016-12-03 MED ORDER — BIS SUBCIT-METRONID-TETRACYC 140-125-125 MG PO CAPS: 3.0000 | ORAL_CAPSULE | Freq: Three times a day (TID) | ORAL | 0 refills | Status: DC

## 2016-12-03 MED ORDER — OMEPRAZOLE 20 MG PO CPDR: 20.0000 mg | DELAYED_RELEASE_CAPSULE | Freq: Two times a day (BID) | ORAL | 0 refills | Status: DC

## 2016-12-03 MED ORDER — ESTROGENS, CONJUGATED 0.625 MG/GM VA CREA: 1.0000 | TOPICAL_CREAM | Freq: Every day | VAGINAL | 12 refills | Status: DC

## 2016-12-03 NOTE — Assessment & Plan Note (Signed)
At goal. Continue Norvasc, HCTZ, Coreg.

## 2016-12-03 NOTE — Assessment & Plan Note (Signed)
New problem.  MSK in origin. Treating with PRN Tramadol.

## 2016-12-03 NOTE — Progress Notes (Signed)
Subjective:  Patient ID: Gail Baker, female    DOB: 1956-01-29  Age: 60 y.o. MRN: [id number]  CC: Follow up BP and Weight loss  HPI:  60 year female with HTN and ongoing weight loss presents for follow up. She has additional complaints of thoracic back pain and vaginal irritation/itching.  HTN  At goal on amlodipine, HCTZ, Coreg.  Unintentional weight loss  Has now had colonoscopy and endoscopy. The remainder of her cancer screenings are up-to-date.  Polyps were removed and were adenomas.  Biopsies from endoscopy revealed H. Pylori.  She has not yet started on treatment. I received a message from her GI physician. We'll plan to start treatment regarding H. pylori today.  GI has recommended a CT scan regarding unintentional weight loss.  She continues to lose weight. Her weight is down almost an additional 2 pounds today.  Back pain  Patient reports intermittent thoracic back pain.  Located between the shoulder blades.  Moderate in severity.  No known relieving factors.  Likely exacerbated by work.  No other associated symptoms.  Vaginal irritation/itching  Prior exam revealed atrophy. No other findings on wet prep.  She continues to have issues regarding this.  She would like to discuss treatment options today.  Social Hx   Social History   Social History  . Marital status: Single    Spouse name: N/A  . Number of children: 1  . Years of education: N/A   Social History Main Topics  . Smoking status: Never Smoker  . Smokeless tobacco: Never Used  . Alcohol use No  . Drug use: No  . Sexual activity: Not Asked   Other Topics Concern  . None   Social History Narrative  . None    Review of Systems  Constitutional: Positive for unexpected weight change.  Gastrointestinal: Negative.    Objective:  BP 130/74 (BP Location: Left Arm, Patient Position: Sitting, Cuff Size: Large)   Pulse 72   Temp 98.6 F (37 C) (Oral)   Resp 14   Wt 173  lb 2 oz (78.5 kg)   SpO2 99%   BMI 31.16 kg/m   BP/Weight 12/03/2016 11/26/2016 [id number]  Systolic BP [id number]  Diastolic BP 74 88 84  Wt. (Lbs) 173.13 175 175.25  BMI 31.16 31.5 31.54   Physical Exam  Constitutional: She is oriented to person, place, and time. She appears well-developed. No distress.  Cardiovascular: Normal rate and regular rhythm.   Pulmonary/Chest: Effort normal and breath sounds normal.  Musculoskeletal:  Thoracic spine nontender. No areas of spasm.  Neurological: She is alert and oriented to person, place, and time.  Psychiatric: She has a normal mood and affect.  Vitals reviewed.  Lab Results  Component Value Date   WBC 3.7 (L) 06/14/2016   HGB 12.2 06/14/2016   HCT 36.5 06/14/2016   PLT 221.0 06/14/2016   GLUCOSE 110 (H) 06/14/2016   CHOL 193 06/14/2016   TRIG 37.0 06/14/2016   HDL 63.20 06/14/2016   LDLCALC 122 (H) 06/14/2016   ALT 12 06/14/2016   AST 19 06/14/2016   NA 140 06/14/2016   K 3.9 06/14/2016   CL 104 06/14/2016   CREATININE 0.84 06/14/2016   BUN 11 06/14/2016   CO2 31 06/14/2016   TSH 0.73 09/10/2016   HGBA1C 5.1 06/14/2016    Assessment & Plan:   Problem List Items Addressed This Visit    Unintentional weight loss    Continuing to lose weight. GI arranging CT.  Treating H pylori.      Thoracic back pain    New problem.  MSK in origin. Treating with PRN Tramadol.       Relevant Medications   traMADol (ULTRAM) 50 MG tablet   Hypertension - Primary    At goal. Continue Norvasc, HCTZ, Coreg.      Helicobacter pylori gastritis    New problem. Sent in treatment per GI - Omeprazole 20 mg BID and Pylera x 10 days.      Atrophic vaginitis    Treating with Premarin.         Meds ordered this encounter  Medications  . bismuth-metronidazole-tetracycline (PYLERA) 140-125-125 MG capsule    Sig: Take 3 capsules by mouth 4 (four) times daily -  before meals and at bedtime.    Dispense:  120 capsule    Refill:   0  . omeprazole (PRILOSEC) 20 MG capsule    Sig: Take 1 capsule (20 mg total) by mouth 2 (two) times daily before a meal.    Dispense:  20 capsule    Refill:  0  . conjugated estrogens (PREMARIN) vaginal cream    Sig: Place 1 Applicatorful vaginally daily.    Dispense:  42.5 g    Refill:  12  . traMADol (ULTRAM) 50 MG tablet    Sig: Take 1 tablet (50 mg total) by mouth every 12 (twelve) hours as needed.    Dispense:  30 tablet    Refill:  0    Follow-up: Return in about 1 month (around 01/03/2017).  [place]

## 2016-12-03 NOTE — Assessment & Plan Note (Signed)
Treating with Premarin.

## 2016-12-03 NOTE — Assessment & Plan Note (Signed)
New problem. Sent in treatment per GI - Omeprazole 20 mg BID and Pylera x 10 days.

## 2016-12-03 NOTE — Patient Instructions (Addendum)
Take the medications as prescribed.  We will call regarding the CT.  Take care  Follow up in 1 month  Dr. Lacinda Axon

## 2016-12-03 NOTE — Assessment & Plan Note (Signed)
Continuing to lose weight. GI arranging CT. Treating H pylori.

## 2016-12-04 ENCOUNTER — Telehealth: Payer: Self-pay

## 2016-12-04 NOTE — Telephone Encounter (Signed)
PA for Pylera completed on cover my meds.

## 2016-12-06 ENCOUNTER — Other Ambulatory Visit (INDEPENDENT_AMBULATORY_CARE_PROVIDER_SITE_OTHER): Payer: Managed Care, Other (non HMO)

## 2016-12-06 ENCOUNTER — Telehealth: Payer: Self-pay | Admitting: Internal Medicine

## 2016-12-06 DIAGNOSIS — R634 Abnormal weight loss: Secondary | ICD-10-CM | POA: Diagnosis not present

## 2016-12-06 LAB — BUN: BUN: 12 mg/dL (ref 6–23)

## 2016-12-06 LAB — CREATININE, SERUM: CREATININE: 0.73 mg/dL (ref 0.40–1.20)

## 2016-12-06 NOTE — Telephone Encounter (Signed)
Orders faxed to requested number

## 2016-12-07 ENCOUNTER — Telehealth: Payer: Self-pay | Admitting: Internal Medicine

## 2016-12-07 NOTE — Telephone Encounter (Signed)
Order was faxed over yesterday evening.

## 2016-12-07 NOTE — Telephone Encounter (Addendum)
Pt called in regards to her Pa on Pylera. I have not received anything but told the pt to call pharmacy to see if they had received an approval.

## 2016-12-10 ENCOUNTER — Other Ambulatory Visit: Payer: Self-pay | Admitting: Family Medicine

## 2016-12-10 ENCOUNTER — Inpatient Hospital Stay: Admission: RE | Admit: 2016-12-10 | Payer: Managed Care, Other (non HMO) | Source: Ambulatory Visit

## 2016-12-11 ENCOUNTER — Encounter: Payer: Self-pay | Admitting: Internal Medicine

## 2016-12-11 ENCOUNTER — Telehealth: Payer: Self-pay | Admitting: *Deleted

## 2016-12-11 NOTE — Telephone Encounter (Signed)
-----   Message from Jerene Bears, MD sent at 12/11/2016  1:16 PM EST ----- Can you help this lady with samples to treat H pylori JMP  ----- Message ----- From: Coral Spikes, DO Sent: 12/11/2016  11:22 AM To: Jerene Bears, MD  Just wanted to let you know that [employer] declined her Pylera.  Thanks  [place]

## 2016-12-11 NOTE — Telephone Encounter (Signed)
Left voicemail for patient to call back. I have placed 14 days of Pylera at the front desk for patient pick up since her insurance would not cover.

## 2016-12-12 NOTE — Telephone Encounter (Signed)
I have left another voicemail for patient to call back. 

## 2016-12-12 NOTE — Telephone Encounter (Signed)
Patient left message w/answering service on 12/11/16 @ 4:07pm stating that she was returning your call

## 2016-12-12 NOTE — Telephone Encounter (Signed)
Left another voicemail for patient to call back.

## 2016-12-12 NOTE — Telephone Encounter (Signed)
I have spoken to patient. She has picked up medication and verbalizes understanding of how to properly take it.

## 2016-12-20 ENCOUNTER — Telehealth: Payer: Self-pay | Admitting: *Deleted

## 2016-12-20 NOTE — Telephone Encounter (Signed)
Dr Hilarie Fredrickson has received patient's CT abd/pelvis performed at [facility]. Per Dr Hilarie Fredrickson, "CT results performed for weight loss. Please let patient know results. No acute findins. No evidence for malignancy. No source of weight loss. Degeerative disc disease of spine. Small benign renal cysts. Small umbilical hernia. We are treating H Pylori found at EGD. Office follow up with me. JMP"  I have left a voicemail for patient to call back.

## 2016-12-20 NOTE — Telephone Encounter (Signed)
I have spoken to patient to advise of Dr Vena Rua interpretation of CT scan. She verbalizes understanding. She is scheduled for follow up with Dr Hilarie Fredrickson on 02/13/17.

## 2017-01-07 ENCOUNTER — Ambulatory Visit (INDEPENDENT_AMBULATORY_CARE_PROVIDER_SITE_OTHER): Payer: Managed Care, Other (non HMO) | Admitting: Family Medicine

## 2017-01-07 ENCOUNTER — Encounter: Payer: Self-pay | Admitting: Family Medicine

## 2017-01-07 VITALS — BP 119/72 | HR 78 | Temp 98.6°F | Resp 14 | Wt 175.8 lb

## 2017-01-07 DIAGNOSIS — R634 Abnormal weight loss: Secondary | ICD-10-CM | POA: Diagnosis not present

## 2017-01-07 DIAGNOSIS — I10 Essential (primary) hypertension: Secondary | ICD-10-CM

## 2017-01-07 DIAGNOSIS — I700 Atherosclerosis of aorta: Secondary | ICD-10-CM | POA: Insufficient documentation

## 2017-01-07 DIAGNOSIS — E785 Hyperlipidemia, unspecified: Secondary | ICD-10-CM | POA: Diagnosis not present

## 2017-01-07 MED ORDER — TRAMADOL HCL 50 MG PO TABS: 50.0000 mg | ORAL_TABLET | Freq: Two times a day (BID) | ORAL | 0 refills | Status: DC | PRN

## 2017-01-07 NOTE — Assessment & Plan Note (Addendum)
Weight stable. Has completed treatment for H. pylori. We'll continue to monitor closely. Is doing well at this time.

## 2017-01-07 NOTE — Assessment & Plan Note (Signed)
New problem. Discussed with patient today. Recommend starting daily aspirin 81 mg. Patient in agreement. Waiting on treatment for lipids at this time.

## 2017-01-07 NOTE — Progress Notes (Signed)
Pre visit review using our clinic review tool, if applicable. No additional management support is needed unless otherwise documented below in the visit note. 

## 2017-01-07 NOTE — Patient Instructions (Addendum)
Calcium 1200 mg daily (including dietary sources).  Vitamin D3 2000 IU Daily.  Continue your other medications.  Consider taking aspirin 81 mg daily (based off of your CT scan showing calcification/atherosclerosis).  Follow up in 6 months  Take care  Dr. Lacinda Axon

## 2017-01-07 NOTE — Assessment & Plan Note (Signed)
Recommend treatment. Deferred until follow up.

## 2017-01-07 NOTE — Assessment & Plan Note (Signed)
At goal. Continue amlodipine, Coreg, HCTZ.

## 2017-01-07 NOTE — Progress Notes (Signed)
Subjective:  Patient ID: Gail Baker, female    DOB: 1956-04-25  Age: 61 y.o. MRN: [id number]  CC: Follow up  HPI:  61 year old female with HTN, HLD, prior H pylori, and unintentional weight loss presents for follow up.  Unintentional weight loss  Weight has been stable.  No further weight loss.  Has completed treatment for H pylori.  Cancer screening up to date.  CT unrevealing as well.  Hypertension  BP at goal on amlodipine, Coreg, and HCTZ.   Atherosclerosis  Recent CT revealed aortoiliac atherosclerosis. Also revealed mild cardiomegaly.  We'll discuss this today.   Hyperlipidemia  Currently untreated. Will discuss potential treatment given above new finding.  Social Hx   Social History   Social History  . Marital status: Single    Spouse name: N/A  . Number of children: 1  . Years of education: N/A   Social History Main Topics  . Smoking status: Never Smoker  . Smokeless tobacco: Never Used  . Alcohol use No  . Drug use: No  . Sexual activity: Not Asked   Other Topics Concern  . None   Social History Narrative  . None    Review of Systems  Constitutional: Negative.   Musculoskeletal: Positive for back pain.   Objective:  BP 119/72   Pulse 78   Temp 98.6 F (37 C) (Oral)   Resp 14   Wt 175 lb 12.8 oz (79.7 kg)   SpO2 94%   BMI 31.64 kg/m   BP/Weight 01/07/2017 12/03/2016 0000000  Systolic BP 123456 [id number]  Diastolic BP 72 74 88  Wt. (Lbs) 175.8 173.13 175  BMI 31.64 31.16 31.5   Physical Exam  Constitutional: She is oriented to person, place, and time. She appears well-developed. No distress.  Cardiovascular: Normal rate and regular rhythm.   Pulmonary/Chest: Effort normal and breath sounds normal.  Neurological: She is alert and oriented to person, place, and time.  Psychiatric: She has a normal mood and affect.  Vitals reviewed.  Lab Results  Component Value Date   WBC 3.7 (L) 06/14/2016   HGB 12.2 06/14/2016   HCT  36.5 06/14/2016   PLT 221.0 06/14/2016   GLUCOSE 110 (H) 06/14/2016   CHOL 193 06/14/2016   TRIG 37.0 06/14/2016   HDL 63.20 06/14/2016   LDLCALC 122 (H) 06/14/2016   ALT 12 06/14/2016   AST 19 06/14/2016   NA 140 06/14/2016   K 3.9 06/14/2016   CL 104 06/14/2016   CREATININE 0.73 12/06/2016   BUN 12 12/06/2016   CO2 31 06/14/2016   TSH 0.73 09/10/2016   HGBA1C 5.1 06/14/2016   Assessment & Plan:   Problem List Items Addressed This Visit    Unintentional weight loss    Weight stable. Has completed treatment for H. pylori. We'll continue to monitor closely. Is doing well at this time.      Hypertension    At goal. Continue amlodipine, Coreg, HCTZ.      Hyperlipidemia    Recommend treatment. Deferred until follow up.      Atherosclerosis of aorta ([place])    New problem. Discussed with patient today. Recommend starting daily aspirin 81 mg. Patient in agreement. Waiting on treatment for lipids at this time.        Meds ordered this encounter  Medications  . traMADol (ULTRAM) 50 MG tablet    Sig: Take 1 tablet (50 mg total) by mouth every 12 (twelve) hours as needed.  Dispense:  30 tablet    Refill:  0   Follow-up: Return in about 6 months (around 07/07/2017).  [place]

## 2017-01-22 ENCOUNTER — Encounter: Payer: Self-pay | Admitting: *Deleted

## 2017-02-11 ENCOUNTER — Telehealth: Payer: Self-pay | Admitting: Family Medicine

## 2017-02-11 DIAGNOSIS — R928 Other abnormal and inconclusive findings on diagnostic imaging of breast: Secondary | ICD-10-CM

## 2017-02-11 NOTE — Telephone Encounter (Signed)
Pt called saying that [place] told her to call our office to order a secondary diagnostic mammogram in for a 6 month follow up.

## 2017-02-13 ENCOUNTER — Ambulatory Visit (INDEPENDENT_AMBULATORY_CARE_PROVIDER_SITE_OTHER): Payer: Managed Care, Other (non HMO) | Admitting: Internal Medicine

## 2017-02-13 ENCOUNTER — Encounter: Payer: Self-pay | Admitting: Internal Medicine

## 2017-02-13 VITALS — BP 150/86 | HR 84 | Ht 62.5 in | Wt 178.1 lb

## 2017-02-13 DIAGNOSIS — K5900 Constipation, unspecified: Secondary | ICD-10-CM

## 2017-02-13 DIAGNOSIS — D126 Benign neoplasm of colon, unspecified: Secondary | ICD-10-CM | POA: Diagnosis not present

## 2017-02-13 DIAGNOSIS — R1010 Upper abdominal pain, unspecified: Secondary | ICD-10-CM

## 2017-02-13 DIAGNOSIS — A048 Other specified bacterial intestinal infections: Secondary | ICD-10-CM

## 2017-02-13 NOTE — Patient Instructions (Addendum)
Discontinue omeprazole for 5-7 days. Once you have been off of this medication for this length of time, please return to our lab for some stool studies.  Your physician has requested that you go to the basement for the following lab work after being off omeprazole 5-7 days: H Pylori stool studies   If you feel like you need to resume the omeprazole after returning your stool specimen, let us know and we will send a new prescription.  Discontinue calcium supplements. See if your constipation improves. If not, let us know.  If you are age 51 or older, your body mass index should be between 23-30. Your Body mass index is 32.06 kg/m. If this is out of the aforementioned range listed, please consider follow up with your Primary Care Provider.  If you are age 36 or younger, your body mass index should be between 19-25. Your Body mass index is 32.06 kg/m. If this is out of the aformentioned range listed, please consider follow up with your Primary Care Provider.

## 2017-02-13 NOTE — Progress Notes (Signed)
Subjective:    Patient ID: Gail Baker, female    DOB: 09-01-1956, 61 y.o.   MRN: [id number]  HPI Gail Baker is a 61 year old female with a history of H. pylori status post treatment with Pylera, adenomatous colon polyps, hypertension and hyperthyroidism who returns for follow-up. She was initially seen in October 2017 to evaluate unexplained weight loss and intermittent blood in stool. She returns alone today.  Her workup included upper endoscopy and colonoscopy. These tests were performed on 11/26/2016. EGD revealed linear erosions in the gastric body which were biopsied. The exam was otherwise normal. These biopsies showed chronic H. pylori gastritis. Colonoscopy performed on the same day revealed 5 polyps ranging in size from 3-10 mm. these polyps were removed and of mixed type. 3 were tubular adenoma all the other were benign polypoid colorectal mucosa.  She was treated with Pylera and twice a day PPI which she completed.  We also performed a CT scan of her abdomen and pelvis with contrast to evaluate her weight loss. This was performed outside the system but I reviewed the report. It showed prominent stool throughout the colon favoring constipation. Degenerative disc disease in the lumbar spine. Multiple bilateral renal cysts which were felt to be benign. Mild cardiomegaly. Aortoiliac atherosclerotic vascular disease and a small umbilical hernia containing fat.  She reports that she had been feeling very well. Her appetite has improved. She was recently started on calcium and vitamin D. She also begin aspirin therapy. Since starting the calcium she's noticed some return of her upper abdominal discomfort which had improved. She was having no constipation but with the calcium she's had some recurrent constipation and the pain has returned. Appetite is been much better and she is gained 3 pounds. She denies blood in her stool or melena.  Review of Systems As per history of present  illness, otherwise negative  Current Medications, Allergies, Past Medical History, Past Surgical History, Family History and Social History were reviewed in [employer] record.     Objective:   Physical Exam BP (!) 150/86   Pulse 84   Ht 5' 2.5" (1.588 m)   Wt 178 lb 2 oz (80.8 kg)   BMI 32.06 kg/m  Constitutional: Well-developed and well-nourished. No distress. HEENT: Normocephalic and atraumatic.Conjunctivae are normal.  No scleral icterus. Neck: Neck supple. Trachea midline. Cardiovascular: Normal rate, regular rhythm and intact distal pulses. Pulmonary/chest: Effort normal and breath sounds normal. No wheezing, rales or rhonchi. Abdominal: Soft, nontender, nondistended. Bowel sounds active throughout. There are no masses palpable. No hepatosplenomegaly. Extremities: no clubbing, cyanosis,Trace pretibial edema Neurological: Alert and oriented to person place and time. Skin: Skin is warm and dry.  Psychiatric: Normal mood and affect. Behavior is normal.  CT abd/pelvis -- see HPI CBC    Component Value Date/Time   WBC 3.7 (L) 06/14/2016 1040   RBC 4.23 06/14/2016 1040   HGB 12.2 06/14/2016 1040   HCT 36.5 06/14/2016 1040   PLT 221.0 06/14/2016 1040   MCV 86.3 06/14/2016 1040   MCHC 33.4 06/14/2016 1040   RDW 14.5 06/14/2016 1040   CMP     Component Value Date/Time   NA 140 06/14/2016 1040   K 3.9 06/14/2016 1040   CL 104 06/14/2016 1040   CO2 31 06/14/2016 1040   GLUCOSE 110 (H) 06/14/2016 1040   BUN 12 12/06/2016 0957   CREATININE 0.73 12/06/2016 0957   CALCIUM 10.2 06/14/2016 1040   PROT 8.2 06/14/2016 1040   ALBUMIN  4.5 06/14/2016 1040   AST 19 06/14/2016 1040   ALT 12 06/14/2016 1040   ALKPHOS 61 06/14/2016 1040   BILITOT 0.6 06/14/2016 1040       Assessment & Plan:  61 year old female with a history of H. pylori status post treatment with Pylera, adenomatous colon polyps, hypertension and hyperthyroidism who returns for  follow-up  1. H. pylori gastritis  -- status post treatment with Pylera. I recommended we hold omeprazole which she is using twice a day currently for 7 days and check H. pylori stool antigen to confirm eradication. She can likely remain off PPI but I asked that should she develop indigestion or upper abdominal discomfort when stopping PPI to notify me. She voiced understanding   2. Mild constipation  -- recurrent and she is relating this to calcium supplementation. This is possible. I asked that she hold calcium to see if constipation improves. She also has some upper abdominal discomfort which she relates to constipation as this was absent when she was having bowel movements 1-2 times daily. If stopping calcium does not improve constipation I suggested she add Colace or MiraLAX. She will call if constipation or upper abdominal pain continued to be an issue.   3. History of colon polyps  -- repeat surveillance colonoscopy recommended in November 2020   She can return as needed  25 minutes spent with the patient today. Greater than 50% was spent in counseling and coordination of care with the patient

## 2017-02-14 ENCOUNTER — Encounter: Payer: Self-pay | Admitting: Family Medicine

## 2017-02-18 ENCOUNTER — Other Ambulatory Visit: Payer: Managed Care, Other (non HMO)

## 2017-02-18 DIAGNOSIS — A048 Other specified bacterial intestinal infections: Secondary | ICD-10-CM

## 2017-02-18 DIAGNOSIS — R1010 Upper abdominal pain, unspecified: Secondary | ICD-10-CM

## 2017-02-19 LAB — HELICOBACTER PYLORI  SPECIAL ANTIGEN: H. PYLORI ANTIGEN STOOL: NOT DETECTED

## 2017-03-06 ENCOUNTER — Ambulatory Visit
Admission: RE | Admit: 2017-03-06 | Discharge: 2017-03-06 | Disposition: A | Discharge status: Home / Self Care | Payer: Managed Care, Other (non HMO) | Source: Ambulatory Visit | Attending: Family Medicine | Admitting: Family Medicine

## 2017-03-06 ENCOUNTER — Other Ambulatory Visit: Payer: Self-pay | Admitting: Family Medicine

## 2017-03-06 DIAGNOSIS — R928 Other abnormal and inconclusive findings on diagnostic imaging of breast: Secondary | ICD-10-CM

## 2017-03-06 DIAGNOSIS — N6313 Unspecified lump in the right breast, lower outer quadrant: Secondary | ICD-10-CM | POA: Insufficient documentation

## 2017-03-06 DIAGNOSIS — R921 Mammographic calcification found on diagnostic imaging of breast: Secondary | ICD-10-CM | POA: Insufficient documentation

## 2017-03-10 ENCOUNTER — Other Ambulatory Visit: Payer: Self-pay | Admitting: Family Medicine

## 2017-03-15 ENCOUNTER — Other Ambulatory Visit: Payer: Self-pay | Admitting: Family Medicine

## 2017-05-03 ENCOUNTER — Other Ambulatory Visit: Payer: Self-pay | Admitting: Family Medicine

## 2017-06-13 ENCOUNTER — Other Ambulatory Visit: Payer: Self-pay | Admitting: Family Medicine

## 2017-06-17 ENCOUNTER — Other Ambulatory Visit: Payer: Self-pay | Admitting: Family Medicine

## 2017-06-26 ENCOUNTER — Telehealth: Payer: Self-pay

## 2017-06-26 DIAGNOSIS — Z1231 Encounter for screening mammogram for malignant neoplasm of breast: Secondary | ICD-10-CM

## 2017-06-26 DIAGNOSIS — N6310 Unspecified lump in the right breast, unspecified quadrant: Secondary | ICD-10-CM

## 2017-06-26 NOTE — Telephone Encounter (Signed)
Patient calls for follow up mammogram appointment . Orders placed. [place] will call patient to schedule appointment .

## 2017-07-08 ENCOUNTER — Ambulatory Visit: Payer: Managed Care, Other (non HMO) | Admitting: Family Medicine

## 2017-07-15 ENCOUNTER — Ambulatory Visit: Payer: Managed Care, Other (non HMO) | Admitting: Family Medicine

## 2017-07-22 ENCOUNTER — Encounter: Payer: Self-pay | Admitting: Family Medicine

## 2017-07-22 ENCOUNTER — Ambulatory Visit (INDEPENDENT_AMBULATORY_CARE_PROVIDER_SITE_OTHER): Payer: Managed Care, Other (non HMO) | Admitting: Family Medicine

## 2017-07-22 DIAGNOSIS — R109 Unspecified abdominal pain: Secondary | ICD-10-CM | POA: Insufficient documentation

## 2017-07-22 DIAGNOSIS — I10 Essential (primary) hypertension: Secondary | ICD-10-CM | POA: Diagnosis not present

## 2017-07-22 DIAGNOSIS — R1010 Upper abdominal pain, unspecified: Secondary | ICD-10-CM

## 2017-07-22 NOTE — Assessment & Plan Note (Signed)
BP elevated today but BP's at home well controlled. Continue Coreg, amlodipine, HCTZ.

## 2017-07-22 NOTE — Patient Instructions (Signed)
Continue your medications.  Follow up in 3-6 months.  Take care  Dr. Brandalynn Ofallon  

## 2017-07-22 NOTE — Assessment & Plan Note (Signed)
New problem. Pain free currently.  No red flags.  Continue omeprazole. If persists, will recommend seeing GI again given history of H pylori.

## 2017-07-22 NOTE — Progress Notes (Signed)
Subjective:  Patient ID: Gail Baker, female    DOB: [birth year]  Age: 61 y.o. MRN: [id number]  CC: Follow up  HPI:  61 year old female with hypertension, hyperlipidemia presents for follow-up.  Hypertension  BP elevated today.  She just recently took her medication.  She endorses compliance with amlodipine, Coreg, HCTZ.  BP at goal at home.  Abdominal pain  Patient reports intermittent abdominal pain over the past month.  Located in the upper abdomen.  She states that it started after she ate some cookies and cakes. She is no longer doing this and has had some improvement.  She endorses compliance with omeprazole.  She has a history of H. pylori.  No reports of nausea or vomiting. No heartburn. No hematochezia or melena.  No other associated symptoms. No other complaints or concerns this time.  Social Hx   Social History   Social History  . Marital status: Single    Spouse name: N/A  . Number of children: 1  . Years of education: N/A   Social History Main Topics  . Smoking status: Never Smoker  . Smokeless tobacco: Never Used  . Alcohol use No  . Drug use: No  . Sexual activity: Not Asked   Other Topics Concern  . None   Social History Narrative  . None    Review of Systems  Constitutional: Negative.   Gastrointestinal: Positive for abdominal pain.   Objective:  BP (!) 164/98 (BP Location: Left Arm, Patient Position: Sitting, Cuff Size: Large)   Pulse 77   Temp 98.6 F (37 C) (Oral)   Wt 185 lb 4 oz (84 kg)   SpO2 97%   BMI 33.34 kg/m   BP/Weight 07/22/2017 0/25/4270 06/01/3761  Systolic BP 831 517 616  Diastolic BP 98 86 72  Wt. (Lbs) 185.25 178.13 175.8  BMI 33.34 32.06 31.64    Physical Exam  Constitutional: She is oriented to person, place, and time. She appears well-developed. No distress.  Cardiovascular: Normal rate and regular rhythm.   Pulmonary/Chest: Effort normal. She has no wheezes. She has no rales.  Abdominal:  Soft. She exhibits no distension.  Mild epigastric tenderness.  Neurological: She is alert and oriented to person, place, and time.  Psychiatric: She has a normal mood and affect.  Vitals reviewed.   Lab Results  Component Value Date   WBC 3.7 (L) 06/14/2016   HGB 12.2 06/14/2016   HCT 36.5 06/14/2016   PLT 221.0 06/14/2016   GLUCOSE 110 (H) 06/14/2016   CHOL 193 06/14/2016   TRIG 37.0 06/14/2016   HDL 63.20 06/14/2016   LDLCALC 122 (H) 06/14/2016   ALT 12 06/14/2016   AST 19 06/14/2016   NA 140 06/14/2016   K 3.9 06/14/2016   CL 104 06/14/2016   CREATININE 0.73 12/06/2016   BUN 12 12/06/2016   CO2 31 06/14/2016   TSH 0.73 09/10/2016   HGBA1C 5.1 06/14/2016    Assessment & Plan:   Problem List Items Addressed This Visit      Cardiovascular and Mediastinum   Hypertension    BP elevated today but BP's at home well controlled. Continue Coreg, amlodipine, HCTZ.        Other   Abdominal pain    New problem. Pain free currently.  No red flags.  Continue omeprazole. If persists, will recommend seeing GI again given history of H pylori.        Follow-up: 3-6 months  Thersa Salt DO [facility]  

## 2017-07-30 ENCOUNTER — Other Ambulatory Visit: Payer: Self-pay | Admitting: Family Medicine

## 2017-08-01 ENCOUNTER — Other Ambulatory Visit: Payer: Self-pay | Admitting: Family Medicine

## 2017-08-01 ENCOUNTER — Ambulatory Visit
Admission: RE | Admit: 2017-08-01 | Discharge: 2017-08-01 | Disposition: A | Discharge status: Home / Self Care | Payer: Managed Care, Other (non HMO) | Source: Ambulatory Visit | Attending: Family Medicine | Admitting: Family Medicine

## 2017-08-01 DIAGNOSIS — N6310 Unspecified lump in the right breast, unspecified quadrant: Secondary | ICD-10-CM

## 2017-08-01 DIAGNOSIS — R928 Other abnormal and inconclusive findings on diagnostic imaging of breast: Secondary | ICD-10-CM

## 2017-08-01 DIAGNOSIS — Z1231 Encounter for screening mammogram for malignant neoplasm of breast: Secondary | ICD-10-CM

## 2017-08-01 DIAGNOSIS — R921 Mammographic calcification found on diagnostic imaging of breast: Secondary | ICD-10-CM | POA: Insufficient documentation

## 2017-08-15 ENCOUNTER — Ambulatory Visit
Admission: RE | Admit: 2017-08-15 | Discharge: 2017-08-15 | Disposition: A | Discharge status: Home / Self Care | Payer: Managed Care, Other (non HMO) | Source: Ambulatory Visit | Attending: Family Medicine | Admitting: Family Medicine

## 2017-08-15 DIAGNOSIS — R921 Mammographic calcification found on diagnostic imaging of breast: Secondary | ICD-10-CM | POA: Insufficient documentation

## 2017-08-15 DIAGNOSIS — R928 Other abnormal and inconclusive findings on diagnostic imaging of breast: Secondary | ICD-10-CM

## 2017-08-15 DIAGNOSIS — N6021 Fibroadenosis of right breast: Secondary | ICD-10-CM | POA: Diagnosis not present

## 2017-08-15 HISTORY — PX: BREAST BIOPSY: SHX20

## 2017-08-16 LAB — SURGICAL PATHOLOGY

## 2017-10-17 ENCOUNTER — Encounter (INDEPENDENT_AMBULATORY_CARE_PROVIDER_SITE_OTHER): Payer: Self-pay

## 2017-10-17 ENCOUNTER — Encounter: Payer: Self-pay | Admitting: Physician Assistant

## 2017-10-17 ENCOUNTER — Ambulatory Visit (INDEPENDENT_AMBULATORY_CARE_PROVIDER_SITE_OTHER): Payer: Managed Care, Other (non HMO) | Admitting: Physician Assistant

## 2017-10-17 VITALS — BP 120/72 | HR 80 | Ht 62.5 in | Wt 188.2 lb

## 2017-10-17 DIAGNOSIS — K5900 Constipation, unspecified: Secondary | ICD-10-CM | POA: Diagnosis not present

## 2017-10-17 DIAGNOSIS — R12 Heartburn: Secondary | ICD-10-CM | POA: Diagnosis not present

## 2017-10-17 DIAGNOSIS — R1084 Generalized abdominal pain: Secondary | ICD-10-CM

## 2017-10-17 MED ORDER — RANITIDINE HCL 150 MG PO TABS: 150.0000 mg | ORAL_TABLET | ORAL | 2 refills | Status: DC | PRN

## 2017-10-17 NOTE — Patient Instructions (Signed)
We have given you a high fiber diet handout. Please strive to have 25-30 grams of fiber daily.   Stay on [facility] for 2 months.   Take Miralax daily as directed as needed for constipation.   We have sent the following medications to your pharmacy for you to pick up at your convenience: Zantac 150 mg as needed for reflux.

## 2017-10-17 NOTE — Progress Notes (Addendum)
Chief Complaint: Abdominal pain, constipation  HPI:  Gail Baker is a 61 year old female with a past medical history as listed below, who follows with Dr. Hilarie Fredrickson, and returns to clinic today for complaint of abdominal pain and constipation.    Please recall patient followed with Dr. Hilarie Fredrickson 02/13/17 after treatment with Pylera for H. pylori discovered on endoscopy 11/26/16. At that time, EGD revealed linear erosions in the gastric body which were biopsied and positive for H. pylori gastritis. Colonoscopy was also performed showing 5 polyps which are tubular adenomas. She had been treated with Pylera and twice a day PPI. She also had a CT scan due to some weight loss, which was unrevealing. At that visit patient did report doing fairly well until starting calcium and vitamin D and then starting with some of her abdominal discomfort and constipation again. At that time, it was recommended she have H. pylori stool testing to confirm H. pylori eradication, which was negative, and it was recommended she use Colace or MiraLAX for constipation. It was also suggested that she stop her calcium as this may be causing some of her symptoms.   Today, the patient presents to clinic and explains that she was feeling well until a few weeks ago when she started to become more constipated. She also developed some corresponding abdominal pain which seemed to radiate up her left side and across her upper abdomen. She has started [place] which was helping a little bit but continues to have smaller than normal bowel movements that do not feel complete. She does describe that starteing [facility] did alleviate her symptoms of excessive gas and "gurgling". Patient believes this is related to eating sugar, so she has stopped doing this. She would like something natural to help with her bowel movements. She does not want to become "dependent on something".   Patient also describes increasing heartburn with  occasional epigastric pain, this occurs at least a few times a week, she has been using Rolaids when necessary which "hasn't really helped".   Patient denies fever, chills, blood in her stool, melena, anorexia, nausea, vomiting or symptoms that awaken her at night.  Past Medical History:  Diagnosis Date  . Asthma   . H. pylori infection   . History of UTI   . Hyperlipidemia   . Hypertension   . Hyperthyroidism   . Tubular adenoma of colon     Past Surgical History:  Procedure Laterality Date  . BREAST BIOPSY Right 08/15/2017   path pending  . CESAREAN SECTION      Current Outpatient Prescriptions  Medication Sig Dispense Refill  . amLODipine (NORVASC) 10 MG tablet Take 1 tablet (10 mg total) by mouth daily. 90 tablet 3  . carvedilol (COREG) 12.5 MG tablet TAKE 1 TABLET (12.5 MG TOTAL) BY MOUTH 2 (TWO) TIMES DAILY WITH A MEAL. 180 tablet 3  . conjugated estrogens (PREMARIN) vaginal cream Place 1 Applicatorful vaginally daily. 42.5 g 12  . hydrochlorothiazide (HYDRODIURIL) 25 MG tablet TAKE 1 TABLET (25 MG TOTAL) BY MOUTH DAILY. 90 tablet 2  . traMADol (ULTRAM) 50 MG tablet Take 1 tablet (50 mg total) by mouth every 12 (twelve) hours as needed. 30 tablet 0   No current facility-administered medications for this visit.     Allergies as of 10/17/2017  . (No Known Allergies)    Family History  Problem Relation Age of Onset  . Hypertension Mother   . Hypertension Father   . Diabetes Father   .  Colon cancer Neg Hx   . Colon polyps Neg Hx   . Pancreatic cancer Neg Hx   . Rectal cancer Neg Hx   . Stomach cancer Neg Hx     Social History   Social History  . Marital status: Single    Spouse name: N/A  . Number of children: 1  . Years of education: N/A   Occupational History  . Not on file.   Social History Main Topics  . Smoking status: Never Smoker  . Smokeless tobacco: Never Used  . Alcohol use No  . Drug use: No  . Sexual activity: Not on file   Other Topics  Concern  . Not on file   Social History Narrative  . No narrative on file    Review of Systems:    Constitutional: No weight loss, fever or chills Cardiovascular: No chest pain Respiratory: No SOB  Gastrointestinal: See HPI and otherwise negative   Physical Exam:  Vital signs: BP 120/72   Pulse 80   Ht 5' 2.5" (1.588 m)   Wt 188 lb 3.2 oz (85.4 kg)   BMI 33.87 kg/m   Constitutional:   Pleasant female appears to be in NAD, Well developed, Well nourished, alert and cooperative Respiratory: Respirations even and unlabored. Lungs clear to auscultation bilaterally.   No wheezes, crackles, or rhonchi.  Cardiovascular: Normal S1, S2. No MRG. Regular rate and rhythm. No peripheral edema, cyanosis or pallor.  Gastrointestinal:  Soft, mild epigastric ttp, nontender. No rebound or guarding. Normal bowel sounds. No appreciable masses or hepatomegaly. Psychiatric:  Demonstrates good judgement and reason without abnormal affect or behaviors.  No recent labs or imaging.  Assessment: 1. Constipation: Over the past month, better with [facility], not completely resolved 2. Heartburn: 2 days a week, with some excess gas and bloating 3. Generalized abdominal pain: Patient describes relation to constipation and heartburn  Plan: 1. Would recommend the patient continue her [facility] for at least 2 months 2. Recommend patient increase fiber in her diet to at least 25-35 g per day with use of fiber supplement and/or through her diet. Did provide her with a high-fiber handout. 3. Recommend she increase her exercise to at least 30 minutes per day and water to 6-8 8 ounce glasses of water per day 4. Would recommend she use MiraLAX if she needs a laxative on top of measures above at least once daily, did discuss titration of this. 5. Prescribed Zantac 150 mg when necessary for breakthrough heartburn symptoms 6. Patient to follow in clinic as needed with Dr. Hilarie Fredrickson or myself for  continued symptoms.  Ellouise Newer, PA-C [facility] 10/17/2017, 10:54 AM  Cc: Gail Haven, MD   Addendum: Reviewed and agree with management. Pyrtle, Lajuan Lines, MD

## 2017-11-27 ENCOUNTER — Ambulatory Visit: Payer: Managed Care, Other (non HMO) | Admitting: Internal Medicine

## 2017-12-02 ENCOUNTER — Other Ambulatory Visit: Payer: Self-pay | Admitting: Family Medicine

## 2018-01-14 ENCOUNTER — Other Ambulatory Visit: Payer: Self-pay | Admitting: Physician Assistant

## 2018-01-23 ENCOUNTER — Other Ambulatory Visit: Payer: Self-pay

## 2018-01-23 ENCOUNTER — Ambulatory Visit: Payer: Managed Care, Other (non HMO) | Admitting: Family Medicine

## 2018-01-23 ENCOUNTER — Encounter: Payer: Self-pay | Admitting: Family Medicine

## 2018-01-23 VITALS — BP 112/76 | HR 94 | Temp 98.8°F | Wt 188.2 lb

## 2018-01-23 DIAGNOSIS — R1010 Upper abdominal pain, unspecified: Secondary | ICD-10-CM | POA: Diagnosis not present

## 2018-01-23 DIAGNOSIS — E669 Obesity, unspecified: Secondary | ICD-10-CM | POA: Diagnosis not present

## 2018-01-23 DIAGNOSIS — M545 Low back pain, unspecified: Secondary | ICD-10-CM

## 2018-01-23 DIAGNOSIS — G8929 Other chronic pain: Secondary | ICD-10-CM | POA: Insufficient documentation

## 2018-01-23 DIAGNOSIS — E785 Hyperlipidemia, unspecified: Secondary | ICD-10-CM

## 2018-01-23 DIAGNOSIS — I10 Essential (primary) hypertension: Secondary | ICD-10-CM

## 2018-01-23 DIAGNOSIS — Z6833 Body mass index (BMI) 33.0-33.9, adult: Secondary | ICD-10-CM | POA: Insufficient documentation

## 2018-01-23 MED ORDER — TRAMADOL HCL 50 MG PO TABS: 50.0000 mg | ORAL_TABLET | Freq: Two times a day (BID) | ORAL | 0 refills | Status: DC | PRN

## 2018-01-23 NOTE — Assessment & Plan Note (Signed)
She will return for fasting labs.

## 2018-01-23 NOTE — Assessment & Plan Note (Signed)
Well-controlled.  Continue current regimen.  She will return for fasting lab work.

## 2018-01-23 NOTE — Assessment & Plan Note (Signed)
Seems to be related to BMs and sugar intake. Reflux is well controlled with zantac. She will continue this. She will see GI as planned.

## 2018-01-23 NOTE — Assessment & Plan Note (Signed)
Well controlled with prn tramadol. Tramadol refilled. She will monitor. If worsens she will let us know. Given return precautions.

## 2018-01-23 NOTE — Progress Notes (Signed)
Tommi Rumps, MD Phone: [phone]  Gail Baker is a 62 y.o. female who presents today for follow-up.  Hypertension: 120s over 70s-80s.  Taking amlodipine, carvedilol, HCTZ.  No chest pain, shortness of breath, or edema.  GERD: Notes this has improved quite a bit with Zantac.  No burning or sour taste.  No blood in her stool.  She notes some rare epigastric discomfort if she takes in sugary food.  Feels like she has to have a bowel movement with this.  Typically resolves with a bowel movement.  She does have an appointment with GI.  She does have a history of H. pylori with her most recent test being negative.  Chronic low back pain: Occasionally bothers her while she is working.  She went and got shoe inserts to help.  She has noticed some benefit.  Occasionally radiates to her left buttocks.  No numbness, weakness, loss of bowel or bladder function, or saddle anesthesia.  She takes tramadol infrequently for this.  It does make her a little drowsy though she does not drive or operate any machinery or equipment while taking this.  Social History   Tobacco Use  Smoking Status Never Smoker  Smokeless Tobacco Never Used     ROS see history of present illness  Objective  Physical Exam Vitals:   01/23/18 1108  BP: 112/76  Pulse: 94  Temp: 98.8 F (37.1 C)  SpO2: 99%    BP Readings from Last 3 Encounters:  01/23/18 112/76  10/17/17 120/72  07/22/17 (!) 164/98   Wt Readings from Last 3 Encounters:  01/23/18 188 lb 3.2 oz (85.4 kg)  10/17/17 188 lb 3.2 oz (85.4 kg)  07/22/17 185 lb 4 oz (84 kg)    Physical Exam  Constitutional: No distress.  Cardiovascular: Normal rate, regular rhythm and normal heart sounds.  Pulmonary/Chest: Effort normal and breath sounds normal.  Abdominal: Soft. Bowel sounds are normal. She exhibits no distension. There is no tenderness.  Musculoskeletal: She exhibits no edema.  No midline spine tenderness, no midline spine step-off, no  muscular back tenderness  Neurological: She is alert. Gait normal.  Able to rise from a seated position on her own  Skin: Skin is warm and dry. She is not diaphoretic.     Assessment/Plan: Please see individual problem list.  Hypertension Well-controlled.  Continue current regimen.  She will return for fasting lab work.  Obesity (BMI 30.0-34.9) Check A1c.  Hyperlipidemia She will return for fasting labs.   Abdominal pain Seems to be related to BMs and sugar intake. Reflux is well controlled with zantac. She will continue this. She will see GI as planned.  Chronic low back pain Well controlled with prn tramadol. Tramadol refilled. She will monitor. If worsens she will let us know. Given return precautions.    Orders Placed This Encounter  Procedures  . Comp Met (CMET)    Standing Status:   Future    Standing Expiration Date:   01/23/2019  . Lipid panel    Standing Status:   Future    Standing Expiration Date:   01/23/2019  . Hemoglobin A1c    Standing Status:   Future    Standing Expiration Date:   01/23/2019    Meds ordered this encounter  Medications  . traMADol (ULTRAM) 50 MG tablet    Sig: Take 1 tablet (50 mg total) by mouth every 12 (twelve) hours as needed.    Dispense:  30 tablet    Refill:  0  Tommi Rumps, MD [place]

## 2018-01-23 NOTE — Patient Instructions (Signed)
Nice to see you. Please keep your appointment with GI. I have refilled your tramadol. Please make a lab appointment for sometime in the next 1-2 weeks for fasting lab work.

## 2018-01-23 NOTE — Assessment & Plan Note (Signed)
Check A1c. 

## 2018-02-03 ENCOUNTER — Other Ambulatory Visit (INDEPENDENT_AMBULATORY_CARE_PROVIDER_SITE_OTHER): Payer: Managed Care, Other (non HMO)

## 2018-02-03 DIAGNOSIS — I10 Essential (primary) hypertension: Secondary | ICD-10-CM | POA: Diagnosis not present

## 2018-02-03 DIAGNOSIS — E785 Hyperlipidemia, unspecified: Secondary | ICD-10-CM

## 2018-02-03 DIAGNOSIS — E669 Obesity, unspecified: Secondary | ICD-10-CM

## 2018-02-03 LAB — COMPREHENSIVE METABOLIC PANEL
ALT: 10 U/L (ref 0–35)
AST: 17 U/L (ref 0–37)
Albumin: 4 g/dL (ref 3.5–5.2)
Alkaline Phosphatase: 50 U/L (ref 39–117)
BILIRUBIN TOTAL: 0.7 mg/dL (ref 0.2–1.2)
BUN: 16 mg/dL (ref 6–23)
CALCIUM: 9.3 mg/dL (ref 8.4–10.5)
CHLORIDE: 105 meq/L (ref 96–112)
CO2: 28 meq/L (ref 19–32)
Creatinine, Ser: 0.76 mg/dL (ref 0.40–1.20)
GFR: 98.84 mL/min (ref >60.00)
Glucose, Bld: 87 mg/dL (ref 70–99)
Potassium: 3.9 mEq/L (ref 3.5–5.1)
Sodium: 140 mEq/L (ref 135–145)
Total Protein: 7.7 g/dL (ref 6.0–8.3)

## 2018-02-03 LAB — LIPID PANEL
CHOL/HDL RATIO: 2
Cholesterol: 157 mg/dL (ref 0–200)
HDL: 62.8 mg/dL (ref >39.00)
LDL CALC: 87 mg/dL (ref 0–99)
NonHDL: 93.71
TRIGLYCERIDES: 35 mg/dL (ref 0.0–149.0)
VLDL: 7 mg/dL (ref 0.0–40.0)

## 2018-02-03 LAB — HEMOGLOBIN A1C: Hgb A1c MFr Bld: 5.2 % (ref 4.6–6.5)

## 2018-02-04 ENCOUNTER — Other Ambulatory Visit: Payer: Managed Care, Other (non HMO)

## 2018-02-09 ENCOUNTER — Other Ambulatory Visit: Payer: Self-pay | Admitting: Family Medicine

## 2018-02-09 DIAGNOSIS — I700 Atherosclerosis of aorta: Secondary | ICD-10-CM

## 2018-02-09 MED ORDER — ROSUVASTATIN CALCIUM 10 MG PO TABS: 10.0000 mg | ORAL_TABLET | Freq: Every day | ORAL | 3 refills | Status: DC

## 2018-02-09 NOTE — Progress Notes (Signed)
c 

## 2018-02-12 ENCOUNTER — Other Ambulatory Visit: Payer: Self-pay | Admitting: Family Medicine

## 2018-02-12 NOTE — Telephone Encounter (Signed)
Last OV 01/23/18 last filled by Dr.Cook  03/15/17 180 3rf

## 2018-02-17 ENCOUNTER — Encounter: Payer: Self-pay | Admitting: Internal Medicine

## 2018-02-17 ENCOUNTER — Ambulatory Visit: Payer: Managed Care, Other (non HMO) | Admitting: Internal Medicine

## 2018-02-17 VITALS — BP 122/74 | HR 70 | Ht 62.0 in | Wt 188.1 lb

## 2018-02-17 DIAGNOSIS — M519 Unspecified thoracic, thoracolumbar and lumbosacral intervertebral disc disorder: Secondary | ICD-10-CM

## 2018-02-17 DIAGNOSIS — R1030 Lower abdominal pain, unspecified: Secondary | ICD-10-CM

## 2018-02-17 DIAGNOSIS — Z86010 Personal history of colonic polyps: Secondary | ICD-10-CM

## 2018-02-17 DIAGNOSIS — M545 Low back pain: Secondary | ICD-10-CM

## 2018-02-17 DIAGNOSIS — K5900 Constipation, unspecified: Secondary | ICD-10-CM

## 2018-02-17 DIAGNOSIS — R109 Unspecified abdominal pain: Secondary | ICD-10-CM

## 2018-02-17 DIAGNOSIS — K219 Gastro-esophageal reflux disease without esophagitis: Secondary | ICD-10-CM

## 2018-02-17 MED ORDER — DICYCLOMINE HCL 10 MG PO CAPS: 10.0000 mg | ORAL_CAPSULE | Freq: Three times a day (TID) | ORAL | 1 refills | Status: DC | PRN

## 2018-02-17 NOTE — Patient Instructions (Signed)
Continue ranitidine.  Continue your probiotic.  We have sent the following medications to your pharmacy for you to pick up at your convenience: Bentyl 10 mg three times daily as needed for abdominal cramping  Please follow up with Dr Hilarie Fredrickson in 6 months.  You have been scheduled to see Dr Lois Huxley on 03/04/18 at 1:00 pm, [facility] 1st floor. Please arrive at 12:45 pm for registration.  If you are age 63 or older, your body mass index should be between 23-30. Your Body mass index is 34.41 kg/m. If this is out of the aforementioned range listed, please consider follow up with your Primary Care Provider.  If you are age 91 or younger, your body mass index should be between 19-25. Your Body mass index is 34.41 kg/m. If this is out of the aformentioned range listed, please consider follow up with your Primary Care Provider.

## 2018-02-17 NOTE — Progress Notes (Signed)
Subjective:    Patient ID: Gail Baker, female    DOB: August 08, 1956, 62 y.o.   MRN: [id number]  HPI Gail Baker is a 62 year old female with history of H. pylori status post treatment, history of adenomatous colon polyps, hypertension and hypothyroidism who is here for follow-up.  She was last seen on 10/17/2017 by Ellouise Newer, PA-C.  At that time she was having some issues with constipation and left-sided abdominal pain.  She was also having heartburn intermittently.  At that time it was recommended that she continue probiotic, increase the fiber in her diet, increase her water intake and use MiraLAX if needed.  She was prescribed Zantac which she has been using 150 mg in the evening on a daily basis.  Today she reports that her heartburn has completely resolved.  She is only using the ranitidine in the evening.  No dysphagia or odynophagia.  She is occasionally having some left-sided abdominal discomfort which can radiate both at times into her left leg and occasionally across her lower abdomen.  This can be crampy in nature.  She reports the constipation has improved and she is having 1-2 soft but formed stools daily.  Rarely her stools firm.  She has no blood in her stool or melena.  No nausea or she is using a probiotic called [employer]age.  She feels that this works better than the [employer].  Review of Systems As per HPI, otherwise negative  Current Medications, Allergies, Past Medical History, Past Surgical History, Family History and Social History were reviewed in [employer] record.     Objective:   Physical Exam BP 122/74   Pulse 70   Ht 5\' 2"  (1.575 m)   Wt 188 lb 2 oz (85.3 kg)   BMI 34.41 kg/m  Constitutional: Well-developed and well-nourished. No distress. HEENT: Normocephalic and atraumatic. Conjunctivae are normal.  No scleral icterus. Neck: Neck supple. Trachea midline. Cardiovascular: Normal rate, regular rhythm and  intact distal pulses. No M/R/G Pulmonary/chest: Effort normal and breath sounds normal. No wheezing, rales or rhonchi. Abdominal: Soft, nontender, nondistended. Bowel sounds active throughout.  Extremities: no clubbing, cyanosis, or edema Neurological: Alert and oriented to person place and time. Skin: Skin is warm and dry. Psychiatric: Normal mood and affect. Behavior is normal.      Assessment & Plan:  62 year old female with history of H. pylori status post treatment, history of adenomatous colon polyps, hypertension and hypothyroidism who is here for follow-up.   1.  Mild constipation with occasional lower abdominal pain --I recommended that she continue with liberal water intake, high fiber diet and daily probiotic.  I will prescribe Bentyl to be used 10 mg 3 times daily as needed when she develops crampy discomfort.  Some of her left-sided abdominal pain radiating to her left leg may be due to her significant lumbar disc disease, see #4.  2.  History of colon polyps --surveillance colonoscopy recommended November 2020  3.  GERD --mild and completely relieved with ranitidine once daily.  I reminded her this could be used twice daily on an as-needed basis for heartburn.  No alarm symptoms.  History of successfully treated H. Pylori  4.  Lumbar disc disease --significant degenerative disc disease with some impingement noted on a CT scan that I performed over a year ago.  She does report significant intermittent lower back pain occasion with some radicular symptoms as described above.  She would like to avoid surgery if at all possible.  I am referring her to see Dr. Charlann Boxer for further evaluation and management of this issue.  28-month follow-up with me, sooner if needed.  If she is doing well follow-up can be as long as 12 months away  25 minutes spent with the patient today. Greater than 50% was spent in counseling and coordination of care with the patient

## 2018-03-03 NOTE — Progress Notes (Signed)
[name] [address] [place], [place] [address] Phone: [phone] Subjective:    I'm seeing this patient by the request  of:  Leone Haven, MD  Dr. Hilarie Fredrickson MD  [id number]  Gail Baker is a 62 y.o. female coming in with complaint of back pain. Chronic. Right side. Says her pain moves to the left side. Has been taking pain relievers.     Patient did have a CT abdomen pelvis done in 2017 that was independently visualized by me.  Patient was found to have impingement at L3 through L5.  Patient states still having pain overall.  Some radiation of the pain.  Sometimes feels like the legs are weak.  Patient does seem to have to support her self when she goes to the store.  Patient states that there is weakness.  Rates the severity of pain is 7 out of 10.  Past Medical History:  Diagnosis Date  . Asthma   . H. pylori infection   . History of UTI   . Hyperlipidemia   . Hypertension   . Hyperthyroidism   . Tubular adenoma of colon    Past Surgical History:  Procedure Laterality Date  . BREAST BIOPSY Right 08/15/2017   path pending  . CESAREAN SECTION     Social History   Socioeconomic History  . Marital status: Single    Spouse name: None  . Number of children: 1  . Years of education: None  . Highest education level: None  Social Needs  . Financial resource strain: None  . Food insecurity - worry: None  . Food insecurity - inability: None  . Transportation needs - medical: None  . Transportation needs - non-medical: None  Occupational History  . None  Tobacco Use  . Smoking status: Never Smoker  . Smokeless tobacco: Never Used  Substance and Sexual Activity  . Alcohol use: No    Alcohol/week: 0.0 oz  . Drug use: No  . Sexual activity: None  Other Topics Concern  . None  Social History Narrative  . None   No Known Allergies Family History  Problem Relation Age of Onset  . Hypertension Mother   . Hypertension Father    . Diabetes Father   . Colon cancer Neg Hx   . Colon polyps Neg Hx   . Pancreatic cancer Neg Hx   . Rectal cancer Neg Hx   . Stomach cancer Neg Hx      Past medical history, social, surgical and family history all reviewed in electronic medical record.  No pertanent information unless stated regarding to the chief complaint.   Review of Systems:Review of systems updated and as accurate as of 03/04/18  No headache, visual changes, nausea, vomiting, diarrhea, constipation, dizziness, abdominal pain, skin rash, fevers, chills, night sweats, weight loss, swollen lymph nodes, body aches, joint swelling,  chest pain, shortness of breath, mood changes.  Positive muscle aches   Objective  Blood pressure 130/90, pulse 63, height 5\' 2"  (1.575 m), weight 190 lb (86.2 kg), SpO2 98 %. Systems examined below as of 03/04/18   General: No apparent distress alert and oriented x3 mood and affect normal, dressed appropriately.  HEENT: Pupils equal, extraocular movements intact  Respiratory: Patient's speak in full sentences and does not appear short of breath  Cardiovascular: No lower extremity edema, non tender, no erythema  Skin: Warm dry intact with no signs of infection or rash on extremities or on axial skeleton.  Abdomen: Soft nontender  Neuro: Cranial nerves II through XII are intact, neurovascularly intact in all extremities with 2+ DTRs and 2+ pulses.  Lymph: No lymphadenopathy of posterior or anterior cervical chain or axillae bilaterally.  Gait normal with good balance and coordination.  MSK:  Non tender with full range of motion and good stability and symmetric strength and tone of shoulders, elbows, wrist, hip, knee and ankles bilaterally.  Back Exam:  Inspection: Loss of lordosis with degenerative scoliosis Motion: Flexion 35 deg, Extension 15 deg, Side Bending to 35 deg bilaterally,  Rotation to 35 deg bilaterally  SLR laying: Negative  XSLR laying: Negative  Palpable tenderness:  Tender to palpation diffusely. FABER: Severely positive bilaterally. Sensory change: Gross sensation intact to all lumbar and sacral dermatomes.  Reflexes: 2+ at both patellar tendons, 2+ at achilles tendons, Babinski's downgoing.  Leg strength  4 out of 5 and symmetric  [id number]; 15 additional minutes spent for Therapeutic exercises as stated in above notes.  This included exercises focusing on stretching, strengthening, with significant focus on eccentric aspects.   Long term goals include an improvement in range of motion, strength, endurance as well as avoiding reinjury. Patient's frequency would include in 1-2 times a day, 3-5 times a week for a duration of 6-12 weeks. Low back exercises that included:  Pelvic tilt/bracing instruction to focus on control of the pelvic girdle and lower abdominal muscles  Glute strengthening exercises, focusing on proper firing of the glutes without engaging the low back muscles Proper stretching techniques for maximum relief for the hamstrings, hip flexors, low back and some rotation where tolerated  Proper technique shown and discussed handout in great detail with ATC.  All questions were discussed and answered.      Impression and Recommendations:     This case required medical decision making of moderate complexity.      Note: This dictation was prepared with Dragon dictation along with smaller phrase technology. Any transcriptional errors that result from this process are unintentional.

## 2018-03-04 ENCOUNTER — Encounter: Payer: Self-pay | Admitting: Family Medicine

## 2018-03-04 ENCOUNTER — Ambulatory Visit: Payer: Managed Care, Other (non HMO) | Admitting: Family Medicine

## 2018-03-04 DIAGNOSIS — M48062 Spinal stenosis, lumbar region with neurogenic claudication: Secondary | ICD-10-CM

## 2018-03-04 DIAGNOSIS — M48061 Spinal stenosis, lumbar region without neurogenic claudication: Secondary | ICD-10-CM | POA: Insufficient documentation

## 2018-03-04 MED ORDER — GABAPENTIN 100 MG PO CAPS: 200.0000 mg | ORAL_CAPSULE | Freq: Every day | ORAL | 3 refills | Status: DC

## 2018-03-04 NOTE — Assessment & Plan Note (Signed)
Lumbar spinal stenosis. Discussed gabapentin  Discussed the possibility of epidural injections.  Patient wants to hold on that at this moment. Discussed home exercise, over-the-counter medications, patient will follow-up in 3-4 weeks

## 2018-03-04 NOTE — Patient Instructions (Addendum)
Good to see you  I think you have spinal stenosis.  Ice is your friend. Ice 20 minutes 2 times daily. Usually after activity and before bed. Gabapentin 200mg  nightly  Over the counter get  Vitamin D 2000 IU daily  Turmeric 500mg  daily  Tart cherry extract any dose at night  Exercises 3 times a week.  Continue the orthotics as well  Consider a new mattress  See me again in 4 weeks

## 2018-03-10 ENCOUNTER — Other Ambulatory Visit: Payer: Managed Care, Other (non HMO)

## 2018-03-12 ENCOUNTER — Other Ambulatory Visit (INDEPENDENT_AMBULATORY_CARE_PROVIDER_SITE_OTHER): Payer: Managed Care, Other (non HMO)

## 2018-03-12 DIAGNOSIS — I700 Atherosclerosis of aorta: Secondary | ICD-10-CM

## 2018-03-12 LAB — HEPATIC FUNCTION PANEL
ALK PHOS: 48 U/L (ref 39–117)
ALT: 11 U/L (ref 0–35)
AST: 16 U/L (ref 0–37)
Albumin: 3.9 g/dL (ref 3.5–5.2)
BILIRUBIN DIRECT: 0.1 mg/dL (ref 0.0–0.3)
TOTAL PROTEIN: 7.5 g/dL (ref 6.0–8.3)
Total Bilirubin: 0.4 mg/dL (ref 0.2–1.2)

## 2018-03-12 LAB — LDL CHOLESTEROL, DIRECT: Direct LDL: 61 mg/dL

## 2018-03-17 ENCOUNTER — Telehealth: Payer: Self-pay | Admitting: Family Medicine

## 2018-03-17 NOTE — Telephone Encounter (Signed)
Copied from [place]. Topic: Quick Communication - Rx Refill/Question >> Mar 17, 2018 12:51 PM Carolyn Stare wrote: Medication    hydrochlorothiazide (HYDRODIURIL) 25 MG tablet  Has the patient contacted their pharmacy yes       Preferred [facility]: Please be advised that RX refills may take up to 3 business days. We ask that you follow-up with your pharmacy.

## 2018-03-18 ENCOUNTER — Other Ambulatory Visit: Payer: Self-pay

## 2018-03-18 MED ORDER — HYDROCHLOROTHIAZIDE 25 MG PO TABS: 25.0000 mg | ORAL_TABLET | Freq: Every day | ORAL | 1 refills | Status: DC

## 2018-03-19 ENCOUNTER — Other Ambulatory Visit: Payer: Self-pay | Admitting: Physician Assistant

## 2018-04-01 NOTE — Progress Notes (Signed)
[name] [place], [place] [address] Phone: [phone] Subjective:     CC: Back pain follow-up  [id number]  Gail Baker is a 62 y.o. female coming in with complaint of back pain. Her pain has improved. She has pain still in the morning when she wakes up but her pain diminishes throughout the day.  Patient states if anything she has noticed that the severity and frequency of the pain is improving over the course of time.  Happy with these results.  No side effects to the medications.  Would state that she is 60-65% better      Past Medical History:  Diagnosis Date  . Asthma   . H. pylori infection   . History of UTI   . Hyperlipidemia   . Hypertension   . Hyperthyroidism   . Tubular adenoma of colon    Past Surgical History:  Procedure Laterality Date  . BREAST BIOPSY Right 08/15/2017   path pending  . CESAREAN SECTION     Social History   Socioeconomic History  . Marital status: Single    Spouse name: Not on file  . Number of children: 1  . Years of education: Not on file  . Highest education level: Not on file  Occupational History  . Not on file  Social Needs  . Financial resource strain: Not on file  . Food insecurity:    Worry: Not on file    Inability: Not on file  . Transportation needs:    Medical: Not on file    Non-medical: Not on file  Tobacco Use  . Smoking status: Never Smoker  . Smokeless tobacco: Never Used  Substance and Sexual Activity  . Alcohol use: No    Alcohol/week: 0.0 oz  . Drug use: No  . Sexual activity: Not on file  Lifestyle  . Physical activity:    Days per week: Not on file    Minutes per session: Not on file  . Stress: Not on file  Relationships  . Social connections:    Talks on phone: Not on file    Gets together: Not on file    Attends religious service: Not on file    Active member of club or organization: Not on file    Attends meetings of clubs or  organizations: Not on file    Relationship status: Not on file  Other Topics Concern  . Not on file  Social History Narrative  . Not on file   No Known Allergies Family History  Problem Relation Age of Onset  . Hypertension Mother   . Hypertension Father   . Diabetes Father   . Colon cancer Neg Hx   . Colon polyps Neg Hx   . Pancreatic cancer Neg Hx   . Rectal cancer Neg Hx   . Stomach cancer Neg Hx      Past medical history, social, surgical and family history all reviewed in electronic medical record.  No pertanent information unless stated regarding to the chief complaint.   Review of Systems:Review of systems updated and as accurate as of 04/02/18  No headache, visual changes, nausea, vomiting, diarrhea, constipation, dizziness, abdominal pain, skin rash, fevers, chills, night sweats, weight loss, swollen lymph nodes, body aches, joint swelling,, chest pain, shortness of breath, mood changes.  Positive muscle aches  Objective  Blood pressure (!) 160/88, height 5\' 2"  (1.575 m), weight 190 lb (86.2 kg). Systems examined below as of 04/02/18  General: No apparent distress alert and oriented x3 mood and affect normal, dressed appropriately.  HEENT: Pupils equal, extraocular movements intact  Respiratory: Patient's speak in full sentences and does not appear short of breath  Cardiovascular: No lower extremity edema, non tender, no erythema  Skin: Warm dry intact with no signs of infection or rash on extremities or on axial skeleton.  Abdomen: Soft nontender  Neuro: Cranial nerves II through XII are intact, neurovascularly intact in all extremities with 2+ DTRs and 2+ pulses.  Lymph: No lymphadenopathy of posterior or anterior cervical chain or axillae bilaterally.  Gait normal with good balance and coordination.  MSK:  Non tender with full range of motion and good stability and symmetric strength and tone of shoulders, elbows, wrist, hip, knee and ankles bilaterally.   Degenerative arthritis of multiple joints.  Back Exam:  Inspection: Degenerative scoliosis noted with loss of lordosis Motion: Flexion 40 deg, Extension 25 deg, Side Bending to 35 deg bilaterally,  Rotation to 30 deg bilaterally  SLR laying: Negative no radicular symptoms XSLR laying: Negative  Palpable tenderness: Tender to palpation the paraspinal musculature.  Degenerative disc scoliosis. FABER: Significant tightness. Sensory change: Gross sensation intact to all lumbar and sacral dermatomes.  Reflexes: 2+ at both patellar tendons, 2+ at achilles tendons, Babinski's downgoing.  Strength at foot  4+ out of 5 and symmetric     Impression and Recommendations:     This case required medical decision making of moderate complexity.      Note: This dictation was prepared with Dragon dictation along with smaller phrase technology. Any transcriptional errors that result from this process are unintentional.

## 2018-04-02 ENCOUNTER — Encounter: Payer: Self-pay | Admitting: Family Medicine

## 2018-04-02 ENCOUNTER — Ambulatory Visit: Payer: Managed Care, Other (non HMO) | Admitting: Family Medicine

## 2018-04-02 DIAGNOSIS — M48062 Spinal stenosis, lumbar region with neurogenic claudication: Secondary | ICD-10-CM

## 2018-04-02 MED ORDER — GABAPENTIN 300 MG PO CAPS
ORAL_CAPSULE | ORAL | 3 refills | Status: DC
Start: 2018-04-02 — End: 2018-09-13

## 2018-04-02 NOTE — Patient Instructions (Signed)
Great to see you  You are doing great  Lets make one change and increase the gabapentin to 300mg  at night Consider the mattress again.  Exercises 2-3 times a week  I love the vitamins See me again in 2 months

## 2018-04-02 NOTE — Assessment & Plan Note (Signed)
Discussed with patient in great length.  Making progress.  We discussed increasing gabapentin to 300 mg.  Continue home exercises at the same treatment.  Worsening symptoms to call us sooner.  Follow-up again in 2 months

## 2018-04-15 ENCOUNTER — Other Ambulatory Visit: Payer: Self-pay | Admitting: Internal Medicine

## 2018-04-19 ENCOUNTER — Other Ambulatory Visit: Payer: Self-pay | Admitting: Internal Medicine

## 2018-06-04 ENCOUNTER — Ambulatory Visit: Payer: Managed Care, Other (non HMO) | Admitting: Family Medicine

## 2018-06-11 ENCOUNTER — Ambulatory Visit: Payer: Managed Care, Other (non HMO) | Admitting: Family Medicine

## 2018-06-11 ENCOUNTER — Encounter: Payer: Self-pay | Admitting: Family Medicine

## 2018-06-11 DIAGNOSIS — M48062 Spinal stenosis, lumbar region with neurogenic claudication: Secondary | ICD-10-CM

## 2018-06-11 NOTE — Progress Notes (Signed)
[name] [address] [place], [place] [address] Phone: [phone] Subjective:     CC: Back pain follow-up  [id number]  Gail Baker is a 62 y.o. female coming in with complaint of back pain. She is still having pain but only in the mornings. No change since last visit.  Concern for more of a spinal stenosis.  Patient states that the pain in the morning still is there but seems to take a lot less time to loosen up.  Patient states throughout the day does okay on the standing for a long amount of time and then starts having the radiation down the legs.  Gabapentin seems to be helpful at the higher dose of 300 mg.  Denies any side effects.  Denies that the back pain is stopping her from any activities at this time.       Past Medical History:  Diagnosis Date  . Asthma   . H. pylori infection   . History of UTI   . Hyperlipidemia   . Hypertension   . Hyperthyroidism   . Tubular adenoma of colon    Past Surgical History:  Procedure Laterality Date  . BREAST BIOPSY Right 08/15/2017   path pending  . CESAREAN SECTION     Social History   Socioeconomic History  . Marital status: Single    Spouse name: Not on file  . Number of children: 1  . Years of education: Not on file  . Highest education level: Not on file  Occupational History  . Not on file  Social Needs  . Financial resource strain: Not on file  . Food insecurity:    Worry: Not on file    Inability: Not on file  . Transportation needs:    Medical: Not on file    Non-medical: Not on file  Tobacco Use  . Smoking status: Never Smoker  . Smokeless tobacco: Never Used  Substance and Sexual Activity  . Alcohol use: No    Alcohol/week: 0.0 oz  . Drug use: No  . Sexual activity: Not on file  Lifestyle  . Physical activity:    Days per week: Not on file    Minutes per session: Not on file  . Stress: Not on file  Relationships  . Social connections:    Talks on phone:  Not on file    Gets together: Not on file    Attends religious service: Not on file    Active member of club or organization: Not on file    Attends meetings of clubs or organizations: Not on file    Relationship status: Not on file  Other Topics Concern  . Not on file  Social History Narrative  . Not on file   No Known Allergies Family History  Problem Relation Age of Onset  . Hypertension Mother   . Hypertension Father   . Diabetes Father   . Colon cancer Neg Hx   . Colon polyps Neg Hx   . Pancreatic cancer Neg Hx   . Rectal cancer Neg Hx   . Stomach cancer Neg Hx      Past medical history, social, surgical and family history all reviewed in electronic medical record.  No pertanent information unless stated regarding to the chief complaint.   Review of Systems:Review of systems updated and as accurate as of 06/11/18  No headache, visual changes, nausea, vomiting, diarrhea, constipation, dizziness, abdominal pain, skin rash, fevers, chills, night sweats, weight loss,  swollen lymph nodes, body aches, joint swelling, muscle aches, chest pain, shortness of breath, mood changes.   Objective  There were no vitals taken for this visit. Systems examined below as of 06/11/18   General: No apparent distress alert and oriented x3 mood and affect normal, dressed appropriately.  HEENT: Pupils equal, extraocular movements intact  Respiratory: Patient's speak in full sentences and does not appear short of breath  Cardiovascular: No lower extremity edema, non tender, no erythema  Skin: Warm dry intact with no signs of infection or rash on extremities or on axial skeleton.  Abdomen: Soft nontender  Neuro: Cranial nerves II through XII are intact, neurovascularly intact in all extremities with 2+ DTRs and 2+ pulses.  Lymph: No lymphadenopathy of posterior or anterior cervical chain or axillae bilaterally.  Gait normal with good balance and coordination.  MSK:  Non tender with full range of  motion and good stability and symmetric strength and tone of shoulders, elbows, wrist, hip, knee and ankles bilaterally.  Back exam does show loss of lordosis.  Patient does have tightness with the hamstrings bilaterally.  Patient does have some limited range of motion especially with extension with only 15 degrees.  Positive tightness with Corky Sox test.  Neurovascularly intact distally.    Impression and Recommendations:     This case required medical decision making of moderate complexity.      Note: This dictation was prepared with Dragon dictation along with smaller phrase technology. Any transcriptional errors that result from this process are unintentional.

## 2018-06-11 NOTE — Assessment & Plan Note (Signed)
Doing relatively well at this time.  Do not feel that advanced imaging is warranted as long as patient continues to respond well to the home exercises of the gabapentin.  Patient any worsening symptoms MRI and epidurals could be a possibility.  Patient will follow-up with me again in 3 months

## 2018-06-11 NOTE — Patient Instructions (Signed)
Good to see you  You are doing great  Continue the gabapentin 300mg  at night Continue the vitamins  Do my exercises 2 times a week  See me again in 3 months if not doing much better otherwise see me when you need me

## 2018-07-24 ENCOUNTER — Ambulatory Visit: Payer: Managed Care, Other (non HMO) | Admitting: Family Medicine

## 2018-07-29 ENCOUNTER — Ambulatory Visit: Payer: Managed Care, Other (non HMO) | Admitting: Family Medicine

## 2018-07-29 ENCOUNTER — Encounter: Payer: Self-pay | Admitting: Family Medicine

## 2018-07-29 ENCOUNTER — Ambulatory Visit (INDEPENDENT_AMBULATORY_CARE_PROVIDER_SITE_OTHER): Payer: Managed Care, Other (non HMO) | Admitting: Family Medicine

## 2018-07-29 DIAGNOSIS — M7051 Other bursitis of knee, right knee: Secondary | ICD-10-CM | POA: Diagnosis not present

## 2018-07-29 DIAGNOSIS — E669 Obesity, unspecified: Secondary | ICD-10-CM

## 2018-07-29 DIAGNOSIS — E785 Hyperlipidemia, unspecified: Secondary | ICD-10-CM | POA: Diagnosis not present

## 2018-07-29 DIAGNOSIS — I10 Essential (primary) hypertension: Secondary | ICD-10-CM | POA: Diagnosis not present

## 2018-07-29 DIAGNOSIS — M7052 Other bursitis of knee, left knee: Secondary | ICD-10-CM

## 2018-07-29 HISTORY — DX: Other bursitis of knee, right knee: M70.51

## 2018-07-29 HISTORY — DX: Other bursitis of knee, left knee: M70.52

## 2018-07-29 NOTE — Assessment & Plan Note (Signed)
Question whether or not she has peds anserine bursitis or tendinitis.  Discussed stretches and exercises.  She can ice the area.  If not improving she can see sports medicine.

## 2018-07-29 NOTE — Assessment & Plan Note (Signed)
Well controlled. Continue current medication.  

## 2018-07-29 NOTE — Progress Notes (Signed)
  Tommi Rumps, MD Phone: [phone]  Gail Baker is a 62 y.o. female who presents today for f/u.  CC: htn, hld, obesity, bilateral knee pain  HYPERTENSION  Disease Monitoring  Home BP Monitoring 208 systolically Chest pain- no    Dyspnea- no Medications  Compliance-  Taking amlodipine, coreg, HCTZ.  Edema- no  HYPERLIPIDEMIA Symptoms Chest pain on exertion:  no   Medications: Compliance- taking crestor Right upper quadrant pain- no  Muscle aches- no  Obesity: She is not exercising.  She does eat a fair number of carbs.  She does not eat very many fried foods or junk foods.  Bilateral knee pain: Patient notes discomfort in the area of her pes anserine bursa.  This has been going on for a while.  Bothers her more at the end of the day.  Is an aching discomfort.  No joint pain in her knees.  No locking.  No medications.    Social History   Tobacco Use  Smoking Status Never Smoker  Smokeless Tobacco Never Used     ROS see history of present illness  Objective  Physical Exam Vitals:   07/29/18 1438  BP: 130/80  Pulse: 73  Temp: 98.4 F (36.9 C)  SpO2: 98%    BP Readings from Last 3 Encounters:  07/29/18 130/80  06/11/18 128/86  04/02/18 (!) 160/88   Wt Readings from Last 3 Encounters:  07/29/18 193 lb 12.8 oz (87.9 kg)  06/11/18 192 lb (87.1 kg)  04/02/18 190 lb (86.2 kg)    Physical Exam  Constitutional: No distress.  Cardiovascular: Normal rate, regular rhythm and normal heart sounds.  Pulmonary/Chest: Effort normal and breath sounds normal.  Musculoskeletal: She exhibits no edema.  Knees with slight tenderness in the area of her peds anserine bursa, there is no swelling, warmth, or erythema in this area, no knee joint tenderness bilaterally  Neurological: She is alert.  Skin: Skin is warm and dry. She is not diaphoretic.     Assessment/Plan: Please see individual problem list.  Pes anserinus bursitis of both knees Question whether or  not she has peds anserine bursitis or tendinitis.  Discussed stretches and exercises.  She can ice the area.  If not improving she can see sports medicine.  Hypertension Well-controlled.  Continue current medication.  Hyperlipidemia Continue Crestor.  Obesity (BMI 30.0-34.9) Encouraged exercise.  She will monitor her diet.   No orders of the defined types were placed in this encounter.   No orders of the defined types were placed in this encounter.    Tommi Rumps, MD [place]

## 2018-07-29 NOTE — Patient Instructions (Addendum)
Pes Anserine Bursitis Rehab Ask your health care provider which exercises are safe for you. Do exercises exactly as told by your health care provider and adjust them as directed. It is normal to feel mild stretching, pulling, tightness, or discomfort as you do these exercises, but you should stop right away if you feel sudden pain or your pain gets worse.Do not begin these exercises until told by your health care provider. Stretching and range of motion exercises These exercises warm up your muscles and joints and improve the movement and flexibility of your knee. These exercises also help to relieve pain and stiffness. Exercise A: Hamstring, doorway  1. Lie on your back in front of a doorway with your left / right leg resting against the wall and your other leg flat on the floor in the doorway. There should be a slight bend in your left / right knee. 2. Straighten your left / right knee. You should feel a stretch behind your knee or thigh. If you do not, scoot your buttocks closer to the door. 3. Hold this position for __________ seconds. Repeat __________ times. Complete this stretch __________ times a day. Exercise B: V-sit ( hamstrings and adductors) 1. Sit on the floor with your legs extended in a large "V" shape. Keep your knees straight during this exercise. 2. Keeping your head and back in a straight line, bend at your waist to reach for your left foot (position A). You should feel a stretch in your right inner thigh. 3. Hold for __________ seconds. Then slowly return to the upright position. 4. Keeping your head and back in a straight line, bend at your waist to reach forward (position B). You should feel a stretch behind both of your thighs or knees. 5. Hold for __________ seconds. Then slowly return to the upright position. 6. Keeping your head and back in a straight line, bend at your waist to reach for your right foot (position C). You should feel a stretch in your left inner  thigh. 7. Hold for __________ seconds. Then slowly return to the upright position. Repeat __________ times. Complete this exercise __________ times a day. Exercise C: Quadriceps, prone  1. Lie on your abdomen on a firm surface, such as a bed or padded floor. 2. Bend your left / right knee and hold your ankle. If you cannot reach your ankle or pant leg, loop a belt around your foot and grab the belt instead. 3. Gently pull your heel toward your buttocks. Your knee should not slide out to the side. You should feel a stretch in the front of your thigh and knee. 4. Hold this position for __________ seconds. Repeat __________ times. Complete this stretch __________ times a day. Strengthening exercises These exercises build strength and endurance in your knee and hip. Endurance is the ability to use your muscles for a long time, even after they get tired. Exercise D: Terminal knee extension, quadriceps 1. Secure a long loop of rubber exercise band around a sturdy object like a table leg. 2. Put the band behind your left / right knee. Step back from where the band is secured to put tension on the band. 3. Slowly bend your left / right knee. Keep your left / right foot flat on the floor. 4. Tighten the muscle in the front of your thigh and push back against the band to straighten your knee. 5. Hold this position for __________ seconds. 6. Return to having your left / right knee bent. Repeat   __________ times. Complete this stretch __________ times a day. Exercise E: Straight leg raises ( hip abductors) 1. Lie on your side with your left / right leg in the top position. Your head, shoulder, knee, and hip should line up. You may bend your bottom knee to help you balance. 2. Lift your top leg 4-6 inches (10-15 cm) while keeping your toes pointed straight ahead. 3. Hold this position for __________ seconds. 4. Slowly lower your leg to the starting position. 5. Allow your muscles to relax completely  after each repetition. Repeat __________ times. Complete this exercise __________ times a day. This information is not intended to replace advice given to you by your health care provider. Make sure you discuss any questions you have with your health care provider. Document Released: 12/17/2005 Document Revised: 08/21/2016 Document Reviewed: 12/02/2015 Elsevier Interactive Patient Education  [employer].

## 2018-07-29 NOTE — Assessment & Plan Note (Signed)
Continue Crestor 

## 2018-07-29 NOTE — Assessment & Plan Note (Signed)
Encouraged exercise.  She will monitor her diet.

## 2018-08-12 ENCOUNTER — Other Ambulatory Visit: Payer: Self-pay | Admitting: Internal Medicine

## 2018-08-27 ENCOUNTER — Ambulatory Visit (INDEPENDENT_AMBULATORY_CARE_PROVIDER_SITE_OTHER): Payer: Managed Care, Other (non HMO) | Admitting: Internal Medicine

## 2018-08-27 ENCOUNTER — Encounter: Payer: Self-pay | Admitting: Internal Medicine

## 2018-08-27 VITALS — BP 136/82 | HR 76 | Ht 62.6 in | Wt 194.2 lb

## 2018-08-27 DIAGNOSIS — K219 Gastro-esophageal reflux disease without esophagitis: Secondary | ICD-10-CM

## 2018-08-27 DIAGNOSIS — Z86010 Personal history of colonic polyps: Secondary | ICD-10-CM

## 2018-08-27 DIAGNOSIS — K5900 Constipation, unspecified: Secondary | ICD-10-CM

## 2018-08-27 MED ORDER — DICYCLOMINE HCL 10 MG PO CAPS: ORAL_CAPSULE | ORAL | 2 refills | Status: DC

## 2018-08-27 NOTE — Patient Instructions (Addendum)
Please follow up with Dr Hilarie Fredrickson in 1 year.  Continue Bentyl as needed.  Continue Zantac every evening.  If you are age 63 or older, your body mass index should be between 23-30. Your Body mass index is 34.85 kg/m. If this is out of the aforementioned range listed, please consider follow up with your Primary Care Provider.  If you are age 56 or younger, your body mass index should be between 19-25. Your Body mass index is 34.85 kg/m. If this is out of the aformentioned range listed, please consider follow up with your Primary Care Provider.

## 2018-08-27 NOTE — Progress Notes (Signed)
Subjective:    Patient ID: Gail Baker, female    DOB: November 12, 1956, 62 y.o.   MRN: [id number]  HPI Gail Baker is a 62 year old female with a history of H. pylori status post treatment, history of adenomatous polyps, mild constipation, hypertension and hypothyroidism who is here for follow-up.  She was last seen on 02/17/2018 and presents alone today.  She reports that she has been feeling well.  She does have intermittent issues with constipation though for the vast majority bowels are regular.  Occurring once sometimes twice daily.  When she develops occasional constipation she can skip 1 or even 2 days without bowel movement.  On one such occasion recently when she was constipated she had a large hard stool and saw a red blood with wiping.  This resolved and was painless in nature.  No further bleeding, blood in stool or melena.  She eats mangoes when she feels constipated and this seems to help her regulate her bowel movements.  She will occasionally use Bentyl for crampy discomfort with great benefit.  She noticed epigastric discomfort with almond milk so she has been avoiding this.  Otherwise no recent epigastric abdominal pain.  No nausea or vomiting.  Heartburn is well controlled and she is using ranitidine 150 mg each evening.  No dysphagia or odynophagia.  She sees Dr. Tamala Julian which is helped her with her low back pain.  She is having intermittent issues with her right shoulder discomfort and he feels this may be related to her mattress.  She is considering changing mattresses.   Review of Systems As per HPI, otherwise negative  Current Medications, Allergies, Past Medical History, Past Surgical History, Family History and Social History were reviewed in [employer] record.     Objective:   Physical Exam BP 136/82 (BP Location: Left Arm, Patient Position: Sitting, Cuff Size: Large)   Pulse 76   Ht 5' 2.6" (1.59 m) Comment: height measured without shoes   Wt 194 lb 4 oz (88.1 kg)   BMI 34.85 kg/m  Constitutional: Well-developed and well-nourished. No distress. HEENT: Normocephalic and atraumatic.  No scleral icterus. Neck: Neck supple. Trachea midline. Cardiovascular: Normal rate, regular rhythm and intact distal pulses. No M/R/G Pulmonary/chest: Effort normal and breath sounds normal. No wheezing, rales or rhonchi. Abdominal: Soft, nontender, nondistended. Bowel sounds active throughout. There are no masses palpable. No hepatosplenomegaly. Extremities: no clubbing, cyanosis, or edema Neurological: Alert and oriented to person place and time. Skin: Skin is warm and dry. Psychiatric: Normal mood and affect. Behavior is normal.  CMP     Component Value Date/Time   NA 140 02/03/2018 1016   K 3.9 02/03/2018 1016   CL 105 02/03/2018 1016   CO2 28 02/03/2018 1016   GLUCOSE 87 02/03/2018 1016   BUN 16 02/03/2018 1016   CREATININE 0.76 02/03/2018 1016   CALCIUM 9.3 02/03/2018 1016   PROT 7.5 03/12/2018 0933   ALBUMIN 3.9 03/12/2018 0933   AST 16 03/12/2018 0933   ALT 11 03/12/2018 0933   ALKPHOS 48 03/12/2018 0933   BILITOT 0.4 03/12/2018 0933       Assessment & Plan:  62 year old female with a history of H. pylori status post treatment, history of adenomatous polyps, mild constipation, hypertension and hypothyroidism who is here for follow-up.   1.  Mild constipation --diet controlled and using occasional Bentyl for crampy discomfort.  I recommended that she focus on a high-fiber diet and foods that help her become and maintain  regularity.  We discussed addition of a low-dose laxative and she is opposed at this time.  She is to notify me if she has further issues with constipation and certainly further rectal bleeding.  Rectal bleeding very likely related to internal hemorrhoids which was self-limited.  She is up-to-date with colonoscopy.  2.  GERD --mild and intermittent.  History of H. pylori with confirmation of eradication.   Continue ranitidine 150 mg each evening.  No alarm symptoms.  3.  History of colon polyps --surveillance colonoscopy recommended November 2020  I will see her back in about a year for follow-up and at which point we can schedule surveillance colonoscopy  25 minutes spent with the patient today. Greater than 50% was spent in counseling and coordination of care with the patient

## 2018-08-28 ENCOUNTER — Telehealth: Payer: Self-pay | Admitting: Family Medicine

## 2018-08-28 DIAGNOSIS — Z1239 Encounter for other screening for malignant neoplasm of breast: Secondary | ICD-10-CM

## 2018-08-28 NOTE — Telephone Encounter (Signed)
Order for mammogram

## 2018-08-28 NOTE — Telephone Encounter (Signed)
Copied from [place] [id number]. Topic: Quick Communication - See Telephone Encounter >> Aug 28, 2018  8:35 AM Margot Ables wrote: CRM for notification. See Telephone encounter for: 08/28/18. Pt states she called to schedule her mammogram and was advised to contact her doctor to send in an order. No pain or discomfort, please order for annual screening. Pt goes to [facility].

## 2018-08-28 NOTE — Telephone Encounter (Signed)
Placed order

## 2018-09-10 ENCOUNTER — Ambulatory Visit: Payer: Managed Care, Other (non HMO) | Admitting: Family Medicine

## 2018-09-10 ENCOUNTER — Other Ambulatory Visit: Payer: Self-pay | Admitting: Family Medicine

## 2018-09-11 ENCOUNTER — Ambulatory Visit: Payer: Managed Care, Other (non HMO) | Admitting: Family Medicine

## 2018-09-13 ENCOUNTER — Other Ambulatory Visit: Payer: Self-pay | Admitting: Family Medicine

## 2018-09-15 NOTE — Telephone Encounter (Signed)
Refill done.  

## 2018-09-17 ENCOUNTER — Telehealth: Payer: Self-pay | Admitting: Internal Medicine

## 2018-09-17 MED ORDER — FAMOTIDINE 20 MG PO TABS: 20.0000 mg | ORAL_TABLET | Freq: Every day | ORAL | 6 refills | Status: DC

## 2018-09-17 NOTE — Telephone Encounter (Signed)
Pepcid script sent to pharmacy, pt aware.

## 2018-09-26 ENCOUNTER — Encounter: Payer: Self-pay | Admitting: Family Medicine

## 2018-10-06 ENCOUNTER — Other Ambulatory Visit: Payer: Self-pay | Admitting: Family Medicine

## 2018-10-06 DIAGNOSIS — N6310 Unspecified lump in the right breast, unspecified quadrant: Secondary | ICD-10-CM

## 2018-10-15 ENCOUNTER — Ambulatory Visit
Admission: RE | Admit: 2018-10-15 | Discharge: 2018-10-15 | Disposition: A | Discharge status: Home / Self Care | Payer: Managed Care, Other (non HMO) | Source: Ambulatory Visit | Attending: Family Medicine | Admitting: Family Medicine

## 2018-10-15 DIAGNOSIS — N6310 Unspecified lump in the right breast, unspecified quadrant: Secondary | ICD-10-CM

## 2018-10-24 ENCOUNTER — Other Ambulatory Visit: Payer: Self-pay | Admitting: Family Medicine

## 2018-11-06 ENCOUNTER — Other Ambulatory Visit: Payer: Self-pay | Admitting: Family Medicine

## 2018-12-16 ENCOUNTER — Other Ambulatory Visit: Payer: Self-pay | Admitting: Internal Medicine

## 2018-12-26 ENCOUNTER — Other Ambulatory Visit: Payer: Self-pay | Admitting: Family Medicine

## 2018-12-26 MED ORDER — HYDROCHLOROTHIAZIDE 25 MG PO TABS: 25.0000 mg | ORAL_TABLET | Freq: Every day | ORAL | 0 refills | Status: DC

## 2018-12-26 NOTE — Telephone Encounter (Signed)
Copied from [place] [id number]. Topic: Quick Communication - Rx Refill/Question >> Dec 26, 2018  2:13 PM Leward Quan A wrote: Medication: hydrochlorothiazide (HYDRODIURIL) 25 MG tablet   Has the patient contacted their pharmacy? Yes.   (Agent: If no, request that the patient contact the pharmacy for the refill.) (Agent: If yes, when and what did the pharmacy advise?)  Preferred Pharmacy (with phone number or street name): [place], [place] [phone] (Phone) [phone] (Fax)    Agent: Please be advised that RX refills may take up to 3 business days. We ask that you follow-up with your pharmacy.

## 2019-01-06 NOTE — Progress Notes (Signed)
[name] [place] [place], [place] [address] Phone: [phone] Subjective:    I Kandace Blitz am serving as a Education administrator for Dr. Hulan Saas.   I'm seeing this patient by the request  of:    CC: Bilateral knee and shoulder pain  WVP:[id number]  Madaleine R Holroyd is a 63 y.o. female coming in with complaint of bilateral knee and shoulder pain. Right knee swells and is worse than the left. Sleeping on her sides make her shoulders painful. Has good ROM and strength.   Onset- Chronic  Location- medial anterior  Duration-  Character- "feels as if it will break" (knees), sharp shoulder pain Aggravating factors- Standing, sitting to standing Reliving factors-  Therapies tried- [employer] out of 10 sometimes.  Sometimes makes it very difficult to even get up to start walking from a seated position.  Once he gets walking seems to get a little better.     Past Medical History:  Diagnosis Date  . Asthma   . H. pylori infection   . History of UTI   . Hyperlipidemia   . Hypertension   . Hyperthyroidism   . Pes anserinus bursitis of both knees 07/29/2018  . Tubular adenoma of colon    Past Surgical History:  Procedure Laterality Date  . BREAST BIOPSY Right 08/15/2017   FIBROADENOMATOID CHANGE   . CESAREAN SECTION     Social History   Socioeconomic History  . Marital status: Single    Spouse name: Not on file  . Number of children: 1  . Years of education: Not on file  . Highest education level: Not on file  Occupational History  . Not on file  Social Needs  . Financial resource strain: Not on file  . Food insecurity:    Worry: Not on file    Inability: Not on file  . Transportation needs:    Medical: Not on file    Non-medical: Not on file  Tobacco Use  . Smoking status: Never Smoker  . Smokeless tobacco: Never Used  Substance and Sexual Activity  . Alcohol use: No    Alcohol/week: 0.0 standard drinks  . Drug use: No    . Sexual activity: Not on file  Lifestyle  . Physical activity:    Days per week: Not on file    Minutes per session: Not on file  . Stress: Not on file  Relationships  . Social connections:    Talks on phone: Not on file    Gets together: Not on file    Attends religious service: Not on file    Active member of club or organization: Not on file    Attends meetings of clubs or organizations: Not on file    Relationship status: Not on file  Other Topics Concern  . Not on file  Social History Narrative  . Not on file   No Known Allergies Family History  Problem Relation Age of Onset  . Hypertension Mother   . Hypertension Father   . Diabetes Father   . Colon cancer Neg Hx   . Colon polyps Neg Hx   . Pancreatic cancer Neg Hx   . Rectal cancer Neg Hx   . Stomach cancer Neg Hx   . Breast cancer Neg Hx      Current Outpatient Medications (Cardiovascular):  .  amLODipine (NORVASC) 10 MG tablet, TAKE ONE TABLET BY MOUTH DAILY .  carvedilol (COREG) 12.5 MG tablet, TAKE  ONE TABLET BY MOUTH TWICE A DAY WITH MEALS .  hydrochlorothiazide (HYDRODIURIL) 25 MG tablet, Take 1 tablet (25 mg total) by mouth daily. .  rosuvastatin (CRESTOR) 10 MG tablet, Take 1 tablet (10 mg total) by mouth daily.   Current Outpatient Medications (Analgesics):  .  traMADol (ULTRAM) 50 MG tablet, Take 1 tablet (50 mg total) by mouth every 12 (twelve) hours as needed.   Current Outpatient Medications (Other):  .  dicyclomine (BENTYL) 10 MG capsule, TAKE ONE CAPSULE BY MOUTH THREE TIMES A DAY AS NEEDED FOR CRAMPS .  famotidine (PEPCID) 20 MG tablet, Take 1 tablet (20 mg total) by mouth daily. Marland Kitchen  gabapentin (NEURONTIN) 300 MG capsule, TAKE ONE CAPSULE BY MOUTH EVERY EVENING .  Probiotic Product (DIGESTIVE ADVANTAGE PO), Take by mouth daily. .  ranitidine (ZANTAC) 150 MG tablet, TAKE ONE TABLET BY MOUTH DAILY AS NEEDED FOR HEARTBURN .  Diclofenac Sodium (PENNSAID) 2 % SOLN, Place 2 g onto the skin 2 (two)  times daily.    Past medical history, social, surgical and family history all reviewed in electronic medical record.  No pertanent information unless stated regarding to the chief complaint.   Review of Systems:  No headache, visual changes, nausea, vomiting, diarrhea, constipation, dizziness, abdominal pain, skin rash, fevers, chills, night sweats, weight loss, swollen lymph nodes, body aches, joint swelling,  chest pain, shortness of breath, mood changes.  Positive muscle aches  Objective  Blood pressure 116/80, pulse 88, height 5' 2.6" (1.59 m), weight 196 lb (88.9 kg), SpO2 91 %.    General: No apparent distress alert and oriented x3 mood and affect normal, dressed appropriately.  HEENT: Pupils equal, extraocular movements intact  Respiratory: Patient's speak in full sentences and does not appear short of breath  Cardiovascular: Trace lower extremity edema, non tender, no erythema  Skin: Warm dry intact with no signs of infection or rash on extremities or on axial skeleton.  Abdomen: Soft nontender  Neuro: Cranial nerves II through XII are intact, neurovascularly intact in all extremities with 2+ DTRs and 2+ pulses.  Lymph: No lymphadenopathy of posterior or anterior cervical chain or axillae bilaterally.  Gait antalgic MSK:  tender with full range of motion and good stability and symmetric strength and tone of shoulders, elbows, wrist, hip, and ankles bilaterally.  Knee: Bilateral valgus deformity noted.  Abnormal thigh to calf ratio.  Tender to palpation over medial and PF joint line.  More tenderness though noted actually over the peds anserine bilaterally ROM full in flexion and extension and lower leg rotation. instability with valgus force.  painful patellar compression. Patellar glide with moderate crepitus. Patellar and quadriceps tendons unremarkable. Hamstring and quadriceps strength is normal. Severe overpronation of the hindfoot bilaterally with pes planus  noted  Bilateral shoulders have full range of motion.  Very minimal impingement noted with Hawkins test.  Otherwise unremarkable.   Impression and Recommendations:     This case required medical decision making of moderate complexity. The above documentation has been reviewed and is accurate and complete Lyndal Pulley, DO       Note: This dictation was prepared with Dragon dictation along with smaller phrase technology. Any transcriptional errors that result from this process are unintentional.

## 2019-01-07 ENCOUNTER — Ambulatory Visit: Payer: Managed Care, Other (non HMO) | Admitting: Family Medicine

## 2019-01-07 ENCOUNTER — Ambulatory Visit (INDEPENDENT_AMBULATORY_CARE_PROVIDER_SITE_OTHER)
Admission: RE | Admit: 2019-01-07 | Discharge: 2019-01-07 | Disposition: A | Discharge status: Home / Self Care | Payer: Managed Care, Other (non HMO) | Source: Ambulatory Visit | Attending: Family Medicine | Admitting: Family Medicine

## 2019-01-07 ENCOUNTER — Encounter: Payer: Self-pay | Admitting: Family Medicine

## 2019-01-07 VITALS — BP 116/80 | HR 88 | Ht 62.6 in | Wt 196.0 lb

## 2019-01-07 DIAGNOSIS — M25511 Pain in right shoulder: Secondary | ICD-10-CM | POA: Insufficient documentation

## 2019-01-07 DIAGNOSIS — M25561 Pain in right knee: Secondary | ICD-10-CM

## 2019-01-07 DIAGNOSIS — M25512 Pain in left shoulder: Secondary | ICD-10-CM

## 2019-01-07 DIAGNOSIS — M25562 Pain in left knee: Secondary | ICD-10-CM | POA: Diagnosis not present

## 2019-01-07 DIAGNOSIS — G8929 Other chronic pain: Secondary | ICD-10-CM

## 2019-01-07 DIAGNOSIS — M2142 Flat foot [pes planus] (acquired), left foot: Secondary | ICD-10-CM

## 2019-01-07 DIAGNOSIS — M2141 Flat foot [pes planus] (acquired), right foot: Secondary | ICD-10-CM

## 2019-01-07 DIAGNOSIS — M2140 Flat foot [pes planus] (acquired), unspecified foot: Secondary | ICD-10-CM | POA: Insufficient documentation

## 2019-01-07 DIAGNOSIS — M7052 Other bursitis of knee, left knee: Secondary | ICD-10-CM

## 2019-01-07 DIAGNOSIS — M48062 Spinal stenosis, lumbar region with neurogenic claudication: Secondary | ICD-10-CM

## 2019-01-07 DIAGNOSIS — M7051 Other bursitis of knee, right knee: Secondary | ICD-10-CM

## 2019-01-07 MED ORDER — DICLOFENAC SODIUM 2 % TD SOLN: 2.0000 g | Freq: Two times a day (BID) | TRANSDERMAL | 3 refills | Status: DC

## 2019-01-07 NOTE — Assessment & Plan Note (Signed)
History of stenosis.  Doing really well with the gabapentin.  No significant changes.  I do believe that the alignment of patient's feet and knees can be beneficial as well.  Follow-up again in 4 to 6 weeks

## 2019-01-07 NOTE — Assessment & Plan Note (Signed)
Pes planus bilaterally.  Patient gone to get over-the-counter orthotics.  Likely better alignment could be potentially helpful.  We will consider custom orthotics if necessary.

## 2019-01-07 NOTE — Patient Instructions (Signed)
Good to see you  Pes anserine bursitis.  Exercises 3 times a week.  Thigh compression sleeve bilaterally can help with the constant pulling  Try to shorten stride length  With walking  [employer] orthotics "total support" online would be great  Continue the vitamins For the shoulders lets give it more time, Keep hands within peripheral vison  See me again in 6-8 weeks Xray downstairs Happy New Year!

## 2019-01-07 NOTE — Assessment & Plan Note (Signed)
Agree with likely peds anserine bursitis with patient having a pain on the medial aspect of the knees.  I do believe that there is some underlying osteoarthritic changes that is also likely contributing to some of the discomfort and pain.  Patient does have pes planus with overpronation of the hindfoot that contributes to medial load.  We discussed home exercise, icing regimen, topical anti-inflammatories.  Discussed easing gait changes and over-the-counter orthotics that could be beneficial.  Follow-up again in 4 to 6 weeks.  Worsening pain consider formal physical therapy or injections

## 2019-01-07 NOTE — Assessment & Plan Note (Signed)
Very nonspecific overall.  Patient on exam today did not show any significant impingement.  No weakness of the rotator cuff.  We will monitor.  Patient will avoid certain stretching that seems to be contributing to some of the discomfort and pain.

## 2019-02-02 ENCOUNTER — Ambulatory Visit: Payer: Managed Care, Other (non HMO) | Admitting: Family Medicine

## 2019-02-05 ENCOUNTER — Other Ambulatory Visit: Payer: Self-pay | Admitting: Family Medicine

## 2019-02-18 ENCOUNTER — Ambulatory Visit: Payer: Managed Care, Other (non HMO) | Admitting: Family Medicine

## 2019-02-20 ENCOUNTER — Other Ambulatory Visit: Payer: Self-pay | Admitting: Internal Medicine

## 2019-03-04 ENCOUNTER — Ambulatory Visit: Payer: Managed Care, Other (non HMO) | Admitting: Family Medicine

## 2019-03-04 VITALS — BP 132/94 | HR 59 | Ht 62.0 in | Wt 196.0 lb

## 2019-03-04 DIAGNOSIS — M25552 Pain in left hip: Secondary | ICD-10-CM

## 2019-03-04 DIAGNOSIS — M48062 Spinal stenosis, lumbar region with neurogenic claudication: Secondary | ICD-10-CM

## 2019-03-04 NOTE — Patient Instructions (Addendum)
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 Usually after activity and before bed. pennsaid pinkie amount topically 2 times daily as needed.  Exercises 3 times a week.  Xrays downstairs See me again in 6 weeks if not completely gone.

## 2019-03-04 NOTE — Assessment & Plan Note (Signed)
Left hip pain.  I do believe that some of it is secondary to malalignment.  There is a possibility for tensor fascia lata syndrome.  Discussed with patient about home exercises and patient work with Product/process development scientist to learn them in greater detail.  Discussed importance of weight loss, differential would include lumbar radiculopathy but I think not as likely with patient's further work-up on this previously.  Discussed topical anti-inflammatories and icing regimen.  Follow-up again 4 to 6 weeks x-rays are also ordered to rule out any bony abnormality that could be contributing.

## 2019-03-04 NOTE — Progress Notes (Signed)
[name] [address] [place], [place] [address] Phone: [phone] Subjective:     CC: Left anterior hip pain  CBU:[id number]    Update 03/04/2019: Gail Baker is a 63 y.o. female coming in with complaint of anterior left hip pain. Has pain with prolonged sitting. Pain is achy but goes away with walking. Denies any radiating symptoms. Has been having pain for 2 weeks. Used Tylenol once.  Patient has been wearing the orthotics on a more regular basis and feels like this is helped some alignment     Past Medical History:  Diagnosis Date  . Asthma   . H. pylori infection   . History of UTI   . Hyperlipidemia   . Hypertension   . Hyperthyroidism   . Pes anserinus bursitis of both knees 07/29/2018  . Tubular adenoma of colon    Past Surgical History:  Procedure Laterality Date  . BREAST BIOPSY Right 08/15/2017   FIBROADENOMATOID CHANGE   . CESAREAN SECTION     Social History   Socioeconomic History  . Marital status: Single    Spouse name: Not on file  . Number of children: 1  . Years of education: Not on file  . Highest education level: Not on file  Occupational History  . Not on file  Social Needs  . Financial resource strain: Not on file  . Food insecurity:    Worry: Not on file    Inability: Not on file  . Transportation needs:    Medical: Not on file    Non-medical: Not on file  Tobacco Use  . Smoking status: Never Smoker  . Smokeless tobacco: Never Used  Substance and Sexual Activity  . Alcohol use: No    Alcohol/week: 0.0 standard drinks  . Drug use: No  . Sexual activity: Not on file  Lifestyle  . Physical activity:    Days per week: Not on file    Minutes per session: Not on file  . Stress: Not on file  Relationships  . Social connections:    Talks on phone: Not on file    Gets together: Not on file    Attends religious service: Not on file    Active member of club or organization: Not on file    Attends  meetings of clubs or organizations: Not on file    Relationship status: Not on file  Other Topics Concern  . Not on file  Social History Narrative  . Not on file   No Known Allergies Family History  Problem Relation Age of Onset  . Hypertension Mother   . Hypertension Father   . Diabetes Father   . Colon cancer Neg Hx   . Colon polyps Neg Hx   . Pancreatic cancer Neg Hx   . Rectal cancer Neg Hx   . Stomach cancer Neg Hx   . Breast cancer Neg Hx      Current Outpatient Medications (Cardiovascular):  .  amLODipine (NORVASC) 10 MG tablet, TAKE ONE TABLET BY MOUTH DAILY .  carvedilol (COREG) 12.5 MG tablet, TAKE ONE TABLET BY MOUTH TWICE A DAY WITH MEALS .  hydrochlorothiazide (HYDRODIURIL) 25 MG tablet, Take 1 tablet (25 mg total) by mouth daily. .  rosuvastatin (CRESTOR) 10 MG tablet, TAKE ONE TABLET BY MOUTH DAILY   Current Outpatient Medications (Analgesics):  .  traMADol (ULTRAM) 50 MG tablet, Take 1 tablet (50 mg total) by mouth every 12 (twelve) hours as needed.  Current Outpatient Medications (Other):  Marland Kitchen  Diclofenac Sodium (PENNSAID) 2 % SOLN, Place 2 g onto the skin 2 (two) times daily. Marland Kitchen  dicyclomine (BENTYL) 10 MG capsule, TAKE ONE CAPSULE BY MOUTH THREE TIMES A DAY AS NEEDED FOR CRAMPS .  famotidine (PEPCID) 20 MG tablet, Take 1 tablet (20 mg total) by mouth daily. Marland Kitchen  gabapentin (NEURONTIN) 300 MG capsule, TAKE ONE CAPSULE BY MOUTH EVERY EVENING .  Probiotic Product (DIGESTIVE ADVANTAGE PO), Take by mouth daily. .  ranitidine (ZANTAC) 150 MG tablet, TAKE ONE TABLET BY MOUTH DAILY AS NEEDED FOR HEARTBURN    Past medical history, social, surgical and family history all reviewed in electronic medical record.  No pertanent information unless stated regarding to the chief complaint.   Review of Systems:  No headache, visual changes, nausea, vomiting, diarrhea, constipation, dizziness, abdominal pain, skin rash, fevers, chills, night sweats, weight loss, swollen lymph  nodes, body aches, joint swelling, chest pain, shortness of breath, mood changes.  Positive muscle aches   Objective  Blood pressure (!) 132/94, pulse (!) 59, height 5\' 2"  (1.575 m), weight 196 lb (88.9 kg).    General: No apparent distress alert and oriented x3 mood and affect normal, dressed appropriately.  HEENT: Pupils equal, extraocular movements intact  Respiratory: Patient's speak in full sentences and does not appear short of breath  Cardiovascular: No lower extremity edema, non tender, no erythema  Skin: Warm dry intact with no signs of infection or rash on extremities or on axial skeleton.  Abdomen: Soft nontender  Neuro: Cranial nerves II through XII are intact, neurovascularly intact in all extremities with 2+ DTRs and 2+ pulses.  Lymph: No lymphadenopathy of posterior or anterior cervical chain or axillae bilaterally.  Gait antalgic gait MSK:  Non tender with full range of motion and good stability and symmetric strength and tone of shoulders, elbows, wrist, and ankles bilaterally.  Arthritic changes of the knees bilaterally Left hip exam has near full range of motion.  Patient does have 4+ out of 5 strength of the hip flexor compared to the contralateral side.  Negative straight leg test.  No signs of pain with internal range of motion.  Even with palpation unable to elicit any type of discomfort at this moment.  Back exam still has some tenderness to palpation in the paraspinal musculature of the lumbar spine.  [id number]; 15 additional minutes spent for Therapeutic exercises as stated in above notes.  This included exercises focusing on stretching, strengthening, with significant focus on eccentric aspects.   Long term goals include an improvement in range of motion, strength, endurance as well as avoiding reinjury. Patient's frequency would include in 1-2 times a day, 3-5 times a week for a duration of 6-12 weeks.  Hip strengthening exercises which included:  Pelvic tilt/bracing to  help with proper recruitment of the lower abs and pelvic floor muscles  Glute strengthening to properly contract glutes without over-engaging low back and hamstrings - prone hip extension and glute bridge exercises Proper stretching techniques to increase effectiveness for the hip flexors, groin, quads, piriformic and low back when appropriate  Proper technique shown and discussed handout in great detail with ATC.  All questions were discussed and answered.      Impression and Recommendations:     This case required medical decision making of moderate complexity. The above documentation has been reviewed and is accurate and complete Lyndal Pulley, DO       Note: This dictation was prepared with  Dragon dictation along with smaller Company secretary. Any transcriptional errors that result from this process are unintentional.

## 2019-03-05 ENCOUNTER — Ambulatory Visit (INDEPENDENT_AMBULATORY_CARE_PROVIDER_SITE_OTHER)
Admission: RE | Admit: 2019-03-05 | Discharge: 2019-03-05 | Disposition: A | Discharge status: Home / Self Care | Payer: Managed Care, Other (non HMO) | Source: Ambulatory Visit | Attending: Family Medicine | Admitting: Family Medicine

## 2019-03-05 DIAGNOSIS — M25552 Pain in left hip: Secondary | ICD-10-CM

## 2019-03-17 ENCOUNTER — Other Ambulatory Visit: Payer: Self-pay | Admitting: Family Medicine

## 2019-03-25 ENCOUNTER — Other Ambulatory Visit: Payer: Self-pay | Admitting: Family Medicine

## 2019-04-01 ENCOUNTER — Other Ambulatory Visit: Payer: Self-pay

## 2019-04-01 ENCOUNTER — Ambulatory Visit (INDEPENDENT_AMBULATORY_CARE_PROVIDER_SITE_OTHER): Payer: Managed Care, Other (non HMO) | Admitting: Family Medicine

## 2019-04-01 ENCOUNTER — Telehealth: Payer: Self-pay | Admitting: Family Medicine

## 2019-04-01 ENCOUNTER — Encounter: Payer: Self-pay | Admitting: Family Medicine

## 2019-04-01 ENCOUNTER — Ambulatory Visit: Payer: Managed Care, Other (non HMO) | Admitting: Family Medicine

## 2019-04-01 DIAGNOSIS — E785 Hyperlipidemia, unspecified: Secondary | ICD-10-CM

## 2019-04-01 DIAGNOSIS — M7051 Other bursitis of knee, right knee: Secondary | ICD-10-CM | POA: Diagnosis not present

## 2019-04-01 DIAGNOSIS — K219 Gastro-esophageal reflux disease without esophagitis: Secondary | ICD-10-CM | POA: Diagnosis not present

## 2019-04-01 DIAGNOSIS — I10 Essential (primary) hypertension: Secondary | ICD-10-CM

## 2019-04-01 DIAGNOSIS — M7052 Other bursitis of knee, left knee: Secondary | ICD-10-CM

## 2019-04-01 HISTORY — DX: Gastro-esophageal reflux disease without esophagitis: K21.9

## 2019-04-01 MED ORDER — TRAMADOL HCL 50 MG PO TABS: 50.0000 mg | ORAL_TABLET | Freq: Two times a day (BID) | ORAL | 0 refills | Status: DC | PRN

## 2019-04-01 NOTE — Telephone Encounter (Signed)
The patient was seen for a telehealth visit.  She needs to come in for lab work in the next 1 to 2 weeks.  She also needs a 45-month office visit scheduled.

## 2019-04-01 NOTE — Progress Notes (Signed)
Virtual Visit via Telephone Note  I connected with Gail Baker on 04/01/19 at  8:00 AM EDT by telephone and verified that I am speaking with the correct person using two identifiers.   I discussed the limitations, risks, security and privacy concerns of performing an evaluation and management service by telephone and the availability of in person appointments. I also discussed with the patient that there may be a patient responsible charge related to this service. The patient expressed understanding and agreed to proceed.  The patient Gail Baker) was at home and I Tommi Rumps) was in the office. We were the participants for this visit.    History of Present Illness: Hypertension: Running less than 130/80.  Taking carvedilol, amlodipine, and HCTZ.  No chest pain, shortness of breath, or edema.  Hyperlipidemia: Taking Crestor.  No right upper quadrant pain or myalgias.  GERD: She notes no reflux symptoms.  She is taking famotidine.  She did have some discomfort in the past with drinking soy milk though she has not had any recurrence of that.  Bilateral knee pain: Patient notes she has seen sports medicine for this and she has been doing exercises which have been beneficial.  She is using gabapentin, tumeric, and tart cherry.  She occasionally takes tramadol for this.  The tramadol does make her mildly drowsy.  She does not drive when taking the tramadol.   Observations/Objective: This was a telehealth phone visit.  No physical exam was completed.  Assessment and Plan: Hypertension Adequately controlled.  She will continue current medication.  We will have her come in for lab work.  Hyperlipidemia Continue Crestor.  She will come in for a lipid panel.  Pes anserinus bursitis of both knees Patient is doing well with this.  Refill of tramadol given.  Discussed risk of drowsiness with this and not driving if she was drowsy from this.  GERD (gastroesophageal reflux  disease) Continue famotidine.    Follow Up Instructions: Patient will come in for labs in 1 to 2 weeks.  CMA will contact her to set this up.  Follow-up in the office in 6 months.   I discussed the assessment and treatment plan with the patient. The patient was provided an opportunity to ask questions and all were answered. The patient agreed with the plan and demonstrated an understanding of the instructions.   The patient was advised to call back or seek an in-person evaluation if the symptoms worsen or if the condition fails to improve as anticipated.  I provided 16 minutes of non-face-to-face time during this encounter.   Tommi Rumps, MD

## 2019-04-01 NOTE — Assessment & Plan Note (Signed)
Adequately controlled.  She will continue current medication.  We will have her come in for lab work.

## 2019-04-01 NOTE — Telephone Encounter (Signed)
Called and spoke with pt. Pt has been scheduled for lab work on April 15,2020 @ 8:30 AM and scheduled for the 6 month follow up on October 14,2020 @ 8:00 AM.

## 2019-04-01 NOTE — Assessment & Plan Note (Signed)
Continue Crestor.  She will come in for a lipid panel.

## 2019-04-01 NOTE — Assessment & Plan Note (Signed)
Patient is doing well with this.  Refill of tramadol given.  Discussed risk of drowsiness with this and not driving if she was drowsy from this.

## 2019-04-01 NOTE — Assessment & Plan Note (Signed)
Continue famotidine 

## 2019-04-08 ENCOUNTER — Ambulatory Visit: Payer: Managed Care, Other (non HMO) | Admitting: Family Medicine

## 2019-04-12 ENCOUNTER — Other Ambulatory Visit: Payer: Self-pay | Admitting: Internal Medicine

## 2019-04-15 ENCOUNTER — Ambulatory Visit: Payer: Managed Care, Other (non HMO) | Admitting: Family Medicine

## 2019-04-20 ENCOUNTER — Other Ambulatory Visit: Payer: Self-pay | Admitting: Family Medicine

## 2019-04-22 ENCOUNTER — Other Ambulatory Visit: Payer: Self-pay

## 2019-04-22 ENCOUNTER — Other Ambulatory Visit (INDEPENDENT_AMBULATORY_CARE_PROVIDER_SITE_OTHER): Payer: Managed Care, Other (non HMO)

## 2019-04-22 DIAGNOSIS — E785 Hyperlipidemia, unspecified: Secondary | ICD-10-CM | POA: Diagnosis not present

## 2019-04-22 DIAGNOSIS — I10 Essential (primary) hypertension: Secondary | ICD-10-CM | POA: Diagnosis not present

## 2019-04-22 LAB — LIPID PANEL
Cholesterol: 129 mg/dL (ref 0–200)
HDL: 53.9 mg/dL (ref >39.00)
LDL Cholesterol: 69 mg/dL (ref 0–99)
NonHDL: 74.75
Total CHOL/HDL Ratio: 2
Triglycerides: 31 mg/dL (ref 0.0–149.0)
VLDL: 6.2 mg/dL (ref 0.0–40.0)

## 2019-04-22 LAB — COMPREHENSIVE METABOLIC PANEL
ALT: 15 U/L (ref 0–35)
AST: 20 U/L (ref 0–37)
Albumin: 4 g/dL (ref 3.5–5.2)
Alkaline Phosphatase: 56 U/L (ref 39–117)
BUN: 13 mg/dL (ref 6–23)
CO2: 29 mEq/L (ref 19–32)
Calcium: 9.1 mg/dL (ref 8.4–10.5)
Chloride: 105 mEq/L (ref 96–112)
Creatinine, Ser: 0.69 mg/dL (ref 0.40–1.20)
GFR: 103.57 mL/min (ref >60.00)
Glucose, Bld: 85 mg/dL (ref 70–99)
Potassium: 3.9 mEq/L (ref 3.5–5.1)
Sodium: 140 mEq/L (ref 135–145)
Total Bilirubin: 0.5 mg/dL (ref 0.2–1.2)
Total Protein: 7.5 g/dL (ref 6.0–8.3)

## 2019-05-06 ENCOUNTER — Other Ambulatory Visit: Payer: Self-pay

## 2019-05-06 ENCOUNTER — Other Ambulatory Visit: Payer: Self-pay | Admitting: Family Medicine

## 2019-05-06 ENCOUNTER — Telehealth: Payer: Self-pay

## 2019-05-06 MED ORDER — DICLOFENAC SODIUM 2 % TD SOLN: 2.0000 g | Freq: Two times a day (BID) | TRANSDERMAL | 3 refills | Status: AC

## 2019-05-06 NOTE — Telephone Encounter (Signed)
Copied from [place] [id number]. Topic: General - Other >> May 06, 2019 11:03 AM Gustavus Messing wrote: Reason for CRM: Patient needs Dr. Thompson Caul nurse to call her back because she is having trouble getting her prescriptions filled. She stated that the pharmacy needs more information. She is a patient of Dr. Ellen Henri but is seeing Dr. Tamala Julian for a sports medicine injury. Tried the practice 3 times with no luck.

## 2019-05-06 NOTE — Telephone Encounter (Signed)
Spoke with patient. She would like a refill on [facility]. Rx refilled.

## 2019-05-14 ENCOUNTER — Other Ambulatory Visit: Payer: Self-pay | Admitting: Family Medicine

## 2019-05-20 ENCOUNTER — Other Ambulatory Visit: Payer: Self-pay | Admitting: Family Medicine

## 2019-05-28 ENCOUNTER — Other Ambulatory Visit: Payer: Self-pay | Admitting: Internal Medicine

## 2019-06-05 ENCOUNTER — Ambulatory Visit (INDEPENDENT_AMBULATORY_CARE_PROVIDER_SITE_OTHER)
Admission: RE | Admit: 2019-06-05 | Discharge: 2019-06-05 | Disposition: A | Discharge status: Home / Self Care | Payer: Managed Care, Other (non HMO) | Source: Ambulatory Visit | Attending: Family Medicine | Admitting: Family Medicine

## 2019-06-05 ENCOUNTER — Encounter: Payer: Self-pay | Admitting: Family Medicine

## 2019-06-05 ENCOUNTER — Other Ambulatory Visit: Payer: Self-pay

## 2019-06-05 ENCOUNTER — Ambulatory Visit (INDEPENDENT_AMBULATORY_CARE_PROVIDER_SITE_OTHER): Payer: Managed Care, Other (non HMO) | Admitting: Family Medicine

## 2019-06-05 ENCOUNTER — Telehealth: Payer: Self-pay

## 2019-06-05 VITALS — BP 130/78 | HR 71 | Resp 16

## 2019-06-05 DIAGNOSIS — M79674 Pain in right toe(s): Secondary | ICD-10-CM

## 2019-06-05 NOTE — Telephone Encounter (Signed)
Copied from [place] [id number]. Topic: General - Other >> Jun 05, 2019  1:10 PM Gustavus Messing wrote: Reason for CRM: The patient would like a call back from Dr. Thompson Caul nurse. The patient did not state what it is regarding.

## 2019-06-05 NOTE — Progress Notes (Signed)
GETSEMANI LINDON - 63 y.o. female MRN [id number]  Date of birth: [birth year]  SUBJECTIVE:  Including CC & ROS.  No chief complaint on file.   Gail Baker is a 63 y.o. female that is  Presenting with acute right pinky toe pain. The pain is sharp and throbbing. She hit the pinky toe on the bed frame. The pain is worse with walking. She has noticed some swelling. Pain occurring on the lateral aspect of the MTP joint. .    Review of Systems  Constitutional: Negative for fever.  HENT: Negative for congestion.   Respiratory: Negative for cough.   Cardiovascular: Negative for chest pain.  Gastrointestinal: Negative for abdominal pain.  Musculoskeletal: Positive for gait problem.  Skin: Negative for color change.  Neurological: Negative for weakness.  Hematological: Negative for adenopathy.  Psychiatric/Behavioral: Negative for agitation.    HISTORY: Past Medical, Surgical, Social, and Family History Reviewed & Updated per EMR.   Pertinent Historical Findings include:  Past Medical History:  Diagnosis Date   Asthma    GERD (gastroesophageal reflux disease) 04/01/2019   H. pylori infection    History of UTI    Hyperlipidemia    Hypertension    Hyperthyroidism    Pes anserinus bursitis of both knees 07/29/2018   Tubular adenoma of colon     Past Surgical History:  Procedure Laterality Date   BREAST BIOPSY Right 08/15/2017   FIBROADENOMATOID CHANGE    CESAREAN SECTION      No Known Allergies  Family History  Problem Relation Age of Onset   Hypertension Mother    Hypertension Father    Diabetes Father    Colon cancer Neg Hx    Colon polyps Neg Hx    Pancreatic cancer Neg Hx    Rectal cancer Neg Hx    Stomach cancer Neg Hx    Breast cancer Neg Hx      Social History   Socioeconomic History   Marital status: Single    Spouse name: Not on file   Number of children: 1   Years of education: Not on file   Highest education level: Not on  file  Occupational History   Not on file  Social Needs   Financial resource strain: Not on file   Food insecurity:    Worry: Not on file    Inability: Not on file   Transportation needs:    Medical: Not on file    Non-medical: Not on file  Tobacco Use   Smoking status: Never Smoker   Smokeless tobacco: Never Used  Substance and Sexual Activity   Alcohol use: No    Alcohol/week: 0.0 standard drinks   Drug use: No   Sexual activity: Not on file  Lifestyle   Physical activity:    Days per week: Not on file    Minutes per session: Not on file   Stress: Not on file  Relationships   Social connections:    Talks on phone: Not on file    Gets together: Not on file    Attends religious service: Not on file    Active member of club or organization: Not on file    Attends meetings of clubs or organizations: Not on file    Relationship status: Not on file   Intimate partner violence:    Fear of current or ex partner: Not on file    Emotionally abused: Not on file    Physically abused: Not on file  Forced sexual activity: Not on file  Other Topics Concern   Not on file  Social History Narrative   Not on file     PHYSICAL EXAM:  VS: BP 130/78    Pulse 71    Resp 16    SpO2 98%  Physical Exam Gen: NAD, alert, cooperative with exam, well-appearing ENT: normal lips, normal nasal mucosa,  Eye: normal EOM, normal conjunctiva and lids CV:  no edema, +2 pedal pulses   Resp: no accessory muscle use, non-labored,  Skin: no rashes, no areas of induration  Neuro: normal tone, normal sensation to touch Psych:  normal insight, alert and oriented MSK:  Right foot:  TTP at the 5th MTP joint  No significant ecchymosis or swelling  No TTP at the base of the 5th.  Neurovascularly intact      ASSESSMENT & PLAN:   Toe pain, right Possible contusion vs fracture.  - counseled on hard soled shoe  - counseled on supportive care - xray

## 2019-06-05 NOTE — Telephone Encounter (Signed)
Pt stated she has seen Dr. Tamala Julian and does not need to see Dr. Caryl Bis any longer. Please advise.

## 2019-06-05 NOTE — Patient Instructions (Signed)
Nice to meet you Please try ice on the toe  Please try the stiff soled shoe I will call you with the results from today   Please send me a message in MyChart with any questions or updates.  Please see me back in 2 weeks if no better.   --Dr. Raeford Razor

## 2019-06-08 ENCOUNTER — Telehealth: Payer: Self-pay | Admitting: Family Medicine

## 2019-06-08 DIAGNOSIS — M79674 Pain in right toe(s): Secondary | ICD-10-CM | POA: Insufficient documentation

## 2019-06-08 NOTE — Telephone Encounter (Signed)
Left VM for patient. If she calls back please have her speak with a nurse/CMA and inform that the xray shows a fracture of her pinky toe. She can budd tape the two toes together and use a hard soled shoe to help with the pain. She can do this for about two weeks or until the pain stops. The [facility] can report results to patient.   If any questions then please take the best time and phone number to call and I will try to call her back.   Rosemarie Ax, MD [facility] 06/08/2019, 8:59 AM

## 2019-06-08 NOTE — Telephone Encounter (Signed)
Wrong office.  Nina,cma

## 2019-06-08 NOTE — Assessment & Plan Note (Signed)
Possible contusion vs fracture.  - counseled on hard soled shoe  - counseled on supportive care - xray

## 2019-06-19 ENCOUNTER — Other Ambulatory Visit: Payer: Self-pay | Admitting: Family Medicine

## 2019-06-22 ENCOUNTER — Other Ambulatory Visit: Payer: Self-pay | Admitting: Family Medicine

## 2019-07-23 ENCOUNTER — Other Ambulatory Visit: Payer: Self-pay | Admitting: Family Medicine

## 2019-08-01 ENCOUNTER — Other Ambulatory Visit: Payer: Self-pay | Admitting: Family Medicine

## 2019-08-04 ENCOUNTER — Other Ambulatory Visit: Payer: Self-pay | Admitting: Internal Medicine

## 2019-08-05 ENCOUNTER — Other Ambulatory Visit: Payer: Self-pay

## 2019-08-05 ENCOUNTER — Encounter: Payer: Self-pay | Admitting: Family Medicine

## 2019-08-05 ENCOUNTER — Ambulatory Visit (INDEPENDENT_AMBULATORY_CARE_PROVIDER_SITE_OTHER): Payer: Managed Care, Other (non HMO) | Admitting: Family Medicine

## 2019-08-05 DIAGNOSIS — M48062 Spinal stenosis, lumbar region with neurogenic claudication: Secondary | ICD-10-CM

## 2019-08-05 DIAGNOSIS — M170 Bilateral primary osteoarthritis of knee: Secondary | ICD-10-CM | POA: Insufficient documentation

## 2019-08-05 NOTE — Patient Instructions (Signed)
Good to see you  Ice is your friend Injected both knees and expect everything to feel better  Stay active Get back to the good shoes.  See me again in 6-8 weeks

## 2019-08-05 NOTE — Assessment & Plan Note (Signed)
Bilateral injections given today.  Discussed icing regimen and home exercise.  Discussed which activities to do which wants to avoid.  Patient could be a candidate for Visco supplementation.  We will see if we can get approval.  Discussed icing regimen, patient will continue with topical anti-inflammatories with helpful.  Follow-up again in 4 to 8 weeks

## 2019-08-05 NOTE — Progress Notes (Signed)
[facility] [place], [place] [address] Phone: [phone] Subjective:   [name], am serving as a scribe for Dr. Hulan Saas.\ I'm seeing this patient by the request  of:    CC: Bilateral knee and back pain  [id number]   03/04/2019 Left hip pain.  I do believe that some of it is secondary to malalignment.  There is a possibility for tensor fascia lata syndrome.  Discussed with patient about home exercises and patient work with Product/process development scientist to learn them in greater detail.  Discussed importance of weight loss, differential would include lumbar radiculopathy but I think not as likely with patient's further work-up on this previously.  Discussed topical anti-inflammatories and icing regimen.  Follow-up again 4 to 6 weeks x-rays are also ordered to rule out any bony abnormality that could be contributing.  Update 08/05/2019 Gail Baker is a 63 y.o. female coming in with complaint of bilateral knee pain. R>L. Pain is constant. Pain over patellar tendon. Patient has been using Tramadol.   Chronic back pain. Pain switches from left to right. Intermittent radiating symptoms.      Past Medical History:  Diagnosis Date  . Asthma   . GERD (gastroesophageal reflux disease) 04/01/2019  . H. pylori infection   . History of UTI   . Hyperlipidemia   . Hypertension   . Hyperthyroidism   . Pes anserinus bursitis of both knees 07/29/2018  . Tubular adenoma of colon    Past Surgical History:  Procedure Laterality Date  . BREAST BIOPSY Right 08/15/2017   FIBROADENOMATOID CHANGE   . CESAREAN SECTION     Social History   Socioeconomic History  . Marital status: Single    Spouse name: Not on file  . Number of children: 1  . Years of education: Not on file  . Highest education level: Not on file  Occupational History  . Not on file  Social Needs  . Financial resource strain: Not on file  . Food insecurity    Worry: Not on file   Inability: Not on file  . Transportation needs    Medical: Not on file    Non-medical: Not on file  Tobacco Use  . Smoking status: Never Smoker  . Smokeless tobacco: Never Used  Substance and Sexual Activity  . Alcohol use: No    Alcohol/week: 0.0 standard drinks  . Drug use: No  . Sexual activity: Not on file  Lifestyle  . Physical activity    Days per week: Not on file    Minutes per session: Not on file  . Stress: Not on file  Relationships  . Social Herbalist on phone: Not on file    Gets together: Not on file    Attends religious service: Not on file    Active member of club or organization: Not on file    Attends meetings of clubs or organizations: Not on file    Relationship status: Not on file  Other Topics Concern  . Not on file  Social History Narrative  . Not on file   No Known Allergies Family History  Problem Relation Age of Onset  . Hypertension Mother   . Hypertension Father   . Diabetes Father   . Colon cancer Neg Hx   . Colon polyps Neg Hx   . Pancreatic cancer Neg Hx   . Rectal cancer Neg Hx   . Stomach cancer Neg Hx   .  Breast cancer Neg Hx      Current Outpatient Medications (Cardiovascular):  .  amLODipine (NORVASC) 10 MG tablet, TAKE ONE TABLET BY MOUTH DAILY .  carvedilol (COREG) 12.5 MG tablet, TAKE ONE TABLET BY MOUTH TWICE A DAY WITH MEALS .  hydrochlorothiazide (HYDRODIURIL) 25 MG tablet, TAKE ONE TABLET BY MOUTH DAILY .  rosuvastatin (CRESTOR) 10 MG tablet, TAKE ONE TABLET BY MOUTH DAILY   Current Outpatient Medications (Analgesics):  .  traMADol (ULTRAM) 50 MG tablet, Take 1 tablet (50 mg total) by mouth every 12 (twelve) hours as needed.   Current Outpatient Medications (Other):  Marland Kitchen  Diclofenac Sodium (PENNSAID) 2 % SOLN, Place 2 g onto the skin 2 (two) times daily. Marland Kitchen  dicyclomine (BENTYL) 10 MG capsule, TAKE ONE CAPSULE BY MOUTH THREE TIMES A DAY AS NEEDED FOR CRAMPS .  famotidine (PEPCID) 20 MG tablet, TAKE ONE  TABLET BY MOUTH DAILY .  gabapentin (NEURONTIN) 300 MG capsule, TAKE ONE CAPSULE BY MOUTH EVERY EVENING .  Probiotic Product (DIGESTIVE ADVANTAGE PO), Take by mouth daily. .  ranitidine (ZANTAC) 150 MG tablet, TAKE ONE TABLET BY MOUTH DAILY AS NEEDED FOR HEARTBURN    Past medical history, social, surgical and family history all reviewed in electronic medical record.  No pertanent information unless stated regarding to the chief complaint.   Review of Systems:  No headache, visual changes, nausea, vomiting, diarrhea, constipation, dizziness, abdominal pain, skin rash, fevers, chills, night sweats, weight loss, swollen lymph nodes, body aches, joint swelling,  chest pain, shortness of breath, mood changes.  Positive muscle aches  Objective  Blood pressure 118/62, pulse 68, height 5\' 2"  (1.575 m), weight 198 lb (89.8 kg), SpO2 99 %.    General: No apparent distress alert and oriented x3 mood and affect normal, dressed appropriately.  HEENT: Pupils equal, extraocular movements intact  Respiratory: Patient's speak in full sentences and does not appear short of breath  Cardiovascular: No lower extremity edema, non tender, no erythema  Skin: Warm dry intact with no signs of infection or rash on extremities or on axial skeleton.  Abdomen: Soft nontender  Neuro: Cranial nerves II through XII are intact, neurovascularly intact in all extremities with 2+ DTRs and 2+ pulses.  Lymph: No lymphadenopathy of posterior or anterior cervical chain or axillae bilaterally.  Gait antalgic.  MSK:  tender with full range of motion and good stability and symmetric strength and tone of shoulders, elbows, wrist, hip, and ankles bilaterally.  Bilateral knees does have valgus deformity of the knees.Patient does have some instability with valgus force.  Tender to palpation over the patellofemoral and the medial joint line.  Lacks the last 5 degrees of extension but full flexion.  Crepitus noted.  Neurovascularly intact  distally.  Patient does have some lack of lordosis noted.  Tender to palpation diffusely in the paraspinal musculature but negative straight leg test.  Mild positive Corky Sox on the right side.  After informed written and verbal consent, patient was seated on exam table. Right knee was prepped with alcohol swab and utilizing anterolateral approach, patient's right knee space was injected with 4:1  marcaine 0.5%: Kenalog 40mg /dL. Patient tolerated the procedure well without immediate complications.  After informed written and verbal consent, patient was seated on exam table. Left knee was prepped with alcohol swab and utilizing anterolateral approach, patient's left knee space was injected with 4:1  marcaine 0.5%: Kenalog 40mg /dL. Patient tolerated the procedure well without immediate complications.    Impression and Recommendations:  This case required medical decision making of moderate complexity. The above documentation has been reviewed and is accurate and complete Lyndal Pulley, DO       Note: This dictation was prepared with Dragon dictation along with smaller phrase technology. Any transcriptional errors that result from this process are unintentional.

## 2019-08-05 NOTE — Assessment & Plan Note (Signed)
Stable at the moment.  Discussed icing regimen and home exercises, which activities to do which was to avoid.  Increase activity slowly over the course of several weeks.  Follow-up again in 4 to 8 weeks

## 2019-08-09 ENCOUNTER — Other Ambulatory Visit: Payer: Self-pay | Admitting: Internal Medicine

## 2019-08-16 ENCOUNTER — Other Ambulatory Visit: Payer: Self-pay | Admitting: Family Medicine

## 2019-08-27 ENCOUNTER — Other Ambulatory Visit: Payer: Self-pay | Admitting: Family Medicine

## 2019-09-17 ENCOUNTER — Other Ambulatory Visit: Payer: Self-pay | Admitting: Family Medicine

## 2019-09-22 ENCOUNTER — Ambulatory Visit: Payer: Managed Care, Other (non HMO) | Admitting: Internal Medicine

## 2019-09-23 ENCOUNTER — Ambulatory Visit (INDEPENDENT_AMBULATORY_CARE_PROVIDER_SITE_OTHER)
Admission: RE | Admit: 2019-09-23 | Discharge: 2019-09-23 | Disposition: A | Discharge status: Home / Self Care | Payer: Managed Care, Other (non HMO) | Source: Ambulatory Visit | Attending: Family Medicine | Admitting: Family Medicine

## 2019-09-23 ENCOUNTER — Other Ambulatory Visit: Payer: Self-pay

## 2019-09-23 ENCOUNTER — Ambulatory Visit: Payer: Managed Care, Other (non HMO) | Admitting: Family Medicine

## 2019-09-23 ENCOUNTER — Encounter: Payer: Self-pay | Admitting: Family Medicine

## 2019-09-23 VITALS — BP 132/78 | HR 66 | Ht 62.0 in | Wt 199.0 lb

## 2019-09-23 DIAGNOSIS — G8929 Other chronic pain: Secondary | ICD-10-CM

## 2019-09-23 DIAGNOSIS — M170 Bilateral primary osteoarthritis of knee: Secondary | ICD-10-CM

## 2019-09-23 DIAGNOSIS — M545 Low back pain, unspecified: Secondary | ICD-10-CM

## 2019-09-23 NOTE — Assessment & Plan Note (Signed)
Bilateral arthritis of the knees.  Patient is having worsening pain more on the right knee.  Discussed any instability.  Patient will be attempted to improve for the Visco supplementation.  Follow-up with me again when this is occurs.  Due to the overpronation of the hindfoot will put patient on oral orthotics as well in the near future to try to help alignment

## 2019-09-23 NOTE — Patient Instructions (Addendum)
Will call you for orthotics and the knee Continue the exercises Murelax 17 mg Colace 100 mg Will call you about orthotics

## 2019-09-23 NOTE — Assessment & Plan Note (Signed)
Continues to have low back pain.  X-rays were ordered today and independently visualized by me.  X-rays of lumbar spine do show that patient does have a moderate to severe degenerative disc disease

## 2019-09-23 NOTE — Progress Notes (Signed)
[facility] [address] [place], [place] [address] Phone: [phone] Subjective:   [name], am serving as a scribe for Dr. Hulan Saas.     CC: Knee pain and back pain follow-up  [id number]   08/05/2019 Stable at the moment.  Discussed icing regimen and home exercises, which activities to do which was to avoid.  Increase activity slowly over the course of several weeks.  Follow-up again in 4 to 8 weeks  Update 09/23/2019 Michalene Cusson Grella is a 63 y.o. female coming in with complaint of right knee pain. Left knee is doing well. Tried out an arch support and her right knee started to hurt. Pain over anterior and medial aspect.  Patient since then still has some mild discomfort from time to time.  Did not respond or had 100% better with the steroid injections previously.  Pain in back continues closer to the end of the day. Pain in lower back and glutes. Uses Tramadol prn.  Patient states that it can be fairly severe.  Patient denies though any significant pain going down the leg but sometimes it does feel heavy.     Past Medical History:  Diagnosis Date  . Asthma   . GERD (gastroesophageal reflux disease) 04/01/2019  . H. pylori infection   . History of UTI   . Hyperlipidemia   . Hypertension   . Hyperthyroidism   . Pes anserinus bursitis of both knees 07/29/2018  . Tubular adenoma of colon    Past Surgical History:  Procedure Laterality Date  . BREAST BIOPSY Right 08/15/2017   FIBROADENOMATOID CHANGE   . CESAREAN SECTION     Social History   Socioeconomic History  . Marital status: Single    Spouse name: Not on file  . Number of children: 1  . Years of education: Not on file  . Highest education level: Not on file  Occupational History  . Not on file  Social Needs  . Financial resource strain: Not on file  . Food insecurity    Worry: Not on file    Inability: Not on file  . Transportation needs    Medical: Not on file   Non-medical: Not on file  Tobacco Use  . Smoking status: Never Smoker  . Smokeless tobacco: Never Used  Substance and Sexual Activity  . Alcohol use: No    Alcohol/week: 0.0 standard drinks  . Drug use: No  . Sexual activity: Not on file  Lifestyle  . Physical activity    Days per week: Not on file    Minutes per session: Not on file  . Stress: Not on file  Relationships  . Social Herbalist on phone: Not on file    Gets together: Not on file    Attends religious service: Not on file    Active member of club or organization: Not on file    Attends meetings of clubs or organizations: Not on file    Relationship status: Not on file  Other Topics Concern  . Not on file  Social History Narrative  . Not on file   No Known Allergies Family History  Problem Relation Age of Onset  . Hypertension Mother   . Hypertension Father   . Diabetes Father   . Colon cancer Neg Hx   . Colon polyps Neg Hx   . Pancreatic cancer Neg Hx   . Rectal cancer Neg Hx   . Stomach cancer Neg  Hx   . Breast cancer Neg Hx      Current Outpatient Medications (Cardiovascular):  .  amLODipine (NORVASC) 10 MG tablet, TAKE ONE TABLET BY MOUTH DAILY .  carvedilol (COREG) 12.5 MG tablet, TAKE ONE TABLET BY MOUTH TWICE A DAY WITH MEALS .  hydrochlorothiazide (HYDRODIURIL) 25 MG tablet, TAKE ONE TABLET BY MOUTH DAILY .  rosuvastatin (CRESTOR) 10 MG tablet, TAKE ONE TABLET BY MOUTH DAILY   Current Outpatient Medications (Analgesics):  .  traMADol (ULTRAM) 50 MG tablet, Take 1 tablet (50 mg total) by mouth every 12 (twelve) hours as needed.   Current Outpatient Medications (Other):  Marland Kitchen  Diclofenac Sodium (PENNSAID) 2 % SOLN, Place 2 g onto the skin 2 (two) times daily. Marland Kitchen  dicyclomine (BENTYL) 10 MG capsule, TAKE ONE CAPSULE BY MOUTH THREE TIMES A DAY AS NEEDED FOR CRAMPS .  famotidine (PEPCID) 20 MG tablet, TAKE ONE TABLET BY MOUTH DAILY .  gabapentin (NEURONTIN) 300 MG capsule, TAKE ONE CAPSULE  BY MOUTH EVERY EVENING .  Probiotic Product (DIGESTIVE ADVANTAGE PO), Take by mouth daily. .  ranitidine (ZANTAC) 150 MG tablet, TAKE ONE TABLET BY MOUTH DAILY AS NEEDED FOR HEARTBURN    Past medical history, social, surgical and family history all reviewed in electronic medical record.  No pertanent information unless stated regarding to the chief complaint.   Review of Systems:  No headache, visual changes, nausea, vomiting, diarrhea, constipation, dizziness, abdominal pain, skin rash, fevers, chills, night sweats, weight loss, swollen lymph nodes, body aches, joint swelling, muscle aches, chest pain, shortness of breath, mood changes.   Objective  Blood pressure 132/78, pulse 66, height 5\' 2"  (1.575 m), weight 199 lb (90.3 kg), SpO2 99 %.   General: No apparent distress alert and oriented x3 mood and affect normal, dressed appropriately.  HEENT: Pupils equal, extraocular movements intact  Respiratory: Patient's speak in full sentences and does not appear short of breath  Cardiovascular: No lower extremity edema, non tender, no erythema  Skin: Warm dry intact with no signs of infection or rash on extremities or on axial skeleton.  Abdomen: Soft nontender  Neuro: Cranial nerves II through XII are intact, neurovascularly intact in all extremities with 2+ DTRs and 2+ pulses.  Lymph: No lymphadenopathy of posterior or anterior cervical chain or axillae bilaterally.  Gait normal with good balance and coordination.  MSK:  tender with full range of motion and good stability and symmetric strength and tone of shoulders, elbows, wrist, hip and ankles bilaterally.  Knee: Bilateral valgus deformity noted. Large thigh to calf ratio.  Tender to palpation over medial and PF joint line.  ROM full in flexion and extension and lower leg rotation. instability with valgus force.  painful patellar compression. Patellar glide with moderate crepitus. Patellar and quadriceps tendons unremarkable.  Hamstring and quadriceps strength is normal. Patient's ankles do show that patient has significant overpronation of the hindfoot.  Bilaterally.  Walks though in a supinated state  Back Exam has some loss of lordosis and poor core strength noted.  Patient does have some tender to palpation in the paraspinal musculature lumbar spine right greater than left.    Tightness with Corky Sox test   Impressiostraight leg test but no radic positive Faber bilaterally.  Ular symptoms.n and Recommendations:     This case required medical decision making of moderate complexity. The above documentation has been reviewed and is accurate and complete Lyndal Pulley, DO       Note: This dictation was prepared  with Dragon dictation along with smaller phrase technology. Any transcriptional errors that result from this process are unintentional.

## 2019-09-25 ENCOUNTER — Telehealth: Payer: Self-pay

## 2019-09-25 NOTE — Telephone Encounter (Signed)
Called patient to set up appointment for orthotics. Left message to call back.

## 2019-09-29 ENCOUNTER — Other Ambulatory Visit: Payer: Self-pay | Admitting: Family Medicine

## 2019-09-29 NOTE — Telephone Encounter (Signed)
Pt returned your phone call

## 2019-09-29 NOTE — Telephone Encounter (Signed)
Returned call. Patient scheduled for orthotics

## 2019-09-30 ENCOUNTER — Other Ambulatory Visit: Payer: Self-pay | Admitting: Family Medicine

## 2019-09-30 DIAGNOSIS — Z1231 Encounter for screening mammogram for malignant neoplasm of breast: Secondary | ICD-10-CM

## 2019-10-12 ENCOUNTER — Other Ambulatory Visit: Payer: Self-pay

## 2019-10-14 ENCOUNTER — Encounter: Payer: Self-pay | Admitting: Family Medicine

## 2019-10-14 ENCOUNTER — Encounter: Payer: Self-pay | Admitting: Internal Medicine

## 2019-10-14 ENCOUNTER — Other Ambulatory Visit: Payer: Self-pay

## 2019-10-14 ENCOUNTER — Ambulatory Visit: Payer: Managed Care, Other (non HMO) | Admitting: Family Medicine

## 2019-10-14 VITALS — BP 140/80 | HR 66 | Temp 97.0°F | Ht 63.5 in | Wt 196.8 lb

## 2019-10-14 DIAGNOSIS — M545 Low back pain, unspecified: Secondary | ICD-10-CM

## 2019-10-14 DIAGNOSIS — I10 Essential (primary) hypertension: Secondary | ICD-10-CM | POA: Diagnosis not present

## 2019-10-14 DIAGNOSIS — G8929 Other chronic pain: Secondary | ICD-10-CM

## 2019-10-14 DIAGNOSIS — D126 Benign neoplasm of colon, unspecified: Secondary | ICD-10-CM

## 2019-10-14 DIAGNOSIS — E785 Hyperlipidemia, unspecified: Secondary | ICD-10-CM | POA: Diagnosis not present

## 2019-10-14 MED ORDER — AMLODIPINE BESYLATE 10 MG PO TABS: 10.0000 mg | ORAL_TABLET | Freq: Every day | ORAL | 1 refills | Status: DC

## 2019-10-14 MED ORDER — HYDROCHLOROTHIAZIDE 25 MG PO TABS: 25.0000 mg | ORAL_TABLET | Freq: Every day | ORAL | 1 refills | Status: DC

## 2019-10-14 MED ORDER — CARVEDILOL 12.5 MG PO TABS: 12.5000 mg | ORAL_TABLET | Freq: Two times a day (BID) | ORAL | 1 refills | Status: DC

## 2019-10-14 MED ORDER — ROSUVASTATIN CALCIUM 10 MG PO TABS: 10.0000 mg | ORAL_TABLET | Freq: Every day | ORAL | 3 refills | Status: DC

## 2019-10-14 NOTE — Progress Notes (Signed)
Gail Rumps, MD Phone: [phone]  Gail Baker is a 63 y.o. female who presents today for f/u.  HYPERTENSION  Disease Monitoring  Home BP Monitoring [id number] systolic, notes today is higher related to stress from the people living in the apartment above her.  Chest pain- no    Dyspnea- no Medications  Compliance-  Taking amlodipine, coreg, HCTZ  Edema- no  HYPERLIPIDEMIA Symptoms Chest pain on exertion:  no  Medications: Compliance- taking crestor Right upper quadrant pain- no  Muscle aches- no  Low back Pain: chronic issue that is stable. Notes rare use of tramadol. No radiation, numbness, weakness, or incontinence issues. Continues to see Dr Tamala Julian.  Colon polyps: Patient is due for colonoscopy.    Social History   Tobacco Use  Smoking Status Never Smoker  Smokeless Tobacco Never Used     ROS see history of present illness  Objective  Physical Exam Vitals:   10/14/19 0809 10/14/19 0825  BP: 140/90 140/80  Pulse: 66   Temp: (!) 97 F (36.1 C)   SpO2: 99%     BP Readings from Last 3 Encounters:  10/14/19 140/80  09/23/19 132/78  08/05/19 118/62   Wt Readings from Last 3 Encounters:  10/14/19 196 lb 12.8 oz (89.3 kg)  09/23/19 199 lb (90.3 kg)  08/05/19 198 lb (89.8 kg)    Physical Exam Constitutional:      General: She is not in acute distress.    Appearance: She is not diaphoretic.  Cardiovascular:     Rate and Rhythm: Normal rate and regular rhythm.     Heart sounds: Normal heart sounds.  Pulmonary:     Effort: Pulmonary effort is normal.     Breath sounds: Normal breath sounds.  Musculoskeletal:     Right lower leg: No edema.     Left lower leg: No edema.     Comments: No midline spine tenderness, no midline spine step-off, no muscular back tenderness  Skin:    General: Skin is warm and dry.  Neurological:     Mental Status: She is alert.     Comments: 5/5 strength bilateral quads, hamstrings, plantar flexion, and dorsiflexion,  sensation to light touch intact bilateral lower extremities      Assessment/Plan: Please see individual problem list.  Hypertension Blood pressure is borderline elevated today though she reports it is running normal at home.  I suspect slightly elevated today due to lack of sleep and stress from her upstairs neighbors making noise.  She will continue with her current regimen.  She will let us know if it trends up.  Tubular adenoma of colon Refer to GI for colonoscopy.  She will be due in November.  Chronic low back pain Chronic and stable.  She will continue to follow with Dr. Tamala Julian.  Continue as needed tramadol.  Hyperlipidemia Previously well controlled.  Tolerating Crestor.  We will plan for a lipid panel at her next visit.   Health Maintenance: Discussed current Pap smear guidelines being every 5 years for a Pap smear with negative abnormal cells and negative HPV.  Discussed that I do typically do a periodic pelvic exam in between Pap smears for evaluation.  Discussed completing a physical with her next visit and a pelvic exam at that time.  Orders Placed This Encounter  Procedures  . Ambulatory referral to Gastroenterology    Referral Priority:   Routine    Referral Type:   Consultation    Referral Reason:   Specialty Services  Required    Number of Visits Requested:   1    Meds ordered this encounter  Medications  . hydrochlorothiazide (HYDRODIURIL) 25 MG tablet    Sig: Take 1 tablet (25 mg total) by mouth daily.    Dispense:  90 tablet    Refill:  1  . carvedilol (COREG) 12.5 MG tablet    Sig: Take 1 tablet (12.5 mg total) by mouth 2 (two) times daily with a meal.    Dispense:  180 tablet    Refill:  1  . amLODipine (NORVASC) 10 MG tablet    Sig: Take 1 tablet (10 mg total) by mouth daily.    Dispense:  90 tablet    Refill:  1  . rosuvastatin (CRESTOR) 10 MG tablet    Sig: Take 1 tablet (10 mg total) by mouth daily.    Dispense:  90 tablet    Refill:  [place], MD [place]

## 2019-10-14 NOTE — Assessment & Plan Note (Signed)
Blood pressure is borderline elevated today though she reports it is running normal at home.  I suspect slightly elevated today due to lack of sleep and stress from her upstairs neighbors making noise.  She will continue with her current regimen.  She will let us know if it trends up.

## 2019-10-14 NOTE — Assessment & Plan Note (Signed)
Refer to GI for colonoscopy.  She will be due in November.

## 2019-10-14 NOTE — Assessment & Plan Note (Signed)
Chronic and stable.  She will continue to follow with Dr. Tamala Julian.  Continue as needed tramadol.

## 2019-10-14 NOTE — Patient Instructions (Signed)
Nice to see you. We will get you referred to see Dr. Hilarie Fredrickson for a colonoscopy. Please continue to monitor your blood pressure at home.  If it trends up please let us know.

## 2019-10-14 NOTE — Assessment & Plan Note (Signed)
Previously well controlled.  Tolerating Crestor.  We will plan for a lipid panel at her next visit.

## 2019-10-26 ENCOUNTER — Ambulatory Visit (INDEPENDENT_AMBULATORY_CARE_PROVIDER_SITE_OTHER): Payer: Managed Care, Other (non HMO) | Admitting: Family Medicine

## 2019-10-26 ENCOUNTER — Encounter: Payer: Self-pay | Admitting: Family Medicine

## 2019-10-26 ENCOUNTER — Other Ambulatory Visit: Payer: Self-pay

## 2019-10-26 DIAGNOSIS — M170 Bilateral primary osteoarthritis of knee: Secondary | ICD-10-CM

## 2019-10-26 DIAGNOSIS — M2142 Flat foot [pes planus] (acquired), left foot: Secondary | ICD-10-CM | POA: Diagnosis not present

## 2019-10-26 DIAGNOSIS — M2141 Flat foot [pes planus] (acquired), right foot: Secondary | ICD-10-CM | POA: Diagnosis not present

## 2019-10-26 NOTE — Progress Notes (Signed)
  Procedure Note   Patient was fitted for a : standard, cushioned, semi-rigid orthotic. The orthotic was heated and afterward the patient patient seated position and molded The patient was positioned in subtalar neutral position and 10 degrees of ankle dorsiflexion in a weight bearing stance. After completion of molding, patient did have orthotic management The blank was ground to a stable position for weight bearing. Size: 11 Base: Carbon fiber Additional Posting and Padding: left and right 300/120 300/110 right transverse 200/40 The patient ambulated these, and they were very comfortable.

## 2019-10-26 NOTE — Patient Instructions (Addendum)
See me again in 6-8 weeks ?

## 2019-10-26 NOTE — Assessment & Plan Note (Signed)
Injection given today.  Tolerated the procedure well, discussed icing regimen and home exercises, which activities to do which wants to avoid.  Patient will increase activity as tolerated.  Follow-up again in 4 to 8 weeks

## 2019-10-26 NOTE — Assessment & Plan Note (Signed)
Patient should do relatively well.  We discussed that we can increasing wear over the course the next 2 weeks.  Discussed icing regimen, home exercise, which activities to do which wants to avoid.  Patient to increase activity as tolerated.

## 2019-10-26 NOTE — Progress Notes (Signed)
[name] [address] [place], [place] [address] Phone: [phone] Subjective:    I'm seeing this patient by the request  of:    CC: Bilateral knee pain  [id number]  Gail Baker is a 63 y.o. female coming in with complaint of bilateral knee pain.  Has been fairly severe overall.  Patient does have known arthritic changes.  Patient has been cleared for viscosupplementation.     Past Medical History:  Diagnosis Date  . Asthma   . GERD (gastroesophageal reflux disease) 04/01/2019  . H. pylori infection   . History of UTI   . Hyperlipidemia   . Hypertension   . Hyperthyroidism   . Pes anserinus bursitis of both knees 07/29/2018  . Tubular adenoma of colon    Past Surgical History:  Procedure Laterality Date  . BREAST BIOPSY Right 08/15/2017   FIBROADENOMATOID CHANGE   . CESAREAN SECTION     Social History   Socioeconomic History  . Marital status: Single    Spouse name: Not on file  . Number of children: 1  . Years of education: Not on file  . Highest education level: Not on file  Occupational History  . Not on file  Social Needs  . Financial resource strain: Not on file  . Food insecurity    Worry: Not on file    Inability: Not on file  . Transportation needs    Medical: Not on file    Non-medical: Not on file  Tobacco Use  . Smoking status: Never Smoker  . Smokeless tobacco: Never Used  Substance and Sexual Activity  . Alcohol use: No    Alcohol/week: 0.0 standard drinks  . Drug use: No  . Sexual activity: Not on file  Lifestyle  . Physical activity    Days per week: Not on file    Minutes per session: Not on file  . Stress: Not on file  Relationships  . Social Herbalist on phone: Not on file    Gets together: Not on file    Attends religious service: Not on file    Active member of club or organization: Not on file    Attends meetings of clubs or organizations: Not on file    Relationship status:  Not on file  Other Topics Concern  . Not on file  Social History Narrative  . Not on file   No Known Allergies Family History  Problem Relation Age of Onset  . Hypertension Mother   . Hypertension Father   . Diabetes Father   . Colon cancer Neg Hx   . Colon polyps Neg Hx   . Pancreatic cancer Neg Hx   . Rectal cancer Neg Hx   . Stomach cancer Neg Hx   . Breast cancer Neg Hx      Current Outpatient Medications (Cardiovascular):  .  amLODipine (NORVASC) 10 MG tablet, Take 1 tablet (10 mg total) by mouth daily. .  carvedilol (COREG) 12.5 MG tablet, Take 1 tablet (12.5 mg total) by mouth 2 (two) times daily with a meal. .  hydrochlorothiazide (HYDRODIURIL) 25 MG tablet, Take 1 tablet (25 mg total) by mouth daily. .  rosuvastatin (CRESTOR) 10 MG tablet, Take 1 tablet (10 mg total) by mouth daily.   Current Outpatient Medications (Analgesics):  .  traMADol (ULTRAM) 50 MG tablet, Take 1 tablet (50 mg total) by mouth every 12 (twelve) hours as needed.   Current Outpatient Medications (Other):  .  Diclofenac Sodium (PENNSAID) 2 % SOLN, Place 2 g onto the skin 2 (two) times daily. Marland Kitchen  dicyclomine (BENTYL) 10 MG capsule, TAKE ONE CAPSULE BY MOUTH THREE TIMES A DAY AS NEEDED FOR CRAMPS .  famotidine (PEPCID) 20 MG tablet, TAKE ONE TABLET BY MOUTH DAILY .  gabapentin (NEURONTIN) 300 MG capsule, TAKE ONE CAPSULE BY MOUTH EVERY EVENING .  Probiotic Product (DIGESTIVE ADVANTAGE PO), Take by mouth daily. .  ranitidine (ZANTAC) 150 MG tablet, TAKE ONE TABLET BY MOUTH DAILY AS NEEDED FOR HEARTBURN    Past medical history, social, surgical and family history all reviewed in electronic medical record.  No pertanent information unless stated regarding to the chief complaint.   Review of Systems:  No headache, visual changes, nausea, vomiting, diarrhea, constipation, dizziness, abdominal pain, skin rash, fevers, chills, night sweats, weight loss, swollen lymph nodes, body aches, joint swelling,  muscle aches, chest pain, shortness of breath, mood changes.   Objective  Height 5' 3.5" (1.613 m).    General: No apparent distress alert and oriented x3 mood and affect normal, dressed appropriately.  HEENT: Pupils equal, extraocular movements intact  Respiratory: Patient's speak in full sentences and does not appear short of breath  Cardiovascular: No lower extremity edema, non tender, no erythema  Skin: Warm dry intact with no signs of infection or rash on extremities or on axial skeleton.  Abdomen: Soft nontender  Neuro: Cranial nerves II through XII are intact, neurovascularly intact in all extremities with 2+ DTRs and 2+ pulses.  Lymph: No lymphadenopathy of posterior or anterior cervical chain or axillae bilaterally.  Gait antalgic MSK:  Non tender with full range of motion and good stability and symmetric strength and tone of shoulders, elbows, wrist, hip, and ankles bilaterally.  Knee: Bilateral valgus deformity noted. Large thigh to calf ratio.  Tender to palpation over medial and PF joint line.  ROM full in flexion and extension and lower leg rotation. instability with valgus force.  painful patellar compression. Patellar glide with moderate crepitus. Patellar and quadriceps tendons unremarkable. Hamstring and quadriceps strength is normal.  After informed written and verbal consent, patient was seated on exam table. Right knee was prepped with alcohol swab and utilizing anterolateral approach, patient's right knee space was injected with Monovisc 22 mg/mL. Patient tolerated the procedure well without immediate complications.  After informed written and verbal consent, patient was seated on exam table. Left knee was prepped with alcohol swab and utilizing anterolateral approach, patient's left knee space was injected with 22 mg/mL of Monovisc (sodium hyaluronate) in a prefilled syringe was injected easily into the knee through a 22-gauge needle..Patient tolerated the procedure  well without immediate complications.   Impression and Recommendations:     This case required medical decision making of moderate complexity. The above documentation has been reviewed and is accurate and complete Lyndal Pulley, DO       Note: This dictation was prepared with Dragon dictation along with smaller phrase technology. Any transcriptional errors that result from this process are unintentional.

## 2019-11-06 ENCOUNTER — Ambulatory Visit
Admission: RE | Admit: 2019-11-06 | Discharge: 2019-11-06 | Disposition: A | Discharge status: Home / Self Care | Payer: Managed Care, Other (non HMO) | Source: Ambulatory Visit | Attending: Family Medicine | Admitting: Family Medicine

## 2019-11-06 DIAGNOSIS — Z1231 Encounter for screening mammogram for malignant neoplasm of breast: Secondary | ICD-10-CM | POA: Diagnosis present

## 2019-11-07 ENCOUNTER — Other Ambulatory Visit: Payer: Self-pay | Admitting: Family Medicine

## 2019-11-25 ENCOUNTER — Other Ambulatory Visit: Payer: Self-pay | Admitting: Family Medicine

## 2019-11-25 NOTE — Telephone Encounter (Signed)
Requested medication (s) are due for refill today: yes  Requested medication (s) are on the active medication list: yes  Last refill:  04/01/2019  Future visit scheduled: yes  Notes to clinic:  Refill cannot be delegated    Requested Prescriptions  Pending Prescriptions Disp Refills   traMADol (ULTRAM) 50 MG tablet 30 tablet 0    Sig: Take 1 tablet (50 mg total) by mouth every 12 (twelve) hours as needed.     Not Delegated - Analgesics:  Opioid Agonists Failed - 11/25/2019  1:26 PM      Failed - This refill cannot be delegated      Failed - Urine Drug Screen completed in last 360 days.      Passed - Valid encounter within last 6 months    Recent Outpatient Visits          1 month ago Primary osteoarthritis of both [place], [place], DO   1 month ago Pes planus of both feet   [place], [place], DO   1 month ago Essential hypertension   [place], Angela Adam, MD   2 months ago Low back pain, unspecified back pain laterality, unspecified chronicity, unspecified whether sciatica present   [place], [place], DO   3 months ago Primary osteoarthritis of both [place], [place], DO      Future Appointments            In 2 weeks Lyndal Pulley, [place], [place]   In 4 months Caryl Bis, Angela Adam, MD [facility]

## 2019-11-25 NOTE — Telephone Encounter (Signed)
Medication Refill - Medication: traMADol (ULTRAM) 50 MG tablet [id number]    Preferred Pharmacy (with phone number or street name):  [facility]  [place] [address]  Phone: [phone] Fax: [phone]     Agent: Please be advised that RX refills may take up to 3 business days. We ask that you follow-up with your pharmacy.

## 2019-11-30 ENCOUNTER — Encounter: Payer: Managed Care, Other (non HMO) | Admitting: Internal Medicine

## 2019-12-04 MED ORDER — TRAMADOL HCL 50 MG PO TABS: 50.0000 mg | ORAL_TABLET | Freq: Two times a day (BID) | ORAL | 0 refills | Status: DC | PRN

## 2019-12-09 ENCOUNTER — Encounter: Payer: Self-pay | Admitting: Family Medicine

## 2019-12-09 ENCOUNTER — Other Ambulatory Visit (INDEPENDENT_AMBULATORY_CARE_PROVIDER_SITE_OTHER): Payer: Managed Care, Other (non HMO)

## 2019-12-09 ENCOUNTER — Ambulatory Visit (INDEPENDENT_AMBULATORY_CARE_PROVIDER_SITE_OTHER): Payer: Managed Care, Other (non HMO) | Admitting: Family Medicine

## 2019-12-09 ENCOUNTER — Other Ambulatory Visit: Payer: Self-pay

## 2019-12-09 VITALS — BP 122/62 | HR 44 | Ht 63.5 in | Wt 196.0 lb

## 2019-12-09 DIAGNOSIS — M2550 Pain in unspecified joint: Secondary | ICD-10-CM

## 2019-12-09 DIAGNOSIS — G8929 Other chronic pain: Secondary | ICD-10-CM

## 2019-12-09 DIAGNOSIS — M545 Low back pain, unspecified: Secondary | ICD-10-CM

## 2019-12-09 DIAGNOSIS — M170 Bilateral primary osteoarthritis of knee: Secondary | ICD-10-CM | POA: Diagnosis not present

## 2019-12-09 LAB — COMPREHENSIVE METABOLIC PANEL
ALT: 12 U/L (ref 0–35)
AST: 18 U/L (ref 0–37)
Albumin: 4.3 g/dL (ref 3.5–5.2)
Alkaline Phosphatase: 56 U/L (ref 39–117)
BUN: 11 mg/dL (ref 6–23)
CO2: 30 mEq/L (ref 19–32)
Calcium: 9.5 mg/dL (ref 8.4–10.5)
Chloride: 104 mEq/L (ref 96–112)
Creatinine, Ser: 0.67 mg/dL (ref 0.40–1.20)
GFR: 106.93 mL/min (ref >60.00)
Glucose, Bld: 93 mg/dL (ref 70–99)
Potassium: 3.7 mEq/L (ref 3.5–5.1)
Sodium: 140 mEq/L (ref 135–145)
Total Bilirubin: 0.6 mg/dL (ref 0.2–1.2)
Total Protein: 8 g/dL (ref 6.0–8.3)

## 2019-12-09 LAB — URIC ACID: Uric Acid, Serum: 4.2 mg/dL (ref 2.4–7.0)

## 2019-12-09 LAB — CBC WITH DIFFERENTIAL/PLATELET
Basophils Absolute: 0 10*3/uL (ref 0.0–0.1)
Basophils Relative: 0.6 % (ref 0.0–3.0)
Eosinophils Absolute: 0.2 10*3/uL (ref 0.0–0.7)
Eosinophils Relative: 7 % — ABNORMAL HIGH (ref 0.0–5.0)
HCT: 36.6 % (ref 36.0–46.0)
Hemoglobin: 12.6 g/dL (ref 12.0–15.0)
Lymphocytes Relative: 32.9 % (ref 12.0–46.0)
Lymphs Abs: 0.8 10*3/uL (ref 0.7–4.0)
MCHC: 34.5 g/dL (ref 30.0–36.0)
MCV: 86.4 fl (ref 78.0–100.0)
Monocytes Absolute: 0.3 10*3/uL (ref 0.1–1.0)
Monocytes Relative: 11.8 % (ref 3.0–12.0)
Neutro Abs: 1.2 10*3/uL — ABNORMAL LOW (ref 1.4–7.7)
Neutrophils Relative %: 47.7 % (ref 43.0–77.0)
Platelets: 234 10*3/uL (ref 150.0–400.0)
RBC: 4.24 Mil/uL (ref 3.87–5.11)
RDW: 13.3 % (ref 11.5–15.5)
WBC: 2.5 10*3/uL — ABNORMAL LOW (ref 4.0–10.5)

## 2019-12-09 LAB — FERRITIN: Ferritin: 236.9 ng/mL (ref 10.0–291.0)

## 2019-12-09 LAB — C-REACTIVE PROTEIN: CRP: <1 mg/dL (ref 0.5–20.0)

## 2019-12-09 LAB — IBC PANEL
Iron: 122 ug/dL (ref 42–145)
Saturation Ratios: 41.5 % (ref 20.0–50.0)
Transferrin: 210 mg/dL — ABNORMAL LOW (ref 212.0–360.0)

## 2019-12-09 LAB — TESTOSTERONE: Testosterone: 27.93 ng/dL (ref 15.00–40.00)

## 2019-12-09 LAB — VITAMIN D 25 HYDROXY (VIT D DEFICIENCY, FRACTURES): VITD: 54.67 ng/mL (ref 30.00–100.00)

## 2019-12-09 LAB — SEDIMENTATION RATE: Sed Rate: 28 mm/hr (ref 0–30)

## 2019-12-09 LAB — TSH: TSH: 0.48 u[IU]/mL (ref 0.35–4.50)

## 2019-12-09 MED ORDER — GABAPENTIN 100 MG PO CAPS: 200.0000 mg | ORAL_CAPSULE | Freq: Every day | ORAL | 0 refills | Status: DC

## 2019-12-09 MED ORDER — GABAPENTIN 300 MG PO CAPS: 300.0000 mg | ORAL_CAPSULE | Freq: Every evening | ORAL | 0 refills | Status: DC

## 2019-12-09 NOTE — Patient Instructions (Addendum)
  [address]st [address] [place], [place] [address] Phone [phone]  Gabapentin 100mg  twice daily one in morning, once in afternoon, 300mg  at night Labs today Send message in 2-3 weeks if not better will consider MRI Otherwise will see you in 5-6 weeks

## 2019-12-09 NOTE — Assessment & Plan Note (Signed)
Patient has chronic low back pain with facet arthropathy.  Concerning for the amount of arthritis secondary to patient's age.  Advanced imaging may be warranted single person will get laboratory work-up and increase patient's gabapentin and see if this will be beneficial.  Discussed continuing home exercises, core strengthening stability, follow-up again in 6 to 8 weeks otherwise.

## 2019-12-09 NOTE — Progress Notes (Signed)
[facility] [place], [place] [address] Phone: [phone] Subjective:   [name], am serving as a scribe for Dr. Hulan Saas. This visit occurred during the SARS-CoV-2 public health emergency.  Safety protocols were in place, including screening questions prior to the visit, additional usage of staff PPE, and extensive cleaning of exam room while observing appropriate contact time as indicated for disinfecting solutions.     CC: Low back pain, knee pain follow-up  [id number]   10/26/2019 Injection given today.  Tolerated the procedure well, discussed icing regimen and home exercises, which activities to do which wants to avoid.  Patient will increase activity as tolerated.  Follow-up again in 4 to 8 weeks  Update 12/09/2019 Gail [facility] is a 63 y.o. female coming in with complaint of right knee pain. She did have relief from injection given last visit. Pain worse in the morning over patellar tendon but is less after the day progresses.   Also having pain in her lower back. Pain is same as last visit. Pain worse on left side and can radiate into the left glute. Sometimes has pain in the quad and calf on that side. Using gabapentin daily, Tramdol or Tylenol prn.  Patient states that the gabapentin does help her sleep at night but does not help her with any of the pain during the day.     Past Medical History:  Diagnosis Date  . Asthma   . GERD (gastroesophageal reflux disease) 04/01/2019  . H. pylori infection   . History of UTI   . Hyperlipidemia   . Hypertension   . Hyperthyroidism   . Pes anserinus bursitis of both knees 07/29/2018  . Tubular adenoma of colon    Past Surgical History:  Procedure Laterality Date  . BREAST BIOPSY Right 08/15/2017   FIBROADENOMATOID CHANGE   . CESAREAN SECTION     Social History   Socioeconomic History  . Marital status: Single    Spouse name: Not on file  . Number of children: 1  .  Years of education: Not on file  . Highest education level: Not on file  Occupational History  . Not on file  Social Needs  . Financial resource strain: Not on file  . Food insecurity    Worry: Not on file    Inability: Not on file  . Transportation needs    Medical: Not on file    Non-medical: Not on file  Tobacco Use  . Smoking status: Never Smoker  . Smokeless tobacco: Never Used  Substance and Sexual Activity  . Alcohol use: No    Alcohol/week: 0.0 standard drinks  . Drug use: No  . Sexual activity: Not on file  Lifestyle  . Physical activity    Days per week: Not on file    Minutes per session: Not on file  . Stress: Not on file  Relationships  . Social Herbalist on phone: Not on file    Gets together: Not on file    Attends religious service: Not on file    Active member of club or organization: Not on file    Attends meetings of clubs or organizations: Not on file    Relationship status: Not on file  Other Topics Concern  . Not on file  Social History Narrative  . Not on file   No Known Allergies Family History  Problem Relation Age of Onset  . Hypertension Mother   .  Hypertension Father   . Diabetes Father   . Colon cancer Neg Hx   . Colon polyps Neg Hx   . Pancreatic cancer Neg Hx   . Rectal cancer Neg Hx   . Stomach cancer Neg Hx   . Breast cancer Neg Hx      Current Outpatient Medications (Cardiovascular):  .  amLODipine (NORVASC) 10 MG tablet, Take 1 tablet (10 mg total) by mouth daily. .  carvedilol (COREG) 12.5 MG tablet, Take 1 tablet (12.5 mg total) by mouth 2 (two) times daily with a meal. .  hydrochlorothiazide (HYDRODIURIL) 25 MG tablet, Take 1 tablet (25 mg total) by mouth daily. .  rosuvastatin (CRESTOR) 10 MG tablet, Take 1 tablet (10 mg total) by mouth daily.   Current Outpatient Medications (Analgesics):  .  traMADol (ULTRAM) 50 MG tablet, Take 1 tablet (50 mg total) by mouth every 12 (twelve) hours as needed.    Current Outpatient Medications (Other):  Marland Kitchen  Diclofenac Sodium (PENNSAID) 2 % SOLN, Place 2 g onto the skin 2 (two) times daily. Marland Kitchen  dicyclomine (BENTYL) 10 MG capsule, TAKE ONE CAPSULE BY MOUTH THREE TIMES A DAY AS NEEDED FOR CRAMPS .  famotidine (PEPCID) 20 MG tablet, TAKE ONE TABLET BY MOUTH DAILY .  Probiotic Product (DIGESTIVE ADVANTAGE PO), Take by mouth daily. .  ranitidine (ZANTAC) 150 MG tablet, TAKE ONE TABLET BY MOUTH DAILY AS NEEDED FOR HEARTBURN .  gabapentin (NEURONTIN) 100 MG capsule, Take 2 capsules (200 mg total) by mouth at bedtime. .  gabapentin (NEURONTIN) 300 MG capsule, Take 1 capsule (300 mg total) by mouth every evening.    Past medical history, social, surgical and family history all reviewed in electronic medical record.  No pertanent information unless stated regarding to the chief complaint.   Review of Systems:  No headache, visual changes, nausea, vomiting, diarrhea, constipation, dizziness, abdominal pain, skin rash, fevers, chills, night sweats, weight loss, swollen lymph nodes, body aches, joint swelling,  chest pain, shortness of breath, mood changes.  Positive muscle aches  Objective  Blood pressure 122/62, pulse (!) 44, height 5' 3.5" (1.613 m), weight 196 lb (88.9 kg), SpO2 99 %.    General: No apparent distress alert and oriented x3 mood and affect normal, dressed appropriately.  HEENT: Pupils equal, extraocular movements intact  Respiratory: Patient's speak in full sentences and does not appear short of breath  Cardiovascular: No lower extremity edema, non tender, no erythema  Skin: Warm dry intact with no signs of infection or rash on extremities or on axial skeleton.  Abdomen: Soft nontender  Neuro: Cranial nerves II through XII are intact, neurovascularly intact in all extremities with 2+ DTRs and 2+ pulses.  Lymph: No lymphadenopathy of posterior or anterior cervical chain or axillae bilaterally.  Gait antalgic MSK:  tender with limited range of  motion and good stability and symmetric strength and tone of shoulders, elbows, wrist, hip, and ankles bilaterally.   Knee: Bilateral valgus deformity noted.  Abnormal thigh to calf ratio.  Tender to palpation over medial and PF joint line.  ROM full in flexion and extension and lower leg rotation. instability with valgus force.  painful patellar compression. Patellar glide with moderate crepitus. Patellar and quadriceps tendons unremarkable. Hamstring and quadriceps strength is normal.   Back exam does have some soft lordosis and some degenerative scoliosis.  Patient does have tightness with straight leg test but no radicular symptoms.  Worsening pain with extension of the back and only 10 degrees of  extension noted.  Mild tightness with Corky Sox test bilaterally.  5 out of 5 strength of the lower extremities though still noted.  Deep tendon reflexes are intact.    Impression and Recommendations:     This case required medical decision making of moderate complexity. The above documentation has been reviewed and is accurate and complete Lyndal Pulley, DO       Note: This dictation was prepared with Dragon dictation along with smaller phrase technology. Any transcriptional errors that result from this process are unintentional.

## 2019-12-09 NOTE — Assessment & Plan Note (Signed)
Severe arthritic changes.  Discussed which activities to do which wants to avoid.  Patient can increase activity slowly over the course the next several weeks again.  Patient seems to be doing relatively well with the viscosupplementation.  We will do steroid injections if necessary again in 6 to 8 weeks

## 2019-12-11 LAB — ANGIOTENSIN CONVERTING ENZYME: Angiotensin-Converting Enzyme: 60 U/L (ref 9–67)

## 2019-12-11 LAB — ANTI-NUCLEAR AB-TITER (ANA TITER)
ANA TITER: 1:40 {titer} — ABNORMAL HIGH
ANA Titer 1: 1:40 {titer} — ABNORMAL HIGH

## 2019-12-11 LAB — CYCLIC CITRUL PEPTIDE ANTIBODY, IGG: Cyclic Citrullin Peptide Ab: <16 UNITS

## 2019-12-11 LAB — CALCIUM, IONIZED: Calcium, Ion: 5.01 mg/dL (ref 4.8–5.6)

## 2019-12-11 LAB — ANA: Anti Nuclear Antibody (ANA): POSITIVE — AB

## 2019-12-11 LAB — PTH, INTACT AND CALCIUM
Calcium: 9.9 mg/dL (ref 8.6–10.4)
PTH: 28 pg/mL (ref 14–64)

## 2019-12-11 LAB — RHEUMATOID FACTOR: Rheumatoid fact SerPl-aCnc: <14 IU/mL (ref <14)

## 2019-12-17 ENCOUNTER — Encounter: Payer: Self-pay | Admitting: Internal Medicine

## 2019-12-28 ENCOUNTER — Encounter: Payer: Self-pay | Admitting: Family Medicine

## 2020-01-13 ENCOUNTER — Ambulatory Visit (INDEPENDENT_AMBULATORY_CARE_PROVIDER_SITE_OTHER): Payer: Managed Care, Other (non HMO) | Admitting: Family Medicine

## 2020-01-13 ENCOUNTER — Other Ambulatory Visit: Payer: Self-pay

## 2020-01-13 ENCOUNTER — Encounter: Payer: Self-pay | Admitting: Family Medicine

## 2020-01-13 DIAGNOSIS — M170 Bilateral primary osteoarthritis of knee: Secondary | ICD-10-CM

## 2020-01-13 NOTE — Assessment & Plan Note (Addendum)
Stable at the moment.  We will continue with the conservative therapy at the moment.  Can repeat injections but seems that viscosupplementation has done relatively well.  Follow-up with me again 6 to 8 weeks spent  25 minutes with patient face-to-face and had greater than 50% of counseling including as described above in assessment and plan.

## 2020-01-13 NOTE — Progress Notes (Signed)
[place] [address] [place] Phone: [phone] Subjective:   I Gail Baker am serving as a Education administrator for Dr. Hulan Saas.  This visit occurred during the SARS-CoV-2 public health emergency.  Safety protocols were in place, including screening questions prior to the visit, additional usage of staff PPE, and extensive cleaning of exam room while observing appropriate contact time as indicated for disinfecting solutions.      CC: Low back pain and knee pain follow-up  [id number]   12/09/2019 Severe arthritic changes.  Discussed which activities to do which wants to avoid.  Patient can increase activity slowly over the course the next several weeks again.  Patient seems to be doing relatively well with the viscosupplementation.  We will do steroid injections if necessary again in 6 to 8 weeks  Patient has chronic low back pain with facet arthropathy.  Concerning for the amount of arthritis secondary to patient's age.  Advanced imaging may be warranted single person will get laboratory work-up and increase patient's gabapentin and see if this will be beneficial.  Discussed continuing home exercises, core strengthening stability, follow-up again in 6 to 8 weeks otherwise.  Gail Baker is a 64 y.o. female coming in with complaint of bilateral knee and back pain. Patient states she is improving.   Laboratory work-up patient had did have a positive ANA (very low titer.  Otherwise fairly unremarkable    Past Medical History:  Diagnosis Date  . Asthma   . GERD (gastroesophageal reflux disease) 04/01/2019  . H. pylori infection   . History of UTI   . Hyperlipidemia   . Hypertension   . Hyperthyroidism   . Pes anserinus bursitis of both knees 07/29/2018  . Tubular adenoma of colon    Past Surgical History:  Procedure Laterality Date  . BREAST BIOPSY Right 08/15/2017   FIBROADENOMATOID CHANGE   . CESAREAN SECTION     Social  History   Socioeconomic History  . Marital status: Single    Spouse name: Not on file  . Number of children: 1  . Years of education: Not on file  . Highest education level: Not on file  Occupational History  . Not on file  Tobacco Use  . Smoking status: Never Smoker  . Smokeless tobacco: Never Used  Substance and Sexual Activity  . Alcohol use: No    Alcohol/week: 0.0 standard drinks  . Drug use: No  . Sexual activity: Not on file  Other Topics Concern  . Not on file  Social History Narrative  . Not on file   Social Determinants of Health   Financial Resource Strain:   . Difficulty of Paying Living Expenses: Not on file  Food Insecurity:   . Worried About Charity fundraiser in the Last Year: Not on file  . Ran Out of Food in the Last Year: Not on file  Transportation Needs:   . Lack of Transportation (Medical): Not on file  . Lack of Transportation (Non-Medical): Not on file  Physical Activity:   . Days of Exercise per Week: Not on file  . Minutes of Exercise per Session: Not on file  Stress:   . Feeling of Stress : Not on file  Social Connections:   . Frequency of Communication with Friends and Family: Not on file  . Frequency of Social Gatherings with Friends and Family: Not on file  . Attends Religious Services: Not on file  . Active Member of  Clubs or Organizations: Not on file  . Attends Archivist Meetings: Not on file  . Marital Status: Not on file   No Known Allergies Family History  Problem Relation Age of Onset  . Hypertension Mother   . Hypertension Father   . Diabetes Father   . Colon cancer Neg Hx   . Colon polyps Neg Hx   . Pancreatic cancer Neg Hx   . Rectal cancer Neg Hx   . Stomach cancer Neg Hx   . Breast cancer Neg Hx      Current Outpatient Medications (Cardiovascular):  .  amLODipine (NORVASC) 10 MG tablet, Take 1 tablet (10 mg total) by mouth daily. .  carvedilol (COREG) 12.5 MG tablet, Take 1 tablet (12.5 mg total) by  mouth 2 (two) times daily with a meal. .  hydrochlorothiazide (HYDRODIURIL) 25 MG tablet, Take 1 tablet (25 mg total) by mouth daily. .  rosuvastatin (CRESTOR) 10 MG tablet, Take 1 tablet (10 mg total) by mouth daily.   Current Outpatient Medications (Analgesics):  .  traMADol (ULTRAM) 50 MG tablet, Take 1 tablet (50 mg total) by mouth every 12 (twelve) hours as needed.   Current Outpatient Medications (Other):  Marland Kitchen  Diclofenac Sodium (PENNSAID) 2 % SOLN, Place 2 g onto the skin 2 (two) times daily. Marland Kitchen  dicyclomine (BENTYL) 10 MG capsule, TAKE ONE CAPSULE BY MOUTH THREE TIMES A DAY AS NEEDED FOR CRAMPS .  famotidine (PEPCID) 20 MG tablet, TAKE ONE TABLET BY MOUTH DAILY .  gabapentin (NEURONTIN) 100 MG capsule, Take 2 capsules (200 mg total) by mouth at bedtime. .  gabapentin (NEURONTIN) 300 MG capsule, Take 1 capsule (300 mg total) by mouth every evening. .  Probiotic Product (DIGESTIVE ADVANTAGE PO), Take by mouth daily. .  ranitidine (ZANTAC) 150 MG tablet, TAKE ONE TABLET BY MOUTH DAILY AS NEEDED FOR HEARTBURN    Past medical history, social, surgical and family history all reviewed in electronic medical record.  No pertanent information unless stated regarding to the chief complaint.   Review of Systems:  No headache, visual changes, nausea, vomiting, diarrhea, constipation, dizziness, abdominal pain, skin rash, fevers, chills, night sweats, weight loss, swollen lymph nodes, body aches, joint swelling, muscle aches, chest pain, shortness of breath, mood changes.   Objective  Blood pressure 132/80, pulse 75, height 5' 3.5" (1.613 m), weight 196 lb (88.9 kg), SpO2 94 %.    General: No apparent distress alert and oriented x3 mood and affect normal, dressed appropriately.  HEENT: Pupils equal, extraocular movements intact  Respiratory: Patient's speak in full sentences and does not appear short of breath  Cardiovascular: No lower extremity edema, non tender, no erythema  Skin: Warm dry  intact with no signs of infection or rash on extremities or on axial skeleton.  Abdomen: Soft nontender  Neuro: Cranial nerves II through XII are intact, neurovascularly intact in all extremities with 2+ DTRs and 2+ pulses.  Lymph: No lymphadenopathy of posterior or anterior cervical chain or axillae bilaterally.  Gait normal with good balance and coordination.  MSK:  tender with full range of motion and good stability and symmetric strength and tone of shoulders, elbows, wrist, hip, and ankles bilaterally.   Significant arthritic changes of the knees bilaterally still with instability notedNo swelling noted.  Lacks last 5 degrees of extension Back exam does have some loss of lordosis with some degenerative scoliosis.  Still having pain with extension.  4-5 strength of the lower extremities bilaterally but symmetric with  good deep tendon reflexes   Impression and Recommendations:     This case required medical decision making of moderate complexity. The above documentation has been reviewed and is accurate and complete Lyndal Pulley, DO       Note: This dictation was prepared with Dragon dictation along with smaller phrase technology. Any transcriptional errors that result from this process are unintentional.

## 2020-01-13 NOTE — Patient Instructions (Signed)
See me in 6-8 weeks 

## 2020-02-15 ENCOUNTER — Telehealth: Payer: Self-pay | Admitting: Family Medicine

## 2020-02-15 DIAGNOSIS — I10 Essential (primary) hypertension: Secondary | ICD-10-CM

## 2020-02-15 NOTE — Telephone Encounter (Signed)
Pt needs refill on amLODipine (NORVASC) 10 MG tablet. Pt does not have any left.

## 2020-02-16 ENCOUNTER — Other Ambulatory Visit: Payer: Self-pay

## 2020-02-16 MED ORDER — AMLODIPINE BESYLATE 10 MG PO TABS: 10.0000 mg | ORAL_TABLET | Freq: Every day | ORAL | 1 refills | Status: DC

## 2020-02-16 NOTE — Telephone Encounter (Signed)
Medication was refilled and sent to pharmacy.  Rebbecca Osuna,cma

## 2020-02-16 NOTE — Telephone Encounter (Signed)
Patient requested amlodipine and this was sent to pharmacy.  Gelila Well,cma

## 2020-02-25 ENCOUNTER — Telehealth: Payer: Self-pay | Admitting: Family Medicine

## 2020-02-25 MED ORDER — CARVEDILOL 12.5 MG PO TABS: 12.5000 mg | ORAL_TABLET | Freq: Two times a day (BID) | ORAL | 1 refills | Status: DC

## 2020-02-25 NOTE — Telephone Encounter (Signed)
Pt needs a refill on carvedilol (COREG) 12.5 MG tablet sent to [facility]

## 2020-03-07 ENCOUNTER — Ambulatory Visit: Payer: Managed Care, Other (non HMO) | Admitting: Family Medicine

## 2020-04-11 ENCOUNTER — Other Ambulatory Visit: Payer: Self-pay | Admitting: Family Medicine

## 2020-04-13 ENCOUNTER — Encounter: Payer: Managed Care, Other (non HMO) | Admitting: Family Medicine

## 2020-04-18 ENCOUNTER — Ambulatory Visit: Payer: Managed Care, Other (non HMO) | Admitting: Family Medicine

## 2020-05-02 ENCOUNTER — Other Ambulatory Visit (HOSPITAL_COMMUNITY)
Admission: RE | Admit: 2020-05-02 | Discharge: 2020-05-02 | Disposition: A | Discharge status: Home / Self Care | Payer: Managed Care, Other (non HMO) | Source: Ambulatory Visit | Attending: Family Medicine | Admitting: Family Medicine

## 2020-05-02 ENCOUNTER — Encounter: Payer: Self-pay | Admitting: Family Medicine

## 2020-05-02 ENCOUNTER — Other Ambulatory Visit: Payer: Self-pay

## 2020-05-02 ENCOUNTER — Ambulatory Visit (INDEPENDENT_AMBULATORY_CARE_PROVIDER_SITE_OTHER): Payer: Managed Care, Other (non HMO) | Admitting: Family Medicine

## 2020-05-02 VITALS — BP 130/80 | HR 69 | Temp 96.1°F | Ht 63.5 in | Wt 193.6 lb

## 2020-05-02 DIAGNOSIS — E785 Hyperlipidemia, unspecified: Secondary | ICD-10-CM

## 2020-05-02 DIAGNOSIS — D709 Neutropenia, unspecified: Secondary | ICD-10-CM

## 2020-05-02 DIAGNOSIS — Z0000 Encounter for general adult medical examination without abnormal findings: Secondary | ICD-10-CM | POA: Diagnosis not present

## 2020-05-02 DIAGNOSIS — Z124 Encounter for screening for malignant neoplasm of cervix: Secondary | ICD-10-CM | POA: Insufficient documentation

## 2020-05-02 DIAGNOSIS — E669 Obesity, unspecified: Secondary | ICD-10-CM

## 2020-05-02 DIAGNOSIS — D72819 Decreased white blood cell count, unspecified: Secondary | ICD-10-CM | POA: Insufficient documentation

## 2020-05-02 DIAGNOSIS — Z13820 Encounter for screening for osteoporosis: Secondary | ICD-10-CM

## 2020-05-02 LAB — CBC WITH DIFFERENTIAL/PLATELET
Basophils Absolute: 0 10*3/uL (ref 0.0–0.1)
Basophils Relative: 0.9 % (ref 0.0–3.0)
Eosinophils Absolute: 0.2 10*3/uL (ref 0.0–0.7)
Eosinophils Relative: 5.3 % — ABNORMAL HIGH (ref 0.0–5.0)
HCT: 34.7 % — ABNORMAL LOW (ref 36.0–46.0)
Hemoglobin: 11.7 g/dL — ABNORMAL LOW (ref 12.0–15.0)
Lymphocytes Relative: 35.2 % (ref 12.0–46.0)
Lymphs Abs: 1.1 10*3/uL (ref 0.7–4.0)
MCHC: 33.7 g/dL (ref 30.0–36.0)
MCV: 87.5 fl (ref 78.0–100.0)
Monocytes Absolute: 0.4 10*3/uL (ref 0.1–1.0)
Monocytes Relative: 13.5 % — ABNORMAL HIGH (ref 3.0–12.0)
Neutro Abs: 1.4 10*3/uL (ref 1.4–7.7)
Neutrophils Relative %: 45.1 % (ref 43.0–77.0)
Platelets: 220 10*3/uL (ref 150.0–400.0)
RBC: 3.96 Mil/uL (ref 3.87–5.11)
RDW: 13.5 % (ref 11.5–15.5)
WBC: 3 10*3/uL — ABNORMAL LOW (ref 4.0–10.5)

## 2020-05-02 LAB — LIPID PANEL
Cholesterol: 133 mg/dL (ref 0–200)
HDL: 56.5 mg/dL (ref >39.00)
LDL Cholesterol: 68 mg/dL (ref 0–99)
NonHDL: 76.85
Total CHOL/HDL Ratio: 2
Triglycerides: 46 mg/dL (ref 0.0–149.0)
VLDL: 9.2 mg/dL (ref 0.0–40.0)

## 2020-05-02 LAB — HEMOGLOBIN A1C: Hgb A1c MFr Bld: 5.2 % (ref 4.6–6.5)

## 2020-05-02 NOTE — Patient Instructions (Signed)
Nice to see you. Please add in some exercise. We will contact you with your Pap smear results and lab results. Please check with your insurance company regarding Shingrix and how much this would cost you. Please call to schedule your DEXA scan.

## 2020-05-02 NOTE — Assessment & Plan Note (Addendum)
Physical exam completed.  Encouraged continued healthy diet.  Discussed adding in exercise at least 2 days a week to start with.  Pap smear to be completed today given prior poor follow-up on Pap smears.  Discussed needing a colonoscopy and discussed the risk of missing a significant lesion such as a polyp or a cancer if she continues to delay her colonoscopy.  Discussed Shingrix.  She will check with her pharmacy on cost.  She reports having had a vaccine at her pharmacy some point in the past though she is unsure which vaccine it was.  We will contact them to see what she has had there.  Labs as outlined below.  She will call to schedule her DEXA scan when she is ready to do so.

## 2020-05-02 NOTE — Progress Notes (Signed)
Tommi Rumps, MD Phone: [phone]  Gail Baker is a 64 y.o. female who presents today for CPE.  Diet: Rice, plain teens, yams, many corn, goat, chicken, fish.  No fast food.  No soda or sweet tea.  No junk food. Exercise: Occasionally gets on the treadmill. Pap smear: 06/14/2016 NIL and negative HPV.  She reports that has been her first Pap smear in many years. Colonoscopy: 11/26/2016 with 3-year recall.  She reports she still trying to pay off part of the bill from that colonoscopy and is waiting until she pays some more off prior to having her colonoscopy. Mammogram: 11/06/2019 negative Family history-  Colon cancer: No  Breast cancer: No  Ovarian cancer: No Menses: No, no postmenopausal bleeding Vaccines-   Flu: Out of season  Tetanus: Up-to-date  Shingles: Due  COVID19: Up-to-date  Pneumonia: Due Hep C Screening: Completed Tobacco use: No Alcohol use: No Illicit Drug use: No Dentist: No Ophthalmology: Yes   Active Ambulatory Problems    Diagnosis Date Noted  . Routine general medical examination at a health care facility 06/15/2016  . Hypertension 06/15/2016  . Atrophic vaginitis 12/03/2016  . Atherosclerosis of aorta ([place]) 01/07/2017  . Hyperlipidemia 01/07/2017  . Obesity (BMI 30.0-34.9) 01/23/2018  . Chronic low back pain 01/23/2018  . Lumbar spinal stenosis 03/04/2018  . Pes anserinus bursitis of both knees 07/29/2018  . Bilateral shoulder pain 01/07/2019  . Pes planus 01/07/2019  . Left hip pain 03/04/2019  . GERD (gastroesophageal reflux disease) 04/01/2019  . Toe pain, right 06/08/2019  . Degenerative arthritis of knee, bilateral 08/05/2019  . Tubular adenoma of colon 10/14/2019  . Leukopenia 05/02/2020   Resolved Ambulatory Problems    Diagnosis Date Noted  . Vaginal irritation 06/15/2016  . Dysuria 08/06/2016  . Unintentional weight loss 09/10/2016  . Hematochezia 09/10/2016  . Thoracic back pain 12/03/2016  . Helicobacter pylori  gastritis 12/03/2016  . Abdominal pain 07/22/2017   Past Medical History:  Diagnosis Date  . Asthma   . H. pylori infection   . History of UTI   . Hyperthyroidism     Family History  Problem Relation Age of Onset  . Hypertension Mother   . Hypertension Father   . Diabetes Father   . Colon cancer Neg Hx   . Colon polyps Neg Hx   . Pancreatic cancer Neg Hx   . Rectal cancer Neg Hx   . Stomach cancer Neg Hx   . Breast cancer Neg Hx     Social History   Socioeconomic History  . Marital status: Single    Spouse name: Not on file  . Number of children: 1  . Years of education: Not on file  . Highest education level: Not on file  Occupational History  . Not on file  Tobacco Use  . Smoking status: Never Smoker  . Smokeless tobacco: Never Used  Substance and Sexual Activity  . Alcohol use: No    Alcohol/week: 0.0 standard drinks  . Drug use: No  . Sexual activity: Not on file  Other Topics Concern  . Not on file  Social History Narrative  . Not on file   Social Determinants of Health   Financial Resource Strain:   . Difficulty of Paying Living Expenses:   Food Insecurity:   . Worried About Charity fundraiser in the Last Year:   . Arboriculturist in the Last Year:   Transportation Needs:   . Film/video editor (Medical):   Marland Kitchen  Lack of Transportation (Non-Medical):   Physical Activity:   . Days of Exercise per Week:   . Minutes of Exercise per Session:   Stress:   . Feeling of Stress :   Social Connections:   . Frequency of Communication with Friends and Family:   . Frequency of Social Gatherings with Friends and Family:   . Attends Religious Services:   . Active Member of Clubs or Organizations:   . Attends Archivist Meetings:   Marland Kitchen Marital Status:   Intimate Partner Violence:   . Fear of Current or Ex-Partner:   . Emotionally Abused:   Marland Kitchen Physically Abused:   . Sexually Abused:     ROS  General:  Negative for nexplained weight loss,  fever Skin: Negative for new or changing mole, sore that won't heal HEENT: Negative for trouble hearing, trouble seeing, ringing in ears, mouth sores, hoarseness, change in voice, dysphagia. CV:  Negative for chest pain, dyspnea, edema, palpitations Resp: Negative for cough, dyspnea, hemoptysis GI: Negative for nausea, vomiting, diarrhea, constipation, abdominal pain, melena, hematochezia. GU: Negative for dysuria, incontinence, urinary hesitance, hematuria, vaginal or penile discharge, polyuria, sexual difficulty, lumps in testicle or breasts MSK: Negative for muscle cramps or aches, joint pain or swelling Neuro: Negative for headaches, weakness, numbness, dizziness, passing out/fainting Psych: Negative for depression, anxiety, memory problems  Objective  Physical Exam Vitals:   05/02/20 1002  BP: 130/80  Pulse: 69  Temp: (!) 96.1 F (35.6 C)  SpO2: 99%    BP Readings from Last 3 Encounters:  05/02/20 130/80  01/13/20 132/80  12/09/19 122/62   Wt Readings from Last 3 Encounters:  05/02/20 193 lb 9.6 oz (87.8 kg)  01/13/20 196 lb (88.9 kg)  12/09/19 196 lb (88.9 kg)    Physical Exam Constitutional:      General: She is not in acute distress.    Appearance: She is not diaphoretic.  HENT:     Head: Normocephalic and atraumatic.  Eyes:     Conjunctiva/sclera: Conjunctivae normal.     Pupils: Pupils are equal, round, and reactive to light.  Cardiovascular:     Rate and Rhythm: Normal rate and regular rhythm.     Heart sounds: Normal heart sounds.  Pulmonary:     Effort: Pulmonary effort is normal.     Breath sounds: Normal breath sounds.  Abdominal:     General: Bowel sounds are normal. There is no distension.     Palpations: Abdomen is soft.     Tenderness: There is no abdominal tenderness. There is no guarding or rebound.  Genitourinary:    Comments: Fulton Mole, CMA served as chaperone, normal labia, normal vaginal mucosa, normal cervix, normal bimanual exam  with no adnexal masses or tenderness, bilateral breast with no skin changes, nipple inversion, masses, or tenderness, no axillary masses bilaterally Musculoskeletal:     Right lower leg: No edema.     Left lower leg: No edema.  Lymphadenopathy:     Cervical: No cervical adenopathy.  Skin:    General: Skin is warm and dry.  Neurological:     Mental Status: She is alert.  Psychiatric:        Mood and Affect: Mood normal.      Assessment/Plan:   Routine general medical examination at a health care facility Physical exam completed.  Encouraged continued healthy diet.  Discussed adding in exercise at least 2 days a week to start with.  Pap smear to be completed today given prior  poor follow-up on Pap smears.  Discussed needing a colonoscopy and discussed the risk of missing a significant lesion such as a polyp or a cancer if she continues to delay her colonoscopy.  Discussed Shingrix.  She will check with her pharmacy on cost.  She reports having had a vaccine at her pharmacy some point in the past though she is unsure which vaccine it was.  We will contact them to see what she has had there.  Labs as outlined below.  She will call to schedule her DEXA scan when she is ready to do so.  Leukopenia Noted on lab work from December from sports medicine.  We will recheck.   Orders Placed This Encounter  Procedures  . DG Bone Density    Standing Status:   Future    Standing Expiration Date:   07/02/2021    Order Specific Question:   Reason for Exam (SYMPTOM  OR DIAGNOSIS REQUIRED)    Answer:   osteoporosis screening    Order Specific Question:   Preferred imaging location?    Answer:   [facility]  . Lipid panel  . HgB A1c  . CBC w/Diff    No orders of the defined types were placed in this encounter.   This visit occurred during the SARS-CoV-2 public health emergency.  Safety protocols were in place, including screening questions prior to the visit, additional usage of staff PPE,  and extensive cleaning of exam room while observing appropriate contact time as indicated for disinfecting solutions.    Tommi Rumps, MD [place]

## 2020-05-02 NOTE — Assessment & Plan Note (Signed)
Noted on lab work from December from sports medicine.  We will recheck.

## 2020-05-03 LAB — CYTOLOGY - PAP
Comment: NEGATIVE
Diagnosis: NEGATIVE
High risk HPV: NEGATIVE

## 2020-05-06 ENCOUNTER — Other Ambulatory Visit: Payer: Self-pay | Admitting: Family Medicine

## 2020-05-06 DIAGNOSIS — D72819 Decreased white blood cell count, unspecified: Secondary | ICD-10-CM

## 2020-05-10 ENCOUNTER — Encounter: Payer: Self-pay | Admitting: Internal Medicine

## 2020-05-10 ENCOUNTER — Other Ambulatory Visit: Payer: Self-pay

## 2020-05-10 ENCOUNTER — Inpatient Hospital Stay: Payer: Managed Care, Other (non HMO)

## 2020-05-10 ENCOUNTER — Inpatient Hospital Stay: Payer: Managed Care, Other (non HMO) | Attending: Internal Medicine | Admitting: Internal Medicine

## 2020-05-10 DIAGNOSIS — D72819 Decreased white blood cell count, unspecified: Secondary | ICD-10-CM

## 2020-05-10 DIAGNOSIS — D649 Anemia, unspecified: Secondary | ICD-10-CM | POA: Insufficient documentation

## 2020-05-10 DIAGNOSIS — D709 Neutropenia, unspecified: Secondary | ICD-10-CM

## 2020-05-10 DIAGNOSIS — M2550 Pain in unspecified joint: Secondary | ICD-10-CM

## 2020-05-10 HISTORY — DX: Anemia, unspecified: D64.9

## 2020-05-10 LAB — CBC WITH DIFFERENTIAL/PLATELET
Abs Immature Granulocytes: 0 10*3/uL (ref 0.00–0.07)
Basophils Absolute: 0 10*3/uL (ref 0.0–0.1)
Basophils Relative: 1 %
Eosinophils Absolute: 0.1 10*3/uL (ref 0.0–0.5)
Eosinophils Relative: 4 %
HCT: 35.5 % — ABNORMAL LOW (ref 36.0–46.0)
Hemoglobin: 12.2 g/dL (ref 12.0–15.0)
Immature Granulocytes: 0 %
Lymphocytes Relative: 31 %
Lymphs Abs: 1 10*3/uL (ref 0.7–4.0)
MCH: 28.8 pg (ref 26.0–34.0)
MCHC: 34.4 g/dL (ref 30.0–36.0)
MCV: 83.9 fL (ref 80.0–100.0)
Monocytes Absolute: 0.4 10*3/uL (ref 0.1–1.0)
Monocytes Relative: 12 %
Neutro Abs: 1.8 10*3/uL (ref 1.7–7.7)
Neutrophils Relative %: 52 %
Platelets: 229 10*3/uL (ref 150–400)
RBC: 4.23 MIL/uL (ref 3.87–5.11)
RDW: 13.2 % (ref 11.5–15.5)
WBC: 3.3 10*3/uL — ABNORMAL LOW (ref 4.0–10.5)
nRBC: 0 % (ref 0.0–0.2)

## 2020-05-10 LAB — COMPREHENSIVE METABOLIC PANEL
ALT: 14 U/L (ref 0–44)
AST: 21 U/L (ref 15–41)
Albumin: 4.3 g/dL (ref 3.5–5.0)
Alkaline Phosphatase: 57 U/L (ref 38–126)
Anion gap: 8 (ref 5–15)
BUN: 11 mg/dL (ref 8–23)
CO2: 28 mmol/L (ref 22–32)
Calcium: 9.5 mg/dL (ref 8.9–10.3)
Chloride: 104 mmol/L (ref 98–111)
Creatinine, Ser: 0.7 mg/dL (ref 0.44–1.00)
GFR calc Af Amer: >60 mL/min (ref >60)
GFR calc non Af Amer: >60 mL/min (ref >60)
Glucose, Bld: 96 mg/dL (ref 70–99)
Potassium: 3.6 mmol/L (ref 3.5–5.1)
Sodium: 140 mmol/L (ref 135–145)
Total Bilirubin: 0.8 mg/dL (ref 0.3–1.2)
Total Protein: 8.3 g/dL — ABNORMAL HIGH (ref 6.5–8.1)

## 2020-05-10 LAB — HEPATITIS B SURFACE ANTIGEN: Hepatitis B Surface Ag: NONREACTIVE

## 2020-05-10 LAB — HIV ANTIBODY (ROUTINE TESTING W REFLEX): HIV Screen 4th Generation wRfx: NONREACTIVE

## 2020-05-10 LAB — TECHNOLOGIST SMEAR REVIEW
Plt Morphology: NORMAL
RBC Morphology: NORMAL

## 2020-05-10 LAB — LACTATE DEHYDROGENASE: LDH: 141 U/L (ref 98–192)

## 2020-05-10 NOTE — Assessment & Plan Note (Addendum)
#  Nonspecific leukopenia-asymptomatic.  Normal differential.  Chronic for the last 3 to 4 years.  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  #Recommend checking CBC CMP LDH; peripheral smear; HIV hepatitis panel.  Thank you Dr.Sonnenberg for allowing me to participate in the care of your pleasant patient. Please do not hesitate to contact me with questions or concerns in the interim.  # DISPOSITION:  # labs today # follow up in 2 weeks-MD; No labs- Dr.B

## 2020-05-10 NOTE — Progress Notes (Signed)
New patient visit, referred for leukopenia by  Dr Caryl Bis.

## 2020-05-10 NOTE — Progress Notes (Signed)
[facility] CONSULT NOTE  Patient Care Team: Leone Haven, MD as PCP - General (Family Medicine)  CHIEF COMPLAINTS/PURPOSE OF CONSULTATION: Leukopenia  #Leukopenia white count 3.3; normal differential [since 2017]; no significant anemia/thrombocytopenia.  # EGD/ colonoscopy- 3 years [Dr.Pyrtle; GI] Oncology History   No history exists.     HISTORY OF PRESENTING ILLNESS:  Gail Baker 64 y.o.  female has been referred to [place] for evaluation recommendations for leukopenia.  Patient denies any frequent infections.  Denies any frequent use of antibiotics or hospitalizations.  No skin rash.  Chronic joint pains not any worse.   Review of Systems  Constitutional: Negative for chills, diaphoresis, fever, malaise/fatigue and weight loss.  HENT: Negative for nosebleeds and sore throat.   Eyes: Negative for double vision.  Respiratory: Negative for cough, hemoptysis, sputum production, shortness of breath and wheezing.   Cardiovascular: Negative for chest pain, palpitations, orthopnea and leg swelling.  Gastrointestinal: Negative for abdominal pain, blood in stool, constipation, diarrhea, heartburn, melena, nausea and vomiting.  Genitourinary: Negative for dysuria, frequency and urgency.  Musculoskeletal: Positive for back pain and joint pain.  Skin: Negative.  Negative for itching and rash.  Neurological: Negative for dizziness, tingling, focal weakness, weakness and headaches.  Endo/Heme/Allergies: Does not bruise/bleed easily.  Psychiatric/Behavioral: Negative for depression. The patient is not nervous/anxious and does not have insomnia.      MEDICAL HISTORY:  Past Medical History:  Diagnosis Date  . Asthma   . GERD (gastroesophageal reflux disease) 04/01/2019  . H. pylori infection   . History of UTI   . Hyperlipidemia   . Hypertension   . Hyperthyroidism   . Normocytic anemia 05/10/2020  . Pes anserinus bursitis of both knees 07/29/2018  . Tubular adenoma  of colon     SURGICAL HISTORY: Past Surgical History:  Procedure Laterality Date  . BREAST BIOPSY Right 08/15/2017   FIBROADENOMATOID CHANGE   . CESAREAN SECTION      SOCIAL HISTORY: Social History   Socioeconomic History  . Marital status: Single    Spouse name: Not on file  . Number of children: 1  . Years of education: Not on file  . Highest education level: Not on file  Occupational History  . Not on file  Tobacco Use  . Smoking status: Never Smoker  . Smokeless tobacco: Never Used  Substance and Sexual Activity  . Alcohol use: No    Alcohol/week: 0.0 standard drinks  . Drug use: No  . Sexual activity: Not on file  Other Topics Concern  . 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    Social Determinants of Health   Financial Resource Strain:   . Difficulty of Paying Living Expenses:   Food Insecurity:   . Worried About Charity fundraiser in the Last Year:   . Arboriculturist in the Last Year:   Transportation Needs:   . Film/video editor (Medical):   Marland Kitchen Lack of Transportation (Non-Medical):   Physical Activity:   . Days of Exercise per Week:   . Minutes of Exercise per Session:   Stress:   . Feeling of Stress :   Social Connections:   . Frequency of Communication with Friends and Family:   . Frequency of Social Gatherings with Friends and Family:   . Attends Religious Services:   . Active Member of Clubs or Organizations:   .  Attends Archivist Meetings:   Marland Kitchen Marital Status:   Intimate Partner Violence:   . Fear of Current or Ex-Partner:   . Emotionally Abused:   Marland Kitchen Physically Abused:   . Sexually Abused:     FAMILY HISTORY: Family History  Problem Relation Age of Onset  . Hypertension Mother   . Hypertension Father   . Diabetes Father   . Colon cancer Neg Hx   . Colon polyps Neg Hx   . Pancreatic cancer Neg Hx   . Rectal cancer Neg Hx   .  Stomach cancer Neg Hx   . Breast cancer Neg Hx     ALLERGIES:  has No Known Allergies.  MEDICATIONS:  Current Outpatient Medications  Medication Sig Dispense Refill  . amLODipine (NORVASC) 10 MG tablet Take 1 tablet (10 mg total) by mouth daily. 90 tablet 1  . aspirin EC 81 MG tablet Take 81 mg by mouth daily.    . carvedilol (COREG) 12.5 MG tablet Take 1 tablet (12.5 mg total) by mouth 2 (two) times daily with a meal. 180 tablet 1  . Cholecalciferol (VITAMIN D3) 125 MCG (5000 UT) TABS Take by mouth. Pt takes 2 daily    . gabapentin (NEURONTIN) 100 MG capsule Take 2 capsules (200 mg total) by mouth at bedtime. (Patient not taking: Reported on 05/24/2020) 180 capsule 0  . gabapentin (NEURONTIN) 300 MG capsule Take 1 capsule (300 mg total) by mouth every evening. (Patient not taking: Reported on 05/24/2020) 30 capsule 0  . hydrochlorothiazide (HYDRODIURIL) 25 MG tablet TAKE ONE TABLET BY MOUTH DAILY 90 tablet 0  . Probiotic Product (DIGESTIVE ADVANTAGE PO) Take by mouth daily.    . rosuvastatin (CRESTOR) 10 MG tablet Take 1 tablet (10 mg total) by mouth daily. 90 tablet 3  . traMADol (ULTRAM) 50 MG tablet Take 1 tablet (50 mg total) by mouth every 12 (twelve) hours as needed. 30 tablet 0  . Diclofenac Sodium (PENNSAID) 2 % SOLN Place 2 g onto the skin 2 (two) times daily. (Patient not taking: Reported on 05/24/2020) 112 g 3  . dicyclomine (BENTYL) 10 MG capsule TAKE ONE CAPSULE BY MOUTH THREE TIMES A DAY AS NEEDED FOR CRAMPS 90 capsule 1   No current facility-administered medications for this visit.      Marland Kitchen  PHYSICAL EXAMINATION: ECOG PERFORMANCE STATUS: 0 - Asymptomatic  Vitals:   05/10/20 1139  BP: (!) 161/90  Pulse: 82  Resp: 18  Temp: 97.7 F (36.5 C)  SpO2: 100%   Filed Weights   05/10/20 1139  Weight: 192 lb 9.6 oz (87.4 kg)    Physical Exam  Constitutional: She is oriented to person, place, and time and well-developed, well-nourished, and in no distress.  HENT:  Head:  Normocephalic and atraumatic.  Mouth/Throat: Oropharynx is clear and moist. No oropharyngeal exudate.  Eyes: Pupils are equal, round, and reactive to light.  Cardiovascular: Normal rate and regular rhythm.  Pulmonary/Chest: Effort normal and breath sounds normal. No respiratory distress. She has no wheezes.  Abdominal: Soft. Bowel sounds are normal. She exhibits no distension and no mass. There is no abdominal tenderness. There is no rebound and no guarding.  Musculoskeletal:        General: No tenderness or edema. Normal range of motion.     Cervical back: Normal range of motion and neck supple.  Neurological: She is alert and oriented to person, place, and time.  Skin: Skin is warm.  Psychiatric: Affect normal.  LABORATORY DATA:  I have reviewed the data as listed Lab Results  Component Value Date   WBC 3.3 (L) 05/10/2020   HGB 12.2 05/10/2020   HCT 35.5 (L) 05/10/2020   MCV 83.9 05/10/2020   PLT 229 05/10/2020   Recent Labs    12/09/19 1008 05/10/20 1208  NA 140 140  K 3.7 3.6  CL 104 104  CO2 30 28  GLUCOSE 93 96  BUN 11 11  CREATININE 0.67 0.70  CALCIUM 9.9  9.5 9.5  GFRNONAA  --  >60  GFRAA  --  >60  PROT 8.0 8.3*  ALBUMIN 4.3 4.3  AST 18 21  ALT 12 14  ALKPHOS 56 57  BILITOT 0.6 0.8    RADIOGRAPHIC STUDIES: I have personally reviewed the radiological images as listed and agreed with the findings in the report. No results found.  ASSESSMENT & PLAN:   Leukopenia #Nonspecific leukopenia-asymptomatic.  Normal differential.  Chronic for the last 3 to 4 years.  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  #Recommend checking CBC CMP LDH; peripheral smear; HIV hepatitis panel.  Thank you Dr.Sonnenberg for allowing me to participate in the care of your pleasant patient. Please do not hesitate to contact me with questions or concerns in the interim.  # DISPOSITION:  # labs today # follow up in 2 weeks-MD; No labs- Dr.B  All questions were  answered. The patient knows to call the clinic with any problems, questions or concerns.    Cammie Sickle, MD 05/24/2020 11:13 AM

## 2020-05-24 ENCOUNTER — Other Ambulatory Visit: Payer: Self-pay

## 2020-05-24 ENCOUNTER — Inpatient Hospital Stay: Payer: Managed Care, Other (non HMO) | Admitting: Internal Medicine

## 2020-05-24 ENCOUNTER — Encounter: Payer: Self-pay | Admitting: Internal Medicine

## 2020-05-24 DIAGNOSIS — D72819 Decreased white blood cell count, unspecified: Secondary | ICD-10-CM | POA: Diagnosis not present

## 2020-05-24 DIAGNOSIS — D709 Neutropenia, unspecified: Secondary | ICD-10-CM

## 2020-05-24 NOTE — Progress Notes (Signed)
[facility] NOTE  Patient Care Team: Leone Haven, MD as PCP - General (Family Medicine)  CHIEF COMPLAINTS/PURPOSE OF CONSULTATION: Leukopenia  # EGD/ colonoscopy- 3 years [Dr.Pyrtle; GI] Oncology History   No history exists.     HISTORY OF PRESENTING ILLNESS:  Gail Baker 64 y.o.  female is here for follow-up of her work-up for leukopenia.  Patient currently denies any frequent infections but denies any skin rash.  Any worsening joint pains or bone pain.   Review of Systems  Constitutional: Negative for chills, diaphoresis, fever, malaise/fatigue and weight loss.  HENT: Negative for nosebleeds and sore throat.   Eyes: Negative for double vision.  Respiratory: Negative for cough, hemoptysis, sputum production, shortness of breath and wheezing.   Cardiovascular: Negative for chest pain, palpitations, orthopnea and leg swelling.  Gastrointestinal: Negative for abdominal pain, blood in stool, constipation, diarrhea, heartburn, melena, nausea and vomiting.  Genitourinary: Negative for dysuria, frequency and urgency.  Musculoskeletal: Positive for back pain and joint pain.  Skin: Negative.  Negative for itching and rash.  Neurological: Negative for dizziness, tingling, focal weakness, weakness and headaches.  Endo/Heme/Allergies: Does not bruise/bleed easily.  Psychiatric/Behavioral: Negative for depression. The patient is not nervous/anxious and does not have insomnia.      MEDICAL HISTORY:  Past Medical History:  Diagnosis Date  . Asthma   . GERD (gastroesophageal reflux disease) 04/01/2019  . H. pylori infection   . History of UTI   . Hyperlipidemia   . Hypertension   . Hyperthyroidism   . Normocytic anemia 05/10/2020  . Pes anserinus bursitis of both knees 07/29/2018  . Tubular adenoma of colon     SURGICAL HISTORY: Past Surgical History:  Procedure Laterality Date  . BREAST BIOPSY Right 08/15/2017   FIBROADENOMATOID CHANGE   .  CESAREAN SECTION      SOCIAL HISTORY: Social History   Socioeconomic History  . Marital status: Single    Spouse name: Not on file  . Number of children: 1  . Years of education: Not on file  . Highest education level: Not on file  Occupational History  . Not on file  Tobacco Use  . Smoking status: Never Smoker  . Smokeless tobacco: Never Used  Substance and Sexual Activity  . Alcohol use: No    Alcohol/week: 0.0 standard drinks  . Drug use: No  . Sexual activity: Not on file  Other Topics Concern  . 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    Social Determinants of Health   Financial Resource Strain:   . Difficulty of Paying Living Expenses:   Food Insecurity:   . Worried About Charity fundraiser in the Last Year:   . Arboriculturist in the Last Year:   Transportation Needs:   . Film/video editor (Medical):   Marland Kitchen Lack of Transportation (Non-Medical):   Physical Activity:   . Days of Exercise per Week:   . Minutes of Exercise per Session:   Stress:   . Feeling of Stress :   Social Connections:   . Frequency of Communication with Friends and Family:   . Frequency of Social Gatherings with Friends and Family:   . Attends Religious Services:   . Active Member of Clubs or Organizations:   . Attends Archivist Meetings:   Marland Kitchen Marital Status:   Intimate Partner Violence:   .  Fear of Current or Ex-Partner:   . Emotionally Abused:   Marland Kitchen Physically Abused:   . Sexually Abused:     FAMILY HISTORY: Family History  Problem Relation Age of Onset  . Hypertension Mother   . Hypertension Father   . Diabetes Father   . Colon cancer Neg Hx   . Colon polyps Neg Hx   . Pancreatic cancer Neg Hx   . Rectal cancer Neg Hx   . Stomach cancer Neg Hx   . Breast cancer Neg Hx     ALLERGIES:  has No Known Allergies.  MEDICATIONS:  Current Outpatient Medications  Medication  Sig Dispense Refill  . amLODipine (NORVASC) 10 MG tablet Take 1 tablet (10 mg total) by mouth daily. 90 tablet 1  . aspirin EC 81 MG tablet Take 81 mg by mouth daily.    . carvedilol (COREG) 12.5 MG tablet Take 1 tablet (12.5 mg total) by mouth 2 (two) times daily with a meal. 180 tablet 1  . Cholecalciferol (VITAMIN D3) 125 MCG (5000 UT) TABS Take by mouth. Pt takes 2 daily    . dicyclomine (BENTYL) 10 MG capsule TAKE ONE CAPSULE BY MOUTH THREE TIMES A DAY AS NEEDED FOR CRAMPS 90 capsule 1  . hydrochlorothiazide (HYDRODIURIL) 25 MG tablet TAKE ONE TABLET BY MOUTH DAILY 90 tablet 0  . Probiotic Product (DIGESTIVE ADVANTAGE PO) Take by mouth daily.    . rosuvastatin (CRESTOR) 10 MG tablet Take 1 tablet (10 mg total) by mouth daily. 90 tablet 3  . traMADol (ULTRAM) 50 MG tablet Take 1 tablet (50 mg total) by mouth every 12 (twelve) hours as needed. 30 tablet 0  . Diclofenac Sodium (PENNSAID) 2 % SOLN Place 2 g onto the skin 2 (two) times daily. (Patient not taking: Reported on 05/24/2020) 112 g 3  . gabapentin (NEURONTIN) 100 MG capsule Take 2 capsules (200 mg total) by mouth at bedtime. (Patient not taking: Reported on 05/24/2020) 180 capsule 0  . gabapentin (NEURONTIN) 300 MG capsule Take 1 capsule (300 mg total) by mouth every evening. (Patient not taking: Reported on 05/24/2020) 30 capsule 0   No current facility-administered medications for this visit.      Marland Kitchen  PHYSICAL EXAMINATION:   Vitals:   05/24/20 1044  BP: (!) 157/87  Pulse: 75  Temp: 98.5 F (36.9 C)   Filed Weights   05/24/20 1044  Weight: 189 lb 6 oz (85.9 kg)    Physical Exam  Constitutional: She is oriented to person, place, and time and well-developed, well-nourished, and in no distress.  HENT:  Head: Normocephalic and atraumatic.  Mouth/Throat: Oropharynx is clear and moist. No oropharyngeal exudate.  Eyes: Pupils are equal, round, and reactive to light.  Cardiovascular: Normal rate and regular rhythm.   Pulmonary/Chest: Effort normal and breath sounds normal. No respiratory distress. She has no wheezes.  Abdominal: Soft. Bowel sounds are normal. She exhibits no distension and no mass. There is no abdominal tenderness. There is no rebound and no guarding.  Musculoskeletal:        General: No tenderness or edema. Normal range of motion.     Cervical back: Normal range of motion and neck supple.  Neurological: She is alert and oriented to person, place, and time.  Skin: Skin is warm.  Psychiatric: Affect normal.     LABORATORY DATA:  I have reviewed the data as listed Lab Results  Component Value Date   WBC 3.3 (L) 05/10/2020   HGB 12.2 05/10/2020  HCT 35.5 (L) 05/10/2020   MCV 83.9 05/10/2020   PLT 229 05/10/2020   Recent Labs    12/09/19 1008 05/10/20 1208  NA 140 140  K 3.7 3.6  CL 104 104  CO2 30 28  GLUCOSE 93 96  BUN 11 11  CREATININE 0.67 0.70  CALCIUM 9.9  9.5 9.5  GFRNONAA  --  >60  GFRAA  --  >60  PROT 8.0 8.3*  ALBUMIN 4.3 4.3  AST 18 21  ALT 12 14  ALKPHOS 56 57  BILITOT 0.6 0.8    RADIOGRAPHIC STUDIES: I have personally reviewed the radiological images as listed and agreed with the findings in the report. No results found.  ASSESSMENT & PLAN:   Leukopenia #Mild leukopenia-without any significant cytopenias noted on the differential.  White count 3.3; differential normal.;  Normal hemoglobin platelets.  Work-up including HIV/hepatitis B; peripheral smear-normal limits.  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 Discussed that however in future if patient's counts get worse-I think bone marrow biopsy is reasonable.  However I would not recommend any further work-up at this time.   # DISPOSITION: # follow up as needed-Dr.B  All questions were answered. The patient knows to call the clinic with any problems, questions or concerns.    Cammie Sickle, MD 05/24/2020 11:08 AM

## 2020-05-24 NOTE — Assessment & Plan Note (Addendum)
#  Mild leukopenia-without any significant cytopenias noted on the differential.  White count 3.3; differential normal.;  Normal hemoglobin platelets.  Work-up including HIV/hepatitis B; peripheral smear-normal limits.  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 Discussed that however in future if patient's counts get worse-I think bone marrow biopsy is reasonable.  However I would not recommend any further work-up at this time.   # DISPOSITION: # follow up as needed-Dr.B

## 2020-06-13 ENCOUNTER — Ambulatory Visit (INDEPENDENT_AMBULATORY_CARE_PROVIDER_SITE_OTHER): Payer: Managed Care, Other (non HMO) | Admitting: Family Medicine

## 2020-06-13 ENCOUNTER — Other Ambulatory Visit: Payer: Self-pay

## 2020-06-13 ENCOUNTER — Encounter: Payer: Self-pay | Admitting: Family Medicine

## 2020-06-13 DIAGNOSIS — M170 Bilateral primary osteoarthritis of knee: Secondary | ICD-10-CM | POA: Diagnosis not present

## 2020-06-13 NOTE — Progress Notes (Signed)
[place] Phone: [phone] Subjective:    I'm seeing this patient by the request  of:  Leone Haven, MD  CC: Bilateral knee pain  TWS:[id number]   01/13/2020 Stable at the moment.  We will continue with the conservative therapy at the moment.  Can repeat injections but seems that viscosupplementation has done relatively well.  Follow-up with me again 6 to 8 weeks spent  25 minutes with patient face-to-face and had greater than 50% of counseling including as described above in assessment and plan.  Update 06/13/2020 Gail Baker is a 64 y.o. female coming in with complaint of bilateral knee pain. Patient states that she has pain on and off. Patient feels maybe her shoes are causing pain in her knees.  Mild increase in instability of the knees.  Patient states over the back seems to be doing relatively well      Past Medical History:  Diagnosis Date  . Asthma   . GERD (gastroesophageal reflux disease) 04/01/2019  . H. pylori infection   . History of UTI   . Hyperlipidemia   . Hypertension   . Hyperthyroidism   . Normocytic anemia 05/10/2020  . Pes anserinus bursitis of both knees 07/29/2018  . Tubular adenoma of colon    Past Surgical History:  Procedure Laterality Date  . BREAST BIOPSY Right 08/15/2017   FIBROADENOMATOID CHANGE   . CESAREAN SECTION     Social History   Socioeconomic History  . Marital status: Single    Spouse name: Not on file  . Number of children: 1  . Years of education: Not on file  . Highest education level: Not on file  Occupational History  . Not on file  Tobacco Use  . Smoking status: Never Smoker  . Smokeless tobacco: Never Used  Vaping Use  . Vaping Use: Never used  Substance and Sexual Activity  . Alcohol use: No    Alcohol/week: 0.0 standard drinks  . Drug use: No  . Sexual activity: Not on file  Other Topics Concern  . Not on file  Social History  Narrative   Lives in [place]; Doctor, hospital at [employer]; 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    Social Determinants of Health   Financial Resource Strain:   . Difficulty of Paying Living Expenses:   Food Insecurity:   . Worried About Charity fundraiser in the Last Year:   . Arboriculturist in the Last Year:   Transportation Needs:   . Film/video editor (Medical):   Marland Kitchen Lack of Transportation (Non-Medical):   Physical Activity:   . Days of Exercise per Week:   . Minutes of Exercise per Session:   Stress:   . Feeling of Stress :   Social Connections:   . Frequency of Communication with Friends and Family:   . Frequency of Social Gatherings with Friends and Family:   . Attends Religious Services:   . Active Member of Clubs or Organizations:   . Attends Archivist Meetings:   Marland Kitchen Marital Status:    No Known Allergies Family History  Problem Relation Age of Onset  . Hypertension Mother   . Hypertension Father   . Diabetes Father   . Colon cancer Neg Hx   . Colon polyps Neg Hx   . Pancreatic cancer Neg Hx   . Rectal cancer Neg Hx   . Stomach cancer Neg Hx   .  Breast cancer Neg Hx      Current Outpatient Medications (Cardiovascular):  .  amLODipine (NORVASC) 10 MG tablet, Take 1 tablet (10 mg total) by mouth daily. .  carvedilol (COREG) 12.5 MG tablet, Take 1 tablet (12.5 mg total) by mouth 2 (two) times daily with a meal. .  hydrochlorothiazide (HYDRODIURIL) 25 MG tablet, TAKE ONE TABLET BY MOUTH DAILY .  rosuvastatin (CRESTOR) 10 MG tablet, Take 1 tablet (10 mg total) by mouth daily.   Current Outpatient Medications (Analgesics):  .  aspirin EC 81 MG tablet, Take 81 mg by mouth daily. .  traMADol (ULTRAM) 50 MG tablet, Take 1 tablet (50 mg total) by mouth every 12 (twelve) hours as needed.   Current Outpatient Medications (Other):  Marland Kitchen  Cholecalciferol (VITAMIN D3) 125 MCG (5000 UT) TABS, Take by mouth. Pt takes 2 daily .  Diclofenac Sodium  (PENNSAID) 2 % SOLN, Place 2 g onto the skin 2 (two) times daily. Marland Kitchen  dicyclomine (BENTYL) 10 MG capsule, TAKE ONE CAPSULE BY MOUTH THREE TIMES A DAY AS NEEDED FOR CRAMPS .  gabapentin (NEURONTIN) 100 MG capsule, Take 2 capsules (200 mg total) by mouth at bedtime. .  gabapentin (NEURONTIN) 300 MG capsule, Take 1 capsule (300 mg total) by mouth every evening. .  Probiotic Product (DIGESTIVE ADVANTAGE PO), Take by mouth daily.   Reviewed prior external information including notes and imaging from  primary care provider As well as notes that were available from care everywhere and other healthcare systems.  Past medical history, social, surgical and family history all reviewed in electronic medical record.  No pertanent information unless stated regarding to the chief complaint.   Review of Systems:  No headache, visual changes, nausea, vomiting, diarrhea, constipation, dizziness, abdominal pain, skin rash, fevers, chills, night sweats, weight loss, swollen lymph nodes, body aches, chest pain, shortness of breath, mood changes. POSITIVE muscle aches, joint swelling  Objective  Blood pressure 118/70, pulse 69, height 5\' 1"  (1.549 m), weight 194 lb (88 kg), SpO2 99 %.   General: No apparent distress alert and oriented x3 mood and affect normal, dressed appropriately.  HEENT: Pupils equal, extraocular movements intact  Respiratory: Patient's speak in full sentences and does not appear short of breath  Cardiovascular: No lower extremity edema, non tender, no erythema  Neuro: Cranial nerves II through XII are intact, neurovascularly intact in all extremities with 2+ DTRs and 2+ pulses.  Gait antalgic  Knee: Bilateral valgus deformity noted. Large thigh to calf ratio.  Tender to palpation over medial and PF joint line.  ROM full in flexion and extension and lower leg rotation. instability with valgus force.  painful patellar compression. Patellar glide with moderate crepitus. Patellar and  quadriceps tendons unremarkable. Hamstring and quadriceps strength is normal.   After informed written and verbal consent, patient was seated on exam table. Right knee was prepped with alcohol swab and utilizing anterolateral approach, patient's right knee space was injected with 4:1  marcaine 0.5%: Kenalog 40mg /dL. Patient tolerated the procedure well without immediate complications.  After informed written and verbal consent, patient was seated on exam table. Left knee was prepped with alcohol swab and utilizing anterolateral approach, patient's left knee space was injected with 4:1  marcaine 0.5%: Kenalog 40mg /dL. Patient tolerated the procedure well without immediate complications.   Impression and Recommendations:     The above documentation has been reviewed and is accurate and complete Lyndal Pulley, DO       Note: This dictation was  prepared with Dragon dictation along with smaller phrase technology. Any transcriptional errors that result from this process are unintentional.

## 2020-06-13 NOTE — Patient Instructions (Signed)
See me again in 4-6 weeks  

## 2020-06-13 NOTE — Assessment & Plan Note (Signed)
Bilateral injections given today, chronic problem with worsening symptoms.  Patient wants to avoid any surgical intervention.  Discussed gabapentin, vitamin D and topical anti-inflammatories.  Patient is a candidate again for the viscosupplementation that did seem to help previously.  Patient will come back and see me again in 4 to 6 weeks and hopefully will have approval to start that process again if needed.

## 2020-07-09 ENCOUNTER — Other Ambulatory Visit: Payer: Self-pay | Admitting: Internal Medicine

## 2020-07-25 ENCOUNTER — Other Ambulatory Visit: Payer: Self-pay

## 2020-07-25 ENCOUNTER — Ambulatory Visit (INDEPENDENT_AMBULATORY_CARE_PROVIDER_SITE_OTHER): Payer: Managed Care, Other (non HMO) | Admitting: Family Medicine

## 2020-07-25 ENCOUNTER — Encounter: Payer: Self-pay | Admitting: Family Medicine

## 2020-07-25 VITALS — BP 140/80 | HR 73 | Ht 61.0 in | Wt 192.0 lb

## 2020-07-25 DIAGNOSIS — M545 Low back pain, unspecified: Secondary | ICD-10-CM

## 2020-07-25 DIAGNOSIS — M170 Bilateral primary osteoarthritis of knee: Secondary | ICD-10-CM | POA: Diagnosis not present

## 2020-07-25 DIAGNOSIS — G8929 Other chronic pain: Secondary | ICD-10-CM | POA: Diagnosis not present

## 2020-07-25 DIAGNOSIS — M48062 Spinal stenosis, lumbar region with neurogenic claudication: Secondary | ICD-10-CM | POA: Diagnosis not present

## 2020-07-25 NOTE — Progress Notes (Signed)
[place] [address] [place] Phone: [phone] Subjective:   I Gail Baker am serving as a Education administrator for Dr. Hulan Saas.  This visit occurred during the SARS-CoV-2 public health emergency.  Safety protocols were in place, including screening questions prior to the visit, additional usage of staff PPE, and extensive cleaning of exam room while observing appropriate contact time as indicated for disinfecting solutions.   I'm seeing this patient by the request  of:  Gail Haven, MD  CC:  Bilateral knee pain and back pain follow-up   [id number]   06/13/2020 Bilateral injections given today, chronic problem with worsening symptoms.  Patient wants to avoid any surgical intervention.  Discussed gabapentin, vitamin D and topical anti-inflammatories.  Patient is a candidate again for the viscosupplementation that did seem to help previously.  Patient will come back and see me again in 4 to 6 weeks and hopefully will have approval to start that process again if needed.  Update 07/25/2020 Gail Baker is a 64 y.o. female coming in with complaint of bilateral knee pain. Monovisc approved. Patient states she is doing well.  Patient states that the knee as it did make an improvement with the steroid injection but still has chronic problems.  Still feels like there is some instability of the knee noted.  Patient denies any numbness or tingling.  Patient states back pain seems to be worsening now.  Known arthritic changes with patient's x-rays.  Degenerative changes noted.    Past Medical History:  Diagnosis Date  . Asthma   . GERD (gastroesophageal reflux disease) 04/01/2019  . H. pylori infection   . History of UTI   . Hyperlipidemia   . Hypertension   . Hyperthyroidism   . Normocytic anemia 05/10/2020  . Pes anserinus bursitis of both knees 07/29/2018  . Tubular adenoma of colon    Past Surgical History:  Procedure Laterality  Date  . BREAST BIOPSY Right 08/15/2017   FIBROADENOMATOID CHANGE   . CESAREAN SECTION     Social History   Socioeconomic History  . Marital status: Single    Spouse name: Not on file  . Number of children: 1  . Years of education: Not on file  . Highest education level: Not on file  Occupational History  . Not on file  Tobacco Use  . Smoking status: Never Smoker  . Smokeless tobacco: Never Used  Vaping Use  . Vaping Use: Never used  Substance and Sexual Activity  . Alcohol use: No    Alcohol/week: 0.0 standard drinks  . Drug use: No  . Sexual activity: Not on file  Other Topics Concern  . 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    Social Determinants of Health   Financial Resource Strain:   . Difficulty of Paying Living Expenses:   Food Insecurity:   . Worried About Charity fundraiser in the Last Year:   . Arboriculturist in the Last Year:   Transportation Needs:   . Film/video editor (Medical):   Marland Kitchen Lack of Transportation (Non-Medical):   Physical Activity:   . Days of Exercise per Week:   . Minutes of Exercise per Session:   Stress:   . Feeling of Stress :   Social Connections:   . Frequency of Communication with Friends and Family:   . Frequency of  Social Gatherings with Friends and Family:   . Attends Religious Services:   . Active Member of Clubs or Organizations:   . Attends Archivist Meetings:   Marland Kitchen Marital Status:    No Known Allergies Family History  Problem Relation Age of Onset  . Hypertension Mother   . Hypertension Father   . Diabetes Father   . Colon cancer Neg Hx   . Colon polyps Neg Hx   . Pancreatic cancer Neg Hx   . Rectal cancer Neg Hx   . Stomach cancer Neg Hx   . Breast cancer Neg Hx      Current Outpatient Medications (Cardiovascular):  .  amLODipine (NORVASC) 10 MG tablet, Take 1 tablet (10 mg total) by mouth daily. .   carvedilol (COREG) 12.5 MG tablet, Take 1 tablet (12.5 mg total) by mouth 2 (two) times daily with a meal. .  hydrochlorothiazide (HYDRODIURIL) 25 MG tablet, TAKE ONE TABLET BY MOUTH DAILY .  rosuvastatin (CRESTOR) 10 MG tablet, Take 1 tablet (10 mg total) by mouth daily.   Current Outpatient Medications (Analgesics):  .  aspirin EC 81 MG tablet, Take 81 mg by mouth daily. .  traMADol (ULTRAM) 50 MG tablet, Take 1 tablet (50 mg total) by mouth every 12 (twelve) hours as needed.   Current Outpatient Medications (Other):  Marland Kitchen  Cholecalciferol (VITAMIN D3) 125 MCG (5000 UT) TABS, Take by mouth. Pt takes 2 daily .  Diclofenac Sodium (PENNSAID) 2 % SOLN, Place 2 g onto the skin 2 (two) times daily. Marland Kitchen  dicyclomine (BENTYL) 10 MG capsule, TAKE ONE CAPSULE BY MOUTH THREE TIMES A DAY AS NEEDED FOR CRAMPS .  gabapentin (NEURONTIN) 100 MG capsule, Take 2 capsules (200 mg total) by mouth at bedtime. .  gabapentin (NEURONTIN) 300 MG capsule, Take 1 capsule (300 mg total) by mouth every evening. .  Probiotic Product (DIGESTIVE ADVANTAGE PO), Take by mouth daily.   Reviewed prior external information including notes and imaging from  primary care provider As well as notes that were available from care everywhere and other healthcare systems.  Past medical history, social, surgical and family history all reviewed in electronic medical record.  No pertanent information unless stated regarding to the chief complaint.   Review of Systems:  No headache, visual changes, nausea, vomiting, diarrhea, constipation, dizziness, abdominal pain, skin rash, fevers, chills, night sweats, weight loss, swollen lymph nodes, body aches, joint swelling, chest pain, shortness of breath, mood changes. POSITIVE muscle aches  Objective  Blood pressure (!) 140/80, pulse 73, height 5\' 1"  (1.549 m), weight 192 lb (87.1 kg), SpO2 94 %.   General: No apparent distress alert and oriented x3 mood and affect normal, dressed  appropriately.  HEENT: Pupils equal, extraocular movements intact  Respiratory: Patient's speak in full sentences and does not appear short of breath  Cardiovascular: Trace lower extremity edema, non tender, no erythema  Neuro: Cranial nerves II through XII are intact, neurovascularly intact in all extremities with 2+ DTRs and 2+ pulses.  Gait antalgic MSK: Knee: Bilateral  valgus deformity noted. Large thigh to calf ratio.  Tender to palpation over medial and PF joint line.  ROM full in flexion and extension and lower leg rotation. instability with valgus force.  painful patellar compression. Patellar glide with moderate crepitus. Patellar and quadriceps tendons unremarkable. Hamstring and quadriceps strength is normal.  Low back exam does have loss of lordosis.  Patient does have significant tightness with straight leg test.  Worsening pain with  extension of the back of greater than 5 to 10 degrees.  Patient has tightness with FABER test.  Neurovascular intact with 5 out of 5 strength in lower extremities  After informed written and verbal consent, patient was seated on exam table. Right knee was prepped with alcohol swab and utilizing anterolateral approach, patient's right knee space was injected with 48 mg per 3 mL of Monovisc (sodium hyaluronate) in a prefilled syringe was injected easily into the knee through a 22-gauge needle..Patient tolerated the procedure well without immediate complications.  After informed written and verbal consent, patient was seated on exam table. Left knee was prepped with alcohol swab and utilizing anterolateral approach, patient's left knee space was injected with 48 mg per 3 mL of Monovisc (sodium hyaluronate) in a prefilled syringe was injected easily into the knee through a 22-gauge needle..Patient tolerated the procedure well without immediate complications.      Impression and Recommendations:     The above documentation has been reviewed and is  accurate and complete Lyndal Pulley, DO       Note: This dictation was prepared with Dragon dictation along with smaller phrase technology. Any transcriptional errors that result from this process are unintentional.

## 2020-07-25 NOTE — Assessment & Plan Note (Signed)
Viscosupplementation given again today.  Hopefully patient will respond well.  Has had improved her previously.  Discussed icing regimen and home exercise.  Chronic problem with exacerbation.  Discussed other medications such as the gabapentin.  Follow-up again in 4 to 8 weeks.

## 2020-07-25 NOTE — Assessment & Plan Note (Signed)
Known lumbar spinal stenosis.  Start formal physical therapy that I think will be beneficial.  Discussed the gabapentin again.  Discussed the possibility of epidurals.  If patient does not respond MRI will be necessary and likely epidurals again.

## 2020-07-25 NOTE — Patient Instructions (Signed)
Hope the knees do well PT [address]. For your back NO change me medications Stay active See me again in 5-6 weeks

## 2020-08-16 ENCOUNTER — Telehealth: Payer: Self-pay | Admitting: Family Medicine

## 2020-08-16 DIAGNOSIS — I10 Essential (primary) hypertension: Secondary | ICD-10-CM

## 2020-08-16 MED ORDER — AMLODIPINE BESYLATE 10 MG PO TABS: 10.0000 mg | ORAL_TABLET | Freq: Every day | ORAL | 1 refills | Status: DC

## 2020-08-16 NOTE — Telephone Encounter (Signed)
Patient needs a refill on her amLODipine (NORVASC) 10 MG tablet

## 2020-08-18 ENCOUNTER — Other Ambulatory Visit: Payer: Self-pay

## 2020-08-18 ENCOUNTER — Ambulatory Visit: Payer: Managed Care, Other (non HMO) | Attending: Family Medicine | Admitting: Physical Therapy

## 2020-08-18 ENCOUNTER — Encounter: Payer: Self-pay | Admitting: Physical Therapy

## 2020-08-18 DIAGNOSIS — M545 Low back pain: Secondary | ICD-10-CM | POA: Diagnosis not present

## 2020-08-18 DIAGNOSIS — M6281 Muscle weakness (generalized): Secondary | ICD-10-CM | POA: Insufficient documentation

## 2020-08-18 DIAGNOSIS — G8929 Other chronic pain: Secondary | ICD-10-CM | POA: Diagnosis present

## 2020-08-19 NOTE — Therapy (Addendum)
[place], [place], [address] Phone: [phone]   Fax:  [phone]  Physical Therapy Evaluation/Discharge  Patient Details  Name: Gail Baker MRN: [id number] Date of Birth: Jun 10, 1956 Referring Provider (PT): Hulan Saas, DO   Encounter Date: 08/18/2020   PT End of Session - 08/18/20 1842    Visit Number 1    Number of Visits 7    Date for PT Re-Evaluation 10/01/20    Authorization Type [employer]- no visit limit    PT Start Time 1838    PT Stop Time 1917    PT Time Calculation (min) 39 min    Activity Tolerance Patient tolerated treatment well    Behavior During Therapy [facility] for tasks assessed/performed           Past Medical History:  Diagnosis Date  . Asthma   . GERD (gastroesophageal reflux disease) 04/01/2019  . H. pylori infection   . History of UTI   . Hyperlipidemia   . Hypertension   . Hyperthyroidism   . Normocytic anemia 05/10/2020  . Pes anserinus bursitis of both knees 07/29/2018  . Tubular adenoma of colon     Past Surgical History:  Procedure Laterality Date  . BREAST BIOPSY Right 08/15/2017   FIBROADENOMATOID CHANGE   . CESAREAN SECTION      There were no vitals filed for this visit.    Subjective Assessment - 08/18/20 1843    Subjective Pain begins at midline- pt pointing to sacrum. It spreads to bil lumbar regions. It has been over 3 years.    Patient Stated Goals decrease pain, stand up after being supine or seated, lifting objects    Currently in Pain? Yes    Pain Score 4     Pain Location Back    Pain Orientation Lower    Pain Descriptors / Indicators Aching    Aggravating Factors  first thing in the morning    Pain Relieving Factors heat                          Objective measurements completed on examination: See above findings.               PT Education - 08/19/20 1229    Education Details anatomy of condition, POC, HEP,  FOTO, exercise form/rationale    Person(s) Educated Patient    Methods Explanation;Demonstration;Tactile cues;Verbal cues;Handout    Comprehension Verbalized understanding;Returned demonstration;Verbal cues required;Tactile cues required;Need further instruction            PT Short Term Goals - 08/19/20 1239      PT SHORT TERM GOAL #1   Title pt will be independent with HEP as it has been utilized    Baseline began establishing at eval    Time 3    Period Weeks    Status New    Target Date 09/09/20             PT Long Term Goals - 08/19/20 1235      PT LONG TERM GOAL #1   Title gross strength 5/5    Baseline see flowsheet    Time 6    Period Weeks    Status New    Target Date 10/01/20      PT LONG TERM GOAL #2   Title pt will be able to use stretches/mobility to get out of bed without increased pain    Baseline reports most  notable pain is first thing in the AM    Time 6    Period Weeks    Status New    Target Date 10/01/20      PT LONG TERM GOAL #3   Title Pt will demo proper form with lifting objects floor to counter height    Baseline requires further training    Time 6    Period Weeks    Status New    Target Date 10/01/20                  Plan - 08/18/20 1906    Clinical Impression Statement Pt presents to PT with complaints of chronic LBP. She has notable weakness  around lumbopelvic region that will benefit from training. 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 Pt will benefit from skilled PT in order to address deficits and meet goals.    Personal Factors and Comorbidities Fitness;Comorbidity 1    Comorbidities bil knee pes anserine bursitis    Examination-Activity Limitations Locomotion Level;Transfers;Bed Mobility;Bend;Sit;Carry;Squat;Stairs;Stand;Lift    Examination-Participation Restrictions Cleaning;Meal Prep;Occupation;Driving;Laundry    Stability/Clinical Decision Making Stable/Uncomplicated    Clinical  Decision Making Low    Rehab Potential Good    PT Frequency 1x / week    PT Duration 6 weeks    PT Treatment/Interventions ADLs/Self Care Home Management;Cryotherapy;Electrical Stimulation;Ultrasound;Traction;Moist Heat;Iontophoresis 69m/ml Dexamethasone;Gait training;Stair training;Functional mobility training;Therapeutic activities;Therapeutic exercise;Balance training;Patient/family education;Neuromuscular re-education;Manual techniques;Taping;Dry needling;Passive range of motion;Spinal Manipulations;Joint Manipulations    PT Next Visit Plan progress lumbopelvis stability    PT Home Exercise Plan [id number]    Consulted and Agree with Plan of Care Patient           Patient will benefit from skilled therapeutic intervention in order to improve the following deficits and impairments:  Improper body mechanics, Pain, Postural dysfunction, Increased muscle spasms, Decreased activity tolerance, Decreased strength, Difficulty walking  Visit Diagnosis: Chronic bilateral low back pain without sciatica - Plan: PT plan of care cert/re-cert  Muscle weakness (generalized) - Plan: PT plan of care cert/re-cert     Problem List Patient Active Problem List   Diagnosis Date Noted  . Normocytic anemia 05/10/2020  . Leukopenia 05/02/2020  . Tubular adenoma of colon 10/14/2019  . Degenerative arthritis of knee, bilateral 08/05/2019  . Toe pain, right 06/08/2019  . GERD (gastroesophageal reflux disease) 04/01/2019  . Left hip pain 03/04/2019  . Bilateral shoulder pain 01/07/2019  . Pes planus 01/07/2019  . Pes anserinus bursitis of both knees 07/29/2018  . Lumbar spinal stenosis 03/04/2018  . Obesity (BMI 30.0-34.9) 01/23/2018  . Chronic low back pain 01/23/2018  . Atherosclerosis of aorta ([place] 01/07/2017  . Hyperlipidemia 01/07/2017  . Atrophic vaginitis 12/03/2016  . Routine general medical examination at a health care facility 06/15/2016  . Hypertension 06/15/2016    Gail Baker C. Leopoldo Mazzie  PT, DPT 08/19/20 12:43 PM   [facility] [address]Phone: [phone]  Fax:  [phone] Name: Gail CULLINANEMRN: [id number] of Birth: [birth year]PHYSICAL THERAPY DISCHARGE SUMMARY  Visits from Start of Care: 1  Current functional level related to goals / functional outcomes: See above   Remaining deficits: See above   Education / Equipment: Anatomy of condition, POC, HEP, exercise form/rationale  Plan: Patient agrees to discharge.  Patient goals were not met. Patient is being discharged due to the patient's request.  ?????  Pt LVM requesting  to cancel all appointments.    Deb Loudin C. Keishaun Hazel PT, DPT 09/06/20 1:51 PM

## 2020-08-30 ENCOUNTER — Encounter: Payer: Managed Care, Other (non HMO) | Admitting: Physical Therapy

## 2020-09-05 NOTE — Progress Notes (Signed)
[place] Phone: [phone] Subjective:   [name], am serving as a scribe for Dr. Hulan Saas. This visit occurred during the SARS-CoV-2 public health emergency.  Safety protocols were in place, including screening questions prior to the visit, additional usage of staff PPE, and extensive cleaning of exam room while observing appropriate contact time as indicated for disinfecting solutions.   I'm seeing this patient by the request  of:  Leone Haven, MD  CC: Bilateral knee pain  [id number]   07/25/2020 beneficial.  Discussed the gabapentin again.  Discussed the possibility of epidurals.  If patient does not respond MRI will be necessary and likely epidurals again.  Viscosupplementation given again today.  Hopefully patient will respond well.  Has had improved her previously.  Discussed icing regimen and home exercise.  Chronic problem with exacerbation.  Discussed other medications such as the gabapentin.  Follow-up again in 4 to 8 weeks.  Update 09/06/2020 BRICIA TAHER is a 64 y.o. female coming in with complaint of low back and bilateral knee pain. Patient states that physical therapy had helped to lower her back pain.   Does not feel that gel worked. Has had an increase in her pain in both knees. R>L.  Patient states that the pain gives her more increasing instability.   given monovisc 7/26 bilaterally  In pt for back   Past Medical History:  Diagnosis Date  . Asthma   . GERD (gastroesophageal reflux disease) 04/01/2019  . H. pylori infection   . History of UTI   . Hyperlipidemia   . Hypertension   . Hyperthyroidism   . Normocytic anemia 05/10/2020  . Pes anserinus bursitis of both knees 07/29/2018  . Tubular adenoma of colon    Past Surgical History:  Procedure Laterality Date  . BREAST BIOPSY Right 08/15/2017   FIBROADENOMATOID CHANGE   . CESAREAN SECTION     Social History    Socioeconomic History  . Marital status: Single    Spouse name: Not on file  . Number of children: 1  . Years of education: Not on file  . Highest education level: Not on file  Occupational History  . Not on file  Tobacco Use  . Smoking status: Never Smoker  . Smokeless tobacco: Never Used  Vaping Use  . Vaping Use: Never used  Substance and Sexual Activity  . Alcohol use: No    Alcohol/week: 0.0 standard drinks  . Drug use: No  . Sexual activity: Not on file  Other Topics Concern  . Not on file  Social History Narrative   Lives in [place]; Doctor, hospital at [employer]; 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    Social Determinants of Health   Financial Resource Strain:   . Difficulty of Paying Living Expenses: Not on file  Food Insecurity:   . Worried About Charity fundraiser in the Last Year: Not on file  . Ran Out of Food in the Last Year: Not on file  Transportation Needs:   . Lack of Transportation (Medical): Not on file  . Lack of Transportation (Non-Medical): Not on file  Physical Activity:   . Days of Exercise per Week: Not on file  . Minutes of Exercise per Session: Not on file  Stress:   . Feeling of Stress : Not on file  Social Connections:   . Frequency of Communication with Friends and Family: Not on file  .  Frequency of Social Gatherings with Friends and Family: Not on file  . Attends Religious Services: Not on file  . Active Member of Clubs or Organizations: Not on file  . Attends Archivist Meetings: Not on file  . Marital Status: Not on file   No Known Allergies Family History  Problem Relation Age of Onset  . Hypertension Mother   . Hypertension Father   . Diabetes Father   . Colon cancer Neg Hx   . Colon polyps Neg Hx   . Pancreatic cancer Neg Hx   . Rectal cancer Neg Hx   . Stomach cancer Neg Hx   . Breast cancer Neg Hx      Current Outpatient Medications (Cardiovascular):  .  amLODipine (NORVASC) 10 MG tablet, Take 1  tablet (10 mg total) by mouth daily. .  carvedilol (COREG) 12.5 MG tablet, Take 1 tablet (12.5 mg total) by mouth 2 (two) times daily with a meal. .  hydrochlorothiazide (HYDRODIURIL) 25 MG tablet, TAKE ONE TABLET BY MOUTH DAILY .  rosuvastatin (CRESTOR) 10 MG tablet, Take 1 tablet (10 mg total) by mouth daily.   Current Outpatient Medications (Analgesics):  .  aspirin EC 81 MG tablet, Take 81 mg by mouth daily. .  traMADol (ULTRAM) 50 MG tablet, Take 1 tablet (50 mg total) by mouth every 12 (twelve) hours as needed.   Current Outpatient Medications (Other):  Marland Kitchen  Cholecalciferol (VITAMIN D3) 125 MCG (5000 UT) TABS, Take by mouth. Pt takes 2 daily .  Diclofenac Sodium (PENNSAID) 2 % SOLN, Place 2 g onto the skin 2 (two) times daily. Marland Kitchen  dicyclomine (BENTYL) 10 MG capsule, TAKE ONE CAPSULE BY MOUTH THREE TIMES A DAY AS NEEDED FOR CRAMPS .  gabapentin (NEURONTIN) 100 MG capsule, Take 2 capsules (200 mg total) by mouth at bedtime. .  gabapentin (NEURONTIN) 300 MG capsule, Take 1 capsule (300 mg total) by mouth every evening. .  Probiotic Product (DIGESTIVE ADVANTAGE PO), Take by mouth daily.   Reviewed prior external information including notes and imaging from  primary care provider As well as notes that were available from care everywhere and other healthcare systems.  Past medical history, social, surgical and family history all reviewed in electronic medical record.  No pertanent information unless stated regarding to the chief complaint.   Review of Systems:  No headache, visual changes, nausea, vomiting, diarrhea, constipation, dizziness, abdominal pain, skin rash, fevers, chills, night sweats, weight loss, swollen lymph nodes, body aches, joint swelling, chest pain, shortness of breath, mood changes. POSITIVE muscle aches  Objective  Blood pressure 112/72, pulse 74, height 5\' 1"  (1.549 m), weight 192 lb (87.1 kg), SpO2 99 %.   General: No apparent distress alert and oriented x3 mood  and affect normal, dressed appropriately.  HEENT: Pupils equal, extraocular movements intact  Respiratory: Patient's speak in full sentences and does not appear short of breath  Cardiovascular: No lower extremity edema, non tender, no erythema  Gait severely antalgic gait noted. Knee: Bilateral valgus deformity noted. Large thigh to calf ratio.  Tender to palpation over medial and PF joint line.  ROM lacks last 5 degrees of extension flexion instability with valgus force.  painful patellar compression. Patellar glide with moderate crepitus. Patellar and quadriceps tendons unremarkable. Hamstring and quadriceps strength is normal.  After informed written and verbal consent, patient was seated on exam table. Right knee was prepped with alcohol swab and utilizing anterolateral approach, patient's right knee space was injected with 4:1  marcaine 0.5%: Kenalog 40mg /dL. Patient tolerated the procedure well without immediate complications.  After informed written and verbal consent, patient was seated on exam table. Left knee was prepped with alcohol swab and utilizing anterolateral approach, patient's left knee space was injected with 4:1  marcaine 0.5%: Kenalog 40mg /dL. Patient tolerated the procedure well without immediate complications.   Impression and Recommendations:     The above documentation has been reviewed and is accurate and complete Lyndal Pulley, DO       Note: This dictation was prepared with Dragon dictation along with smaller phrase technology. Any transcriptional errors that result from this process are unintentional.

## 2020-09-06 ENCOUNTER — Encounter: Payer: Self-pay | Admitting: Family Medicine

## 2020-09-06 ENCOUNTER — Ambulatory Visit (INDEPENDENT_AMBULATORY_CARE_PROVIDER_SITE_OTHER): Payer: Managed Care, Other (non HMO) | Admitting: Family Medicine

## 2020-09-06 ENCOUNTER — Other Ambulatory Visit: Payer: Self-pay

## 2020-09-06 DIAGNOSIS — M170 Bilateral primary osteoarthritis of knee: Secondary | ICD-10-CM | POA: Diagnosis not present

## 2020-09-06 NOTE — Patient Instructions (Signed)
See me again in 3 months.

## 2020-09-06 NOTE — Assessment & Plan Note (Signed)
Chronic problem with exacerbation.  He did not respond well to the viscosupplementation.  At this time I would encourage patient to continue with the every 10 to 12 weeks with steroid injections.  Patient is on gabapentin for the back but seems to be doing better with the back.  Discussed icing regimen and home exercises, discussed other over-the-counter medications that could be helpful.  Follow-up again in 4 to 8 weeks

## 2020-09-07 ENCOUNTER — Ambulatory Visit: Payer: Managed Care, Other (non HMO) | Admitting: Physical Therapy

## 2020-09-12 ENCOUNTER — Encounter: Payer: Managed Care, Other (non HMO) | Admitting: Physical Therapy

## 2020-09-19 ENCOUNTER — Encounter: Payer: Managed Care, Other (non HMO) | Admitting: Physical Therapy

## 2020-09-26 ENCOUNTER — Encounter: Payer: Managed Care, Other (non HMO) | Admitting: Physical Therapy

## 2020-10-09 ENCOUNTER — Other Ambulatory Visit: Payer: Self-pay | Admitting: Internal Medicine

## 2020-10-13 ENCOUNTER — Other Ambulatory Visit: Payer: Self-pay | Admitting: Family Medicine

## 2020-10-13 DIAGNOSIS — Z1231 Encounter for screening mammogram for malignant neoplasm of breast: Secondary | ICD-10-CM

## 2020-11-07 ENCOUNTER — Ambulatory Visit: Payer: Managed Care, Other (non HMO) | Admitting: Family Medicine

## 2020-11-07 ENCOUNTER — Encounter: Payer: Self-pay | Admitting: Family Medicine

## 2020-11-07 ENCOUNTER — Other Ambulatory Visit: Payer: Self-pay

## 2020-11-07 DIAGNOSIS — M170 Bilateral primary osteoarthritis of knee: Secondary | ICD-10-CM

## 2020-11-07 DIAGNOSIS — D126 Benign neoplasm of colon, unspecified: Secondary | ICD-10-CM | POA: Diagnosis not present

## 2020-11-07 DIAGNOSIS — E785 Hyperlipidemia, unspecified: Secondary | ICD-10-CM | POA: Diagnosis not present

## 2020-11-07 DIAGNOSIS — I10 Essential (primary) hypertension: Secondary | ICD-10-CM | POA: Diagnosis not present

## 2020-11-07 DIAGNOSIS — R109 Unspecified abdominal pain: Secondary | ICD-10-CM | POA: Insufficient documentation

## 2020-11-07 MED ORDER — TRAMADOL HCL 50 MG PO TABS: 50.0000 mg | ORAL_TABLET | Freq: Two times a day (BID) | ORAL | 0 refills | Status: DC | PRN

## 2020-11-07 NOTE — Assessment & Plan Note (Signed)
Discussed with the patient that she is due for a colonoscopy.  She is working on paying off the bill and working on getting an appointment with her GI physician for this.

## 2020-11-07 NOTE — Assessment & Plan Note (Signed)
Stable.  She will continue to see sports medicine.  Controlled substance database reviewed.  Tramadol refilled.

## 2020-11-07 NOTE — Assessment & Plan Note (Signed)
Well-controlled.  Continue amlodipine 10 mg once daily, carvedilol 12.5 mg twice daily, and HCTZ 25 mg daily.

## 2020-11-07 NOTE — Assessment & Plan Note (Signed)
Continue Crestor 10 mg daily. 

## 2020-11-07 NOTE — Patient Instructions (Signed)
Nice to see you. Please continue to monitor your blood pressure.  If it trends up please let us know. Please monitor for recurrence of your abdominal cramping.  If it does recur please let me know.

## 2020-11-07 NOTE — Assessment & Plan Note (Signed)
Symptoms have resolved.  Benign abdominal exam.  She will monitor for recurrence.

## 2020-11-07 NOTE — Progress Notes (Signed)
Tommi Rumps, MD Phone: [phone]  Gail Baker is a 65 y.o. female who presents today for f/u.  HYPERTENSION  Disease Monitoring  Home BP Monitoring 113-130/70s Chest pain- no    Dyspnea- no Medications  Compliance-  Taking amlodipine, coreg, HCTZ.  Edema- no  HYPERLIPIDEMIA Symptoms Chest pain on exertion:  no   Medications: Compliance- taking crestor Right upper quadrant pain- no  Muscle aches- no  Abdominal pain: Patient notes an episode of abdominal discomfort in her mid abdomen several weeks ago.  Describes it as a cramping sensation after she ate a salad.  No vomiting or diarrhea.  No vaginal discharge or bleeding.  No dysuria.  This has resolved.  Chronic knee pain: This is an ongoing issue.  She rarely takes the tramadol for this.  It does not make her significantly drowsy.  She does not drive if it does make her drowsy.  She does see a sports medicine and injections have been have been helpful.  She needs a refill on tramadol.      Social History   Tobacco Use  Smoking Status Never Smoker  Smokeless Tobacco Never Used     ROS see history of present illness  Objective  Physical Exam Vitals:   11/07/20 1307 11/07/20 1320  BP: 140/76 130/78  Pulse: 86   Temp: 98.9 F (37.2 C)   SpO2: 99%     BP Readings from Last 3 Encounters:  11/07/20 130/78  09/06/20 112/72  07/25/20 (!) 140/80   Wt Readings from Last 3 Encounters:  11/07/20 192 lb 12.8 oz (87.5 kg)  09/06/20 192 lb (87.1 kg)  07/25/20 192 lb (87.1 kg)    Physical Exam Constitutional:      General: She is not in acute distress.    Appearance: She is not diaphoretic.  Cardiovascular:     Rate and Rhythm: Normal rate and regular rhythm.     Heart sounds: Normal heart sounds.  Pulmonary:     Effort: Pulmonary effort is normal.     Breath sounds: Normal breath sounds.  Abdominal:     General: Bowel sounds are normal. There is no distension.     Palpations: Abdomen is soft.      Tenderness: There is no abdominal tenderness. There is no guarding or rebound.  Musculoskeletal:     Right lower leg: No edema.     Left lower leg: No edema.  Skin:    General: Skin is warm and dry.  Neurological:     Mental Status: She is alert.      Assessment/Plan: Please see individual problem list.  Problem List Items Addressed This Visit    Abdominal cramping    Symptoms have resolved.  Benign abdominal exam.  She will monitor for recurrence.      Degenerative arthritis of knee, bilateral    Stable.  She will continue to see sports medicine.  Controlled substance database reviewed.  Tramadol refilled.      Relevant Medications   traMADol (ULTRAM) 50 MG tablet   Hyperlipidemia    Continue Crestor 10 mg daily.      Hypertension    Well-controlled.  Continue amlodipine 10 mg once daily, carvedilol 12.5 mg twice daily, and HCTZ 25 mg daily.      Tubular adenoma of colon    Discussed with the patient that she is due for a colonoscopy.  She is working on paying off the bill and working on getting an appointment with her GI physician for  this.          Health Maintenance: Patient reports her mammogram and DEXA scan are scheduled.  She reports having had her Covid vaccine.  She will get her flu vaccine through work.   This visit occurred during the SARS-CoV-2 public health emergency.  Safety protocols were in place, including screening questions prior to the visit, additional usage of staff PPE, and extensive cleaning of exam room while observing appropriate contact time as indicated for disinfecting solutions.    Tommi Rumps, MD [place]

## 2020-11-10 ENCOUNTER — Other Ambulatory Visit: Payer: Self-pay | Admitting: Family Medicine

## 2020-11-18 ENCOUNTER — Other Ambulatory Visit: Payer: Self-pay | Admitting: Family Medicine

## 2020-11-22 ENCOUNTER — Ambulatory Visit
Admission: RE | Admit: 2020-11-22 | Discharge: 2020-11-22 | Disposition: A | Discharge status: Home / Self Care | Payer: Managed Care, Other (non HMO) | Source: Ambulatory Visit | Attending: Family Medicine | Admitting: Family Medicine

## 2020-11-22 ENCOUNTER — Other Ambulatory Visit: Payer: Self-pay

## 2020-11-22 DIAGNOSIS — Z1231 Encounter for screening mammogram for malignant neoplasm of breast: Secondary | ICD-10-CM | POA: Diagnosis present

## 2020-11-22 DIAGNOSIS — Z13820 Encounter for screening for osteoporosis: Secondary | ICD-10-CM | POA: Diagnosis present

## 2020-12-05 ENCOUNTER — Ambulatory Visit: Payer: Managed Care, Other (non HMO) | Admitting: Family Medicine

## 2020-12-06 IMAGING — DX DG KNEE STANDING AP BILAT
1 series · 1 of 1 positions shown · non-contrast
Comparison: None.

CLINICAL DATA: Chronic bilateral knee pain.

EXAM:
BILATERAL KNEES STANDING - 1 VIEW

[knee ap]
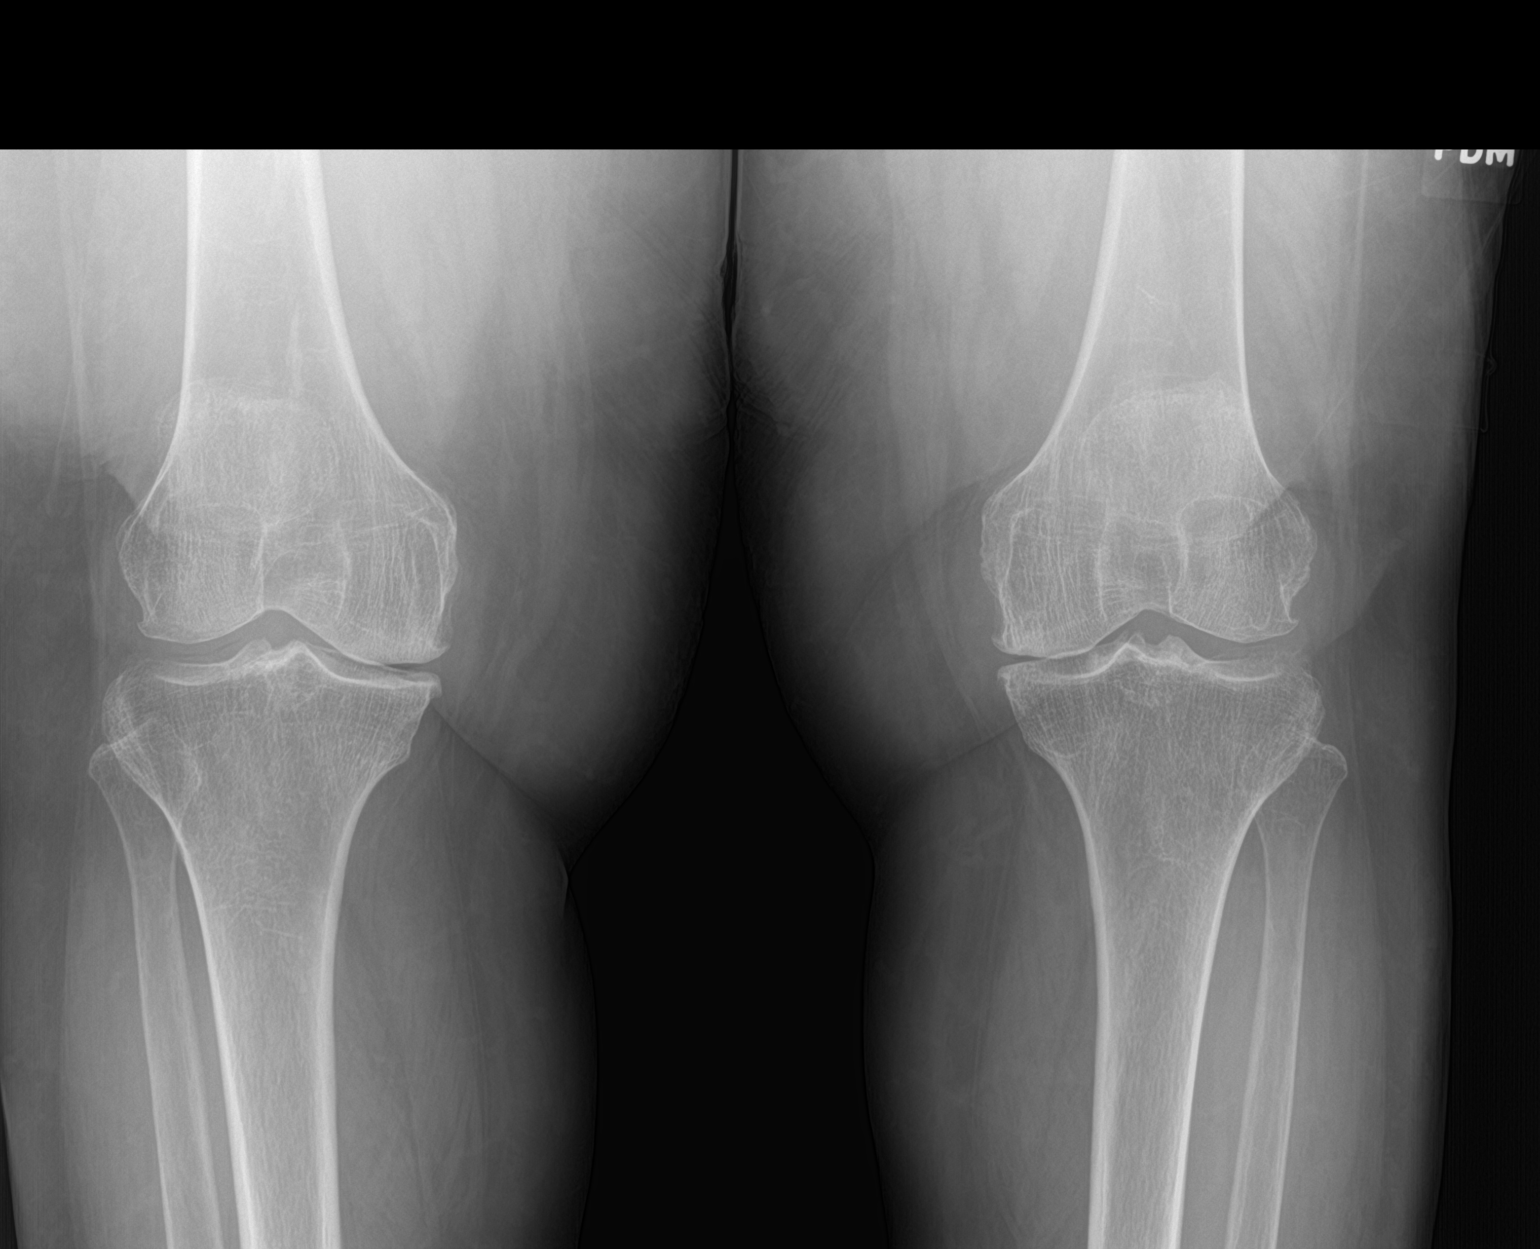

[1 of 1 positions shown; findings below may reference images not displayed]

FINDINGS: Single view of the knees is provided.

No acute bony or joint abnormality is identified. There is
mild-to-moderate bilateral medial compartment joint space narrowing.
Osteophytosis about the medial and lateral compartments appears
worse medially. No chondrocalcinosis.
IMPRESSION: Osteoarthritis about the knees appears most notable in the medial
compartments.

## 2020-12-12 ENCOUNTER — Encounter: Payer: Self-pay | Admitting: Family Medicine

## 2020-12-12 ENCOUNTER — Other Ambulatory Visit: Payer: Self-pay

## 2020-12-12 ENCOUNTER — Ambulatory Visit (INDEPENDENT_AMBULATORY_CARE_PROVIDER_SITE_OTHER): Payer: Managed Care, Other (non HMO) | Admitting: Family Medicine

## 2020-12-12 DIAGNOSIS — M170 Bilateral primary osteoarthritis of knee: Secondary | ICD-10-CM

## 2020-12-12 NOTE — Progress Notes (Signed)
[place] Phone: [phone] Subjective:   Gail Baker, am serving as a scribe for Dr. Hulan Saas. This visit occurred during the SARS-CoV-2 public health emergency.  Safety protocols were in place, including screening questions prior to the visit, additional usage of staff PPE, and extensive cleaning of exam room while observing appropriate contact time as indicated for disinfecting solutions.   I'm seeing this patient by the request  of:  Leone Haven, MD  CC: Low back pain, bilateral knee pain  [id number]   09/06/2020 Chronic problem with exacerbation.  He did not respond well to the viscosupplementation.  At this time I would encourage patient to continue with the every 10 to 12 weeks with steroid injections.  Patient is on gabapentin for the back but seems to be doing better with the back.  Discussed icing regimen and home exercises, discussed other over-the-counter medications that could be helpful.  Follow-up again in 4 to 8 weeks  Update 12/12/2020 Gail Baker is a 64 y.o. female coming in with complaint of bilateral knee pain. Patient states that her knee pain has been increasing recently. Feels like her shoe wear affects her knee pain.  Patient has noticed if she wears better shoe she seems to do better.  Discussed icing regimen and home exercises.  Has been doing them occasionally.  Knee pain has gotten worse that is starting to affect daily activities on a more regular basis.     Past Medical History:  Diagnosis Date  . Asthma   . GERD (gastroesophageal reflux disease) 04/01/2019  . H. pylori infection   . History of UTI   . Hyperlipidemia   . Hypertension   . Hyperthyroidism   . Normocytic anemia 05/10/2020  . Pes anserinus bursitis of both knees 07/29/2018  . Tubular adenoma of colon    Past Surgical History:  Procedure Laterality Date  . BREAST BIOPSY Right 08/15/2017    FIBROADENOMATOID CHANGE   . CESAREAN SECTION     Social History   Socioeconomic History  . Marital status: Single    Spouse name: Not on file  . Number of children: 1  . Years of education: Not on file  . Highest education level: Not on file  Occupational History  . Not on file  Tobacco Use  . Smoking status: Never Smoker  . Smokeless tobacco: Never Used  Vaping Use  . Vaping Use: Never used  Substance and Sexual Activity  . Alcohol use: Baker    Alcohol/week: 0.0 standard drinks  . Drug use: Baker  . Sexual activity: Not on file  Other Topics Concern  . 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    Social Determinants of Health   Financial Resource Strain: Not on file  Food Insecurity: Not on file  Transportation Needs: Not on file  Physical Activity: Not on file  Stress: Not on file  Social Connections: Not on file   Baker Known Allergies Family History  Problem Relation Age of Onset  . Hypertension Mother   . Hypertension Father   . Diabetes Father   . Colon cancer Neg Hx   . Colon polyps Neg Hx   . Pancreatic cancer Neg Hx   . Rectal cancer Neg Hx   . Stomach cancer Neg Hx   . Breast cancer Neg Hx  Current Outpatient Medications (Cardiovascular):  .  amLODipine (NORVASC) 10 MG tablet, Take 1 tablet (10 mg total) by mouth daily. .  carvedilol (COREG) 12.5 MG tablet, TAKE ONE TABLET BY MOUTH TWICE A DAY WITH A MEAL .  hydrochlorothiazide (HYDRODIURIL) 25 MG tablet, TAKE ONE TABLET BY MOUTH DAILY .  rosuvastatin (CRESTOR) 10 MG tablet, TAKE ONE TABLET BY MOUTH DAILY   Current Outpatient Medications (Analgesics):  .  aspirin EC 81 MG tablet, Take 81 mg by mouth daily. .  traMADol (ULTRAM) 50 MG tablet, Take 1 tablet (50 mg total) by mouth every 12 (twelve) hours as needed.   Current Outpatient Medications (Other):  Marland Kitchen  Cholecalciferol (VITAMIN D3) 125 MCG (5000 UT) TABS,  Take by mouth. Pt takes 2 daily .  Diclofenac Sodium (PENNSAID) 2 % SOLN, Place 2 g onto the skin 2 (two) times daily. Marland Kitchen  dicyclomine (BENTYL) 10 MG capsule, TAKE ONE CAPSULE BY MOUTH THREE TIMES A DAY AS NEEDED FOR CRAMPS .  gabapentin (NEURONTIN) 100 MG capsule, Take 2 capsules (200 mg total) by mouth at bedtime. .  gabapentin (NEURONTIN) 300 MG capsule, Take 1 capsule (300 mg total) by mouth every evening. .  Probiotic Product (DIGESTIVE ADVANTAGE PO), Take by mouth daily.   Reviewed prior external information including notes and imaging from  primary care provider As well as notes that were available from care everywhere and other healthcare systems.  Past medical history, social, surgical and family history all reviewed in electronic medical record.  Baker pertanent information unless stated regarding to the chief complaint.   Review of Systems:  Baker headache, visual changes, nausea, vomiting, diarrhea, constipation, dizziness, abdominal pain, skin rash, fevers, chills, night sweats, weight loss, swollen lymph nodes, body aches, joint swelling, chest pain, shortness of breath, mood changes. POSITIVE muscle aches  Objective  Blood pressure 122/80, pulse 60, height 5\' 1"  (1.549 m), weight 189 lb (85.7 kg), SpO2 98 %.   General: Baker apparent distress alert and oriented x3 mood and affect normal, dressed appropriately.  HEENT: Pupils equal, extraocular movements intact  Respiratory: Patient's speak in full sentences and does not appear short of breath  Cardiovascular: Baker lower extremity edema, non tender, Baker erythema  Knee: Bilateral valgus deformity noted. Large thigh to calf ratio.  Tender to palpation over medial and PF joint line.  ROM lacks last 5 degrees of extension of the last 10 degrees of flexion. instability with valgus force.  painful patellar compression. Patellar glide with moderate crepitus. Patellar and quadriceps tendons unremarkable. Hamstring and quadriceps strength is  normal.  After informed written and verbal consent, patient was seated on exam table. Right knee was prepped with alcohol swab and utilizing anterolateral approach, patient's right knee space was injected with 4:1  marcaine 0.5%: Kenalog 40mg /dL. Patient tolerated the procedure well without immediate complications.  After informed written and verbal consent, patient was seated on exam table. Left knee was prepped with alcohol swab and utilizing anterolateral approach, patient's left knee space was injected with 4:1  marcaine 0.5%: Kenalog 40mg /dL. Patient tolerated the procedure well without immediate complications.   Impression and Recommendations:     The above documentation has been reviewed and is accurate and complete Lyndal Pulley, DO

## 2020-12-12 NOTE — Assessment & Plan Note (Signed)
Bilateral injections given today.  Chronic problem with worsening symptoms.  Discussed with patient in great length.  Patient feels though that the injections have been more helpful.  Does not want to do the viscosupplementation again.  Encourage weight loss.  Continue the gabapentin for the low back pain.  Follow-up with me again in 3 months

## 2020-12-12 NOTE — Patient Instructions (Signed)
Good to see you again!  You received bilateral knee injections today. Call or go to the ER if you develop a large red swollen joint with extreme pain or oozing puss.   See me again in 3 months.

## 2020-12-19 ENCOUNTER — Telehealth: Payer: Managed Care, Other (non HMO) | Admitting: Family Medicine

## 2021-01-02 ENCOUNTER — Other Ambulatory Visit: Payer: Self-pay

## 2021-01-02 ENCOUNTER — Telehealth (INDEPENDENT_AMBULATORY_CARE_PROVIDER_SITE_OTHER): Payer: Managed Care, Other (non HMO) | Admitting: Family Medicine

## 2021-01-02 ENCOUNTER — Encounter: Payer: Self-pay | Admitting: Family Medicine

## 2021-01-02 VITALS — Ht 61.0 in | Wt 189.0 lb

## 2021-01-02 DIAGNOSIS — M810 Age-related osteoporosis without current pathological fracture: Secondary | ICD-10-CM

## 2021-01-02 HISTORY — DX: Age-related osteoporosis without current pathological fracture: M81.0

## 2021-01-02 NOTE — Progress Notes (Signed)
Virtual Visit via telephone Note  This visit type was conducted due to national recommendations for restrictions regarding the COVID-19 pandemic (e.g. social distancing).  This format is felt to be most appropriate for this patient at this time.  All issues noted in this document were discussed and addressed.  No physical exam was performed (except for noted visual exam findings with Video Visits).   I connected with Gail Baker today at  4:00 PM EST by telephone and verified that I am speaking with the correct person using two identifiers. Location patient: home Location provider: work Persons participating in the virtual visit: patient, provider  I discussed the limitations, risks, security and privacy concerns of performing an evaluation and management service by telephone and the availability of in person appointments. I also discussed with the patient that there may be a patient responsible charge related to this service. The patient expressed understanding and agreed to proceed.  Interactive audio and video telecommunications were attempted between this provider and patient, however failed, due to patient having technical difficulties OR patient did not have access to video capability.  We continued and completed visit with audio only.   Reason for visit: f/u  HPI: Osteoporosis: Patient with recent diagnosis of osteoporosis.  T score of -4.5.  She currently takes vitamin D.  Not currently on any calcium supplementation.  No prior treatment for osteoporosis.   ROS: See pertinent positives and negatives per HPI.  Past Medical History:  Diagnosis Date  . Asthma   . GERD (gastroesophageal reflux disease) 04/01/2019  . H. pylori infection   . History of UTI   . Hyperlipidemia   . Hypertension   . Hyperthyroidism   . Normocytic anemia 05/10/2020  . Osteoporosis 01/02/2021  . Pes anserinus bursitis of both knees 07/29/2018  . Tubular adenoma of colon     Past Surgical History:   Procedure Laterality Date  . BREAST BIOPSY Right 08/15/2017   FIBROADENOMATOID CHANGE   . CESAREAN SECTION      Family History  Problem Relation Age of Onset  . Hypertension Mother   . Hypertension Father   . Diabetes Father   . Colon cancer Neg Hx   . Colon polyps Neg Hx   . Pancreatic cancer Neg Hx   . Rectal cancer Neg Hx   . Stomach cancer Neg Hx   . Breast cancer Neg Hx     SOCIAL HX: Non-smoker   Current Outpatient Medications:  .  amLODipine (NORVASC) 10 MG tablet, Take 1 tablet (10 mg total) by mouth daily., Disp: 90 tablet, Rfl: 1 .  aspirin EC 81 MG tablet, Take 81 mg by mouth daily., Disp: , Rfl:  .  carvedilol (COREG) 12.5 MG tablet, TAKE ONE TABLET BY MOUTH TWICE A DAY WITH A MEAL, Disp: 180 tablet, Rfl: 1 .  Cholecalciferol (VITAMIN D3) 125 MCG (5000 UT) TABS, Take by mouth. Pt takes 2 daily, Disp: , Rfl:  .  gabapentin (NEURONTIN) 100 MG capsule, Take 2 capsules (200 mg total) by mouth at bedtime., Disp: 180 capsule, Rfl: 0 .  gabapentin (NEURONTIN) 300 MG capsule, Take 1 capsule (300 mg total) by mouth every evening., Disp: 30 capsule, Rfl: 0 .  hydrochlorothiazide (HYDRODIURIL) 25 MG tablet, TAKE ONE TABLET BY MOUTH DAILY, Disp: 90 tablet, Rfl: 0 .  Probiotic Product (DIGESTIVE ADVANTAGE PO), Take by mouth daily., Disp: , Rfl:  .  rosuvastatin (CRESTOR) 10 MG tablet, TAKE ONE TABLET BY MOUTH DAILY, Disp: 90 tablet, Rfl: 3 .  traMADol (ULTRAM) 50 MG tablet, Take 1 tablet (50 mg total) by mouth every 12 (twelve) hours as needed., Disp: 30 tablet, Rfl: 0 .  Diclofenac Sodium (PENNSAID) 2 % SOLN, Place 2 g onto the skin 2 (two) times daily. (Patient not taking: Reported on 01/02/2021), Disp: 112 g, Rfl: 3 .  dicyclomine (BENTYL) 10 MG capsule, TAKE ONE CAPSULE BY MOUTH THREE TIMES A DAY AS NEEDED FOR CRAMPS (Patient not taking: Reported on 01/02/2021), Disp: 90 capsule, Rfl: 1  EXAM: This was a telephone visit and thus no physical exam was completed.  ASSESSMENT AND  PLAN:  Discussed the following assessment and plan:  Problem List Items Addressed This Visit    Osteoporosis - Primary    Discussed diagnosis.  Discussed risk of fracture.  Discussed severity of her osteoporosis.  Recommended Forteo.  Advised that we would need to check lab work as outlined below prior to starting on this.  She will continue her vitamin D supplement.  Discussed possible side effects of GI upset as well as elevated calcium and myalgias and arthralgias.  Discussed that if she develops any other side effects or those side effects she should let us know.      Relevant Orders   Vitamin D (25 hydroxy)   PTH, Intact and Calcium       I discussed the assessment and treatment plan with the patient. The patient was provided an opportunity to ask questions and all were answered. The patient agreed with the plan and demonstrated an understanding of the instructions.   The patient was advised to call back or seek an in-person evaluation if the symptoms worsen or if the condition fails to improve as anticipated.  I provided 6 minutes of non-face-to-face time during this encounter.   Tommi Rumps, MD

## 2021-01-02 NOTE — Assessment & Plan Note (Signed)
Discussed diagnosis.  Discussed risk of fracture.  Discussed severity of her osteoporosis.  Recommended Forteo.  Advised that we would need to check lab work as outlined below prior to starting on this.  She will continue her vitamin D supplement.  Discussed possible side effects of GI upset as well as elevated calcium and myalgias and arthralgias.  Discussed that if she develops any other side effects or those side effects she should let us know.

## 2021-01-04 ENCOUNTER — Telehealth: Payer: Managed Care, Other (non HMO) | Admitting: Family Medicine

## 2021-01-08 ENCOUNTER — Other Ambulatory Visit: Payer: Self-pay | Admitting: Internal Medicine

## 2021-02-21 ENCOUNTER — Other Ambulatory Visit: Payer: Self-pay | Admitting: Family Medicine

## 2021-02-21 DIAGNOSIS — I10 Essential (primary) hypertension: Secondary | ICD-10-CM

## 2021-03-06 ENCOUNTER — Ambulatory Visit: Payer: Managed Care, Other (non HMO) | Admitting: Family Medicine

## 2021-03-09 NOTE — Progress Notes (Signed)
[facility] [place] Phone: [phone] Subjective:   Gail Baker, am serving as a scribe for Dr. Hulan Saas. This visit occurred during the SARS-CoV-2 public health emergency.  Safety protocols were in place, including screening questions prior to the visit, additional usage of staff PPE, and extensive cleaning of exam room while observing appropriate contact time as indicated for disinfecting solutions.   I'm seeing this patient by the request  of:  Gail Haven, MD  CC: Bilateral knee pain follow-up  [id number]   09/06/2020 Chronic problem with exacerbation.  He did not respond well to the viscosupplementation.  At this time I would encourage patient to continue with the every 10 to 12 weeks with steroid injections.  Patient is on gabapentin for the back but seems to be doing better with the back.  Discussed icing regimen and home exercises, discussed other over-the-counter medications that could be helpful.  Follow-up again in 4 to 8 weeks  Update 03/09/2021 Gail Baker is a 65 y.o. female coming in with complaint of bilateral knee pain.  Last injections in the knees were in December 2021.  Patient did not respond well to viscosupplementation she feels.  Patient states that her pain is not as bad as it once was but has been increasing lately. She has good and bad days and feels that proper foot wear helps her knee pain.    Patient does have x-rays from 2020 showing that patient had moderate oa mostly in the medial compartment  Past Medical History:  Diagnosis Date  . Asthma   . GERD (gastroesophageal reflux disease) 04/01/2019  . H. pylori infection   . History of UTI   . Hyperlipidemia   . Hypertension   . Hyperthyroidism   . Normocytic anemia 05/10/2020  . Osteoporosis 01/02/2021  . Pes anserinus bursitis of both knees 07/29/2018  . Tubular adenoma of colon    Past Surgical History:  Procedure  Laterality Date  . BREAST BIOPSY Right 08/15/2017   FIBROADENOMATOID CHANGE   . CESAREAN SECTION     Social History   Socioeconomic History  . Marital status: Single    Spouse name: Not on file  . Number of children: 1  . Years of education: Not on file  . Highest education level: Not on file  Occupational History  . Not on file  Tobacco Use  . Smoking status: Never Smoker  . Smokeless tobacco: Never Used  Vaping Use  . Vaping Use: Never used  Substance and Sexual Activity  . Alcohol use: Baker    Alcohol/week: 0.0 standard drinks  . Drug use: Baker  . Sexual activity: Not on file  Other Topics Concern  . Not on file  Social History Narrative   Lives in [place]; Doctor, hospital at [employer]; 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    Social Determinants of Health   Financial Resource Strain: Not on file  Food Insecurity: Not on file  Transportation Needs: Not on file  Physical Activity: Not on file  Stress: Not on file  Social Connections: Not on file   Baker Known Allergies Family History  Problem Relation Age of Onset  . Hypertension Mother   . Hypertension Father   . Diabetes Father   . Colon cancer Neg Hx   . Colon polyps Neg Hx   . Pancreatic cancer Neg Hx   . Rectal cancer Neg Hx   . Stomach cancer Neg Hx   .  Breast cancer Neg Hx      Current Outpatient Medications (Cardiovascular):  .  amLODipine (NORVASC) 10 MG tablet, TAKE ONE TABLET BY MOUTH DAILY .  carvedilol (COREG) 12.5 MG tablet, TAKE ONE TABLET BY MOUTH TWICE A DAY WITH A MEAL .  hydrochlorothiazide (HYDRODIURIL) 25 MG tablet, TAKE ONE TABLET BY MOUTH DAILY .  rosuvastatin (CRESTOR) 10 MG tablet, TAKE ONE TABLET BY MOUTH DAILY   Current Outpatient Medications (Analgesics):  .  aspirin EC 81 MG tablet, Take 81 mg by mouth daily. .  traMADol (ULTRAM) 50 MG tablet, Take 1 tablet (50 mg total) by mouth every 12 (twelve) hours as needed.   Current Outpatient Medications (Other):  Marland Kitchen   Cholecalciferol (VITAMIN D3) 125 MCG (5000 UT) TABS, Take by mouth. Pt takes 2 daily .  Diclofenac Sodium (PENNSAID) 2 % SOLN, Place 2 g onto the skin 2 (two) times daily. (Patient not taking: Reported on 01/02/2021) .  dicyclomine (BENTYL) 10 MG capsule, TAKE ONE CAPSULE BY MOUTH THREE TIMES A DAY AS NEEDED FOR CRAMPS (Patient not taking: Reported on 01/02/2021) .  gabapentin (NEURONTIN) 100 MG capsule, Take 2 capsules (200 mg total) by mouth at bedtime. .  gabapentin (NEURONTIN) 300 MG capsule, Take 1 capsule (300 mg total) by mouth every evening. .  Probiotic Product (DIGESTIVE ADVANTAGE PO), Take by mouth daily.   Reviewed prior external information including notes and imaging from  primary care provider As well as notes that were available from care everywhere and other healthcare systems.  Past medical history, social, surgical and family history all reviewed in electronic medical record.  Baker pertanent information unless stated regarding to the chief complaint.   Review of Systems:  Baker headache, visual changes, nausea, vomiting, diarrhea, constipation, dizziness, abdominal pain, skin rash, fevers, chills, night sweats, weight loss, swollen lymph nodes,  chest pain, shortness of breath, mood changes. POSITIVE muscle aches, body aches, joint swelling  Objective  There were Baker vitals taken for this visit.   General: Baker apparent distress alert and oriented x3 mood and affect normal, dressed appropriately.  HEENT: Pupils equal, extraocular movements intact  Respiratory: Patient's speak in full sentences and does not appear short of breath  Cardiovascular: trace lower extremity edema, non tender, Baker erythema  Gait antalgic MSK:   Knee: Bilateral valgus deformity noted. Large thigh to calf ratio.  Tender to palpation over medial and PF joint line.  ROM full in flexion and extension and lower leg rotation. instability with valgus force.  painful patellar compression. Patellar glide with  moderate crepitus. Patellar and quadriceps tendons unremarkable. Hamstring and quadriceps strength is normal.  After informed written and verbal consent, patient was seated on exam table. Right knee was prepped with alcohol swab and utilizing anterolateral approach, patient's right knee space was injected with 4:1  marcaine 0.5%: Kenalog 40mg /dL. Patient tolerated the procedure well without immediate complications.  After informed written and verbal consent, patient was seated on exam table. Left knee was prepped with alcohol swab and utilizing anterolateral approach, patient's left knee space was injected with 4:1  marcaine 0.5%: Kenalog 40mg /dL. Patient tolerated the procedure well without immediate complications.    Impression and Recommendations:     The above documentation has been reviewed and is accurate and complete Lyndal Pulley, DO

## 2021-03-14 ENCOUNTER — Encounter: Payer: Self-pay | Admitting: Family Medicine

## 2021-03-14 ENCOUNTER — Ambulatory Visit (INDEPENDENT_AMBULATORY_CARE_PROVIDER_SITE_OTHER): Payer: Managed Care, Other (non HMO)

## 2021-03-14 ENCOUNTER — Ambulatory Visit (INDEPENDENT_AMBULATORY_CARE_PROVIDER_SITE_OTHER): Payer: Managed Care, Other (non HMO) | Admitting: Family Medicine

## 2021-03-14 ENCOUNTER — Other Ambulatory Visit: Payer: Self-pay

## 2021-03-14 DIAGNOSIS — M170 Bilateral primary osteoarthritis of knee: Secondary | ICD-10-CM | POA: Diagnosis not present

## 2021-03-14 NOTE — Patient Instructions (Signed)
Xray today Injected both knees See me again in 3-4 months

## 2021-03-14 NOTE — Assessment & Plan Note (Signed)
Chronic problem with exacerbation.  Patient is doing relatively well.  Patient is only taking the gabapentin intermittent for the back pain.  Does take tramadol intermittently from her primary care provider.  Given some topical anti-inflammatory which patient has responded to in the past.  Discussed weight loss.  Discussed proper shoes.  Encourage patient to continue to be active otherwise.  Follow-up with me again in 3 to 4 months.  We did discuss other treatments such as the possibility of MRIs to further evaluate but likely would need then surgical intervention with patient is not willing to do injections do seem to be helping.

## 2021-04-08 ENCOUNTER — Other Ambulatory Visit: Payer: Self-pay | Admitting: Internal Medicine

## 2021-05-02 ENCOUNTER — Other Ambulatory Visit: Payer: Self-pay

## 2021-05-04 IMAGING — DX RIGHT FOOT COMPLETE - 3+ VIEW
3 series · 3 of 3 positions shown · non-contrast
Comparison: None.

CLINICAL DATA: Injury to fifth toe 2 weeks ago with persistent
pain.

EXAM:
RIGHT FOOT COMPLETE - 3+ VIEW

[foot ap]
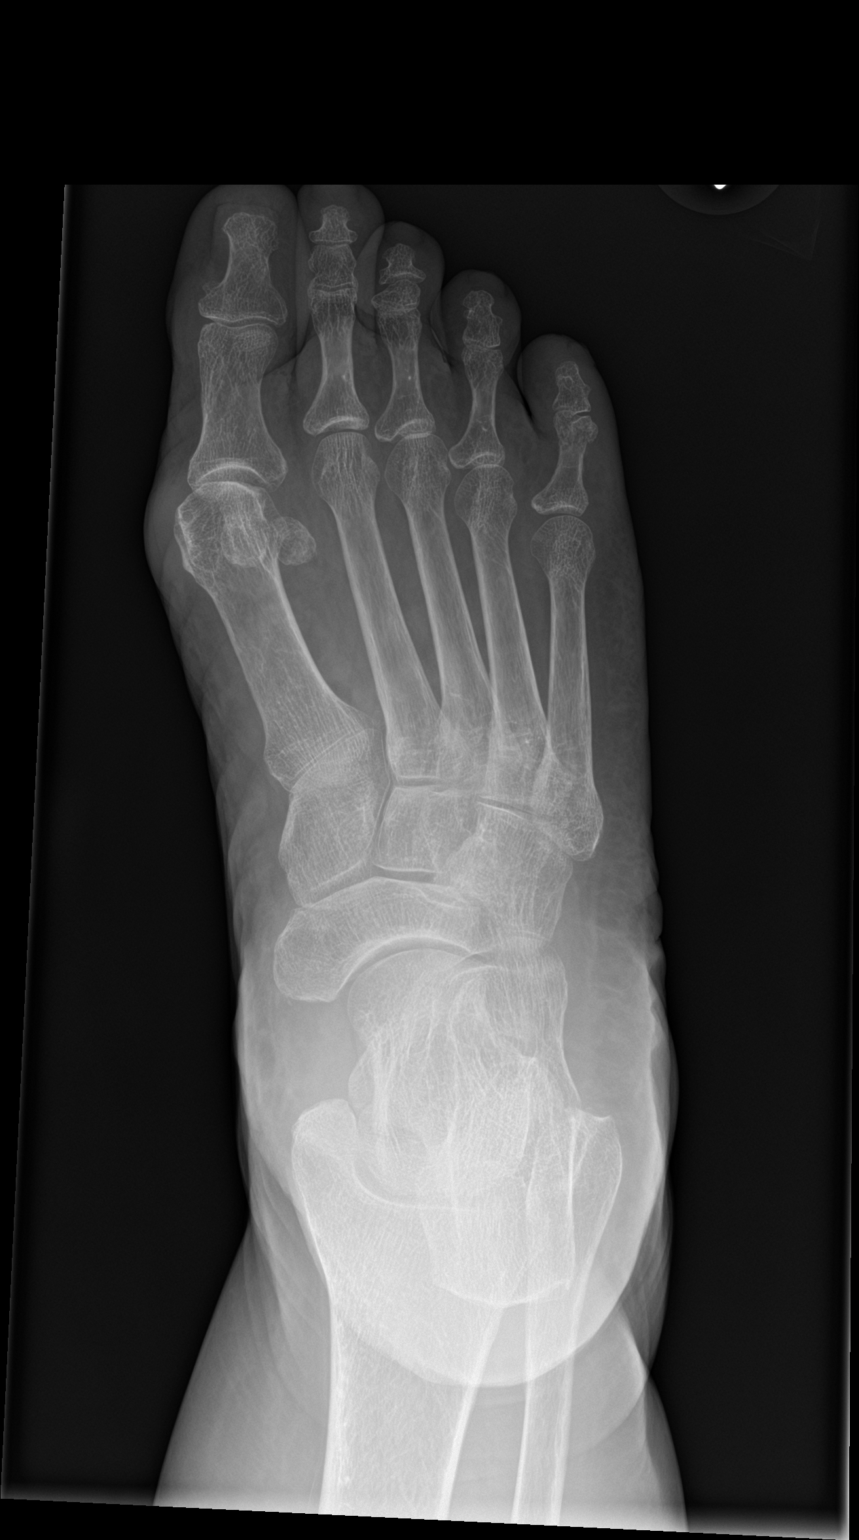

[foot obl]
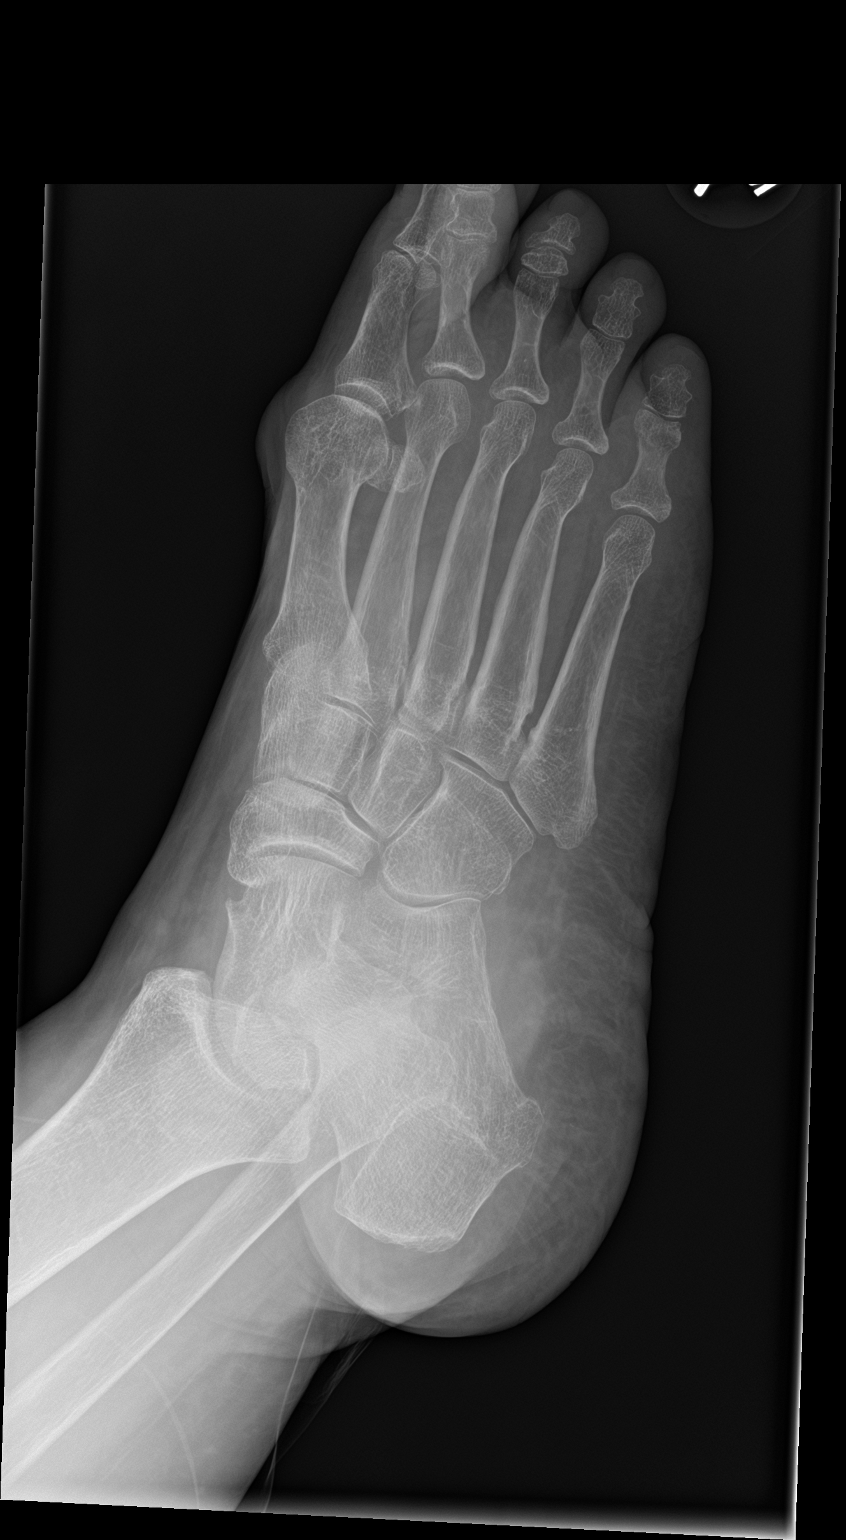

[foot lat]
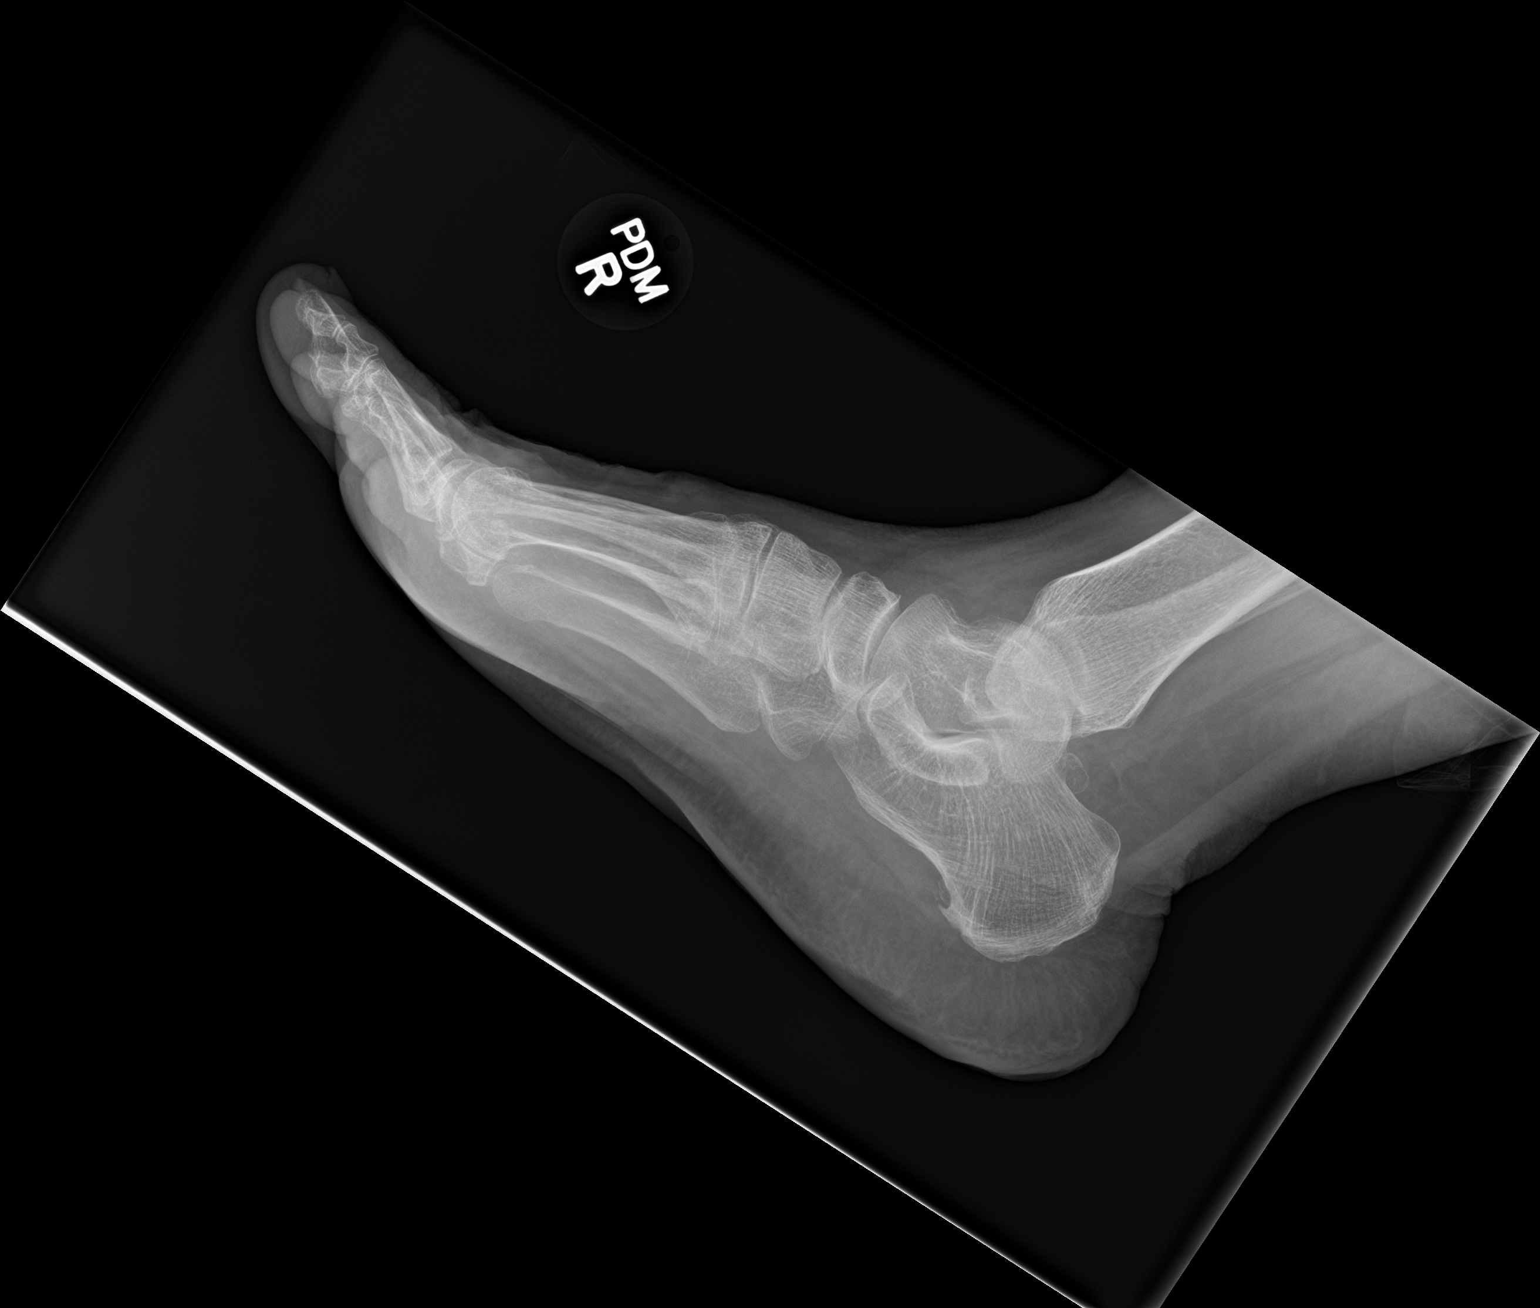

[3 of 3 positions shown; findings below may reference images not displayed]

FINDINGS: There is mild diffuse decreased bone density. Findings suggest a
subacute fracture of the distal aspect of the fifth proximal phalanx
without significant displacement seen best on the AP view. Minimal
degenerate change of the midfoot. Small inferior calcaneal spur.
IMPRESSION: Subacute fracture over the distal aspect of the fifth proximal
phalanx without significant displacement.

## 2021-05-08 ENCOUNTER — Encounter: Payer: Self-pay | Admitting: Family Medicine

## 2021-05-08 ENCOUNTER — Ambulatory Visit: Payer: Managed Care, Other (non HMO) | Admitting: Family Medicine

## 2021-05-08 ENCOUNTER — Other Ambulatory Visit: Payer: Self-pay

## 2021-05-08 VITALS — BP 140/80 | HR 81 | Temp 99.1°F | Ht 61.0 in | Wt 188.8 lb

## 2021-05-08 DIAGNOSIS — I10 Essential (primary) hypertension: Secondary | ICD-10-CM

## 2021-05-08 DIAGNOSIS — D126 Benign neoplasm of colon, unspecified: Secondary | ICD-10-CM

## 2021-05-08 DIAGNOSIS — Z23 Encounter for immunization: Secondary | ICD-10-CM

## 2021-05-08 DIAGNOSIS — K5900 Constipation, unspecified: Secondary | ICD-10-CM | POA: Insufficient documentation

## 2021-05-08 DIAGNOSIS — M810 Age-related osteoporosis without current pathological fracture: Secondary | ICD-10-CM

## 2021-05-08 DIAGNOSIS — E785 Hyperlipidemia, unspecified: Secondary | ICD-10-CM | POA: Diagnosis not present

## 2021-05-08 DIAGNOSIS — M170 Bilateral primary osteoarthritis of knee: Secondary | ICD-10-CM | POA: Diagnosis not present

## 2021-05-08 LAB — VITAMIN D 25 HYDROXY (VIT D DEFICIENCY, FRACTURES): VITD: 53.4 ng/mL (ref 30.00–100.00)

## 2021-05-08 LAB — COMPREHENSIVE METABOLIC PANEL
ALT: 11 U/L (ref 0–35)
AST: 17 U/L (ref 0–37)
Albumin: 4 g/dL (ref 3.5–5.2)
Alkaline Phosphatase: 52 U/L (ref 39–117)
BUN: 13 mg/dL (ref 6–23)
CO2: 28 mEq/L (ref 19–32)
Calcium: 9.1 mg/dL (ref 8.4–10.5)
Chloride: 107 mEq/L (ref 96–112)
Creatinine, Ser: 0.68 mg/dL (ref 0.40–1.20)
GFR: 90.85 mL/min (ref >60.00)
Glucose, Bld: 84 mg/dL (ref 70–99)
Potassium: 3.7 mEq/L (ref 3.5–5.1)
Sodium: 142 mEq/L (ref 135–145)
Total Bilirubin: 0.6 mg/dL (ref 0.2–1.2)
Total Protein: 7.2 g/dL (ref 6.0–8.3)

## 2021-05-08 LAB — LIPID PANEL
Cholesterol: 132 mg/dL (ref 0–200)
HDL: 59.6 mg/dL (ref >39.00)
LDL Cholesterol: 66 mg/dL (ref 0–99)
NonHDL: 72.8
Total CHOL/HDL Ratio: 2
Triglycerides: 35 mg/dL (ref 0.0–149.0)
VLDL: 7 mg/dL (ref 0.0–40.0)

## 2021-05-08 MED ORDER — TRAMADOL HCL 50 MG PO TABS: 50.0000 mg | ORAL_TABLET | Freq: Two times a day (BID) | ORAL | 0 refills | Status: DC | PRN

## 2021-05-08 NOTE — Assessment & Plan Note (Signed)
Check lab work today and then consider Forteo if her insurance approves this.

## 2021-05-08 NOTE — Progress Notes (Signed)
Tommi Rumps, MD Phone: [phone]  Gail Baker is a 65 y.o. female who presents today for f/u.  HYPERTENSION  Disease Monitoring  Home BP Monitoring typically 120/80 Chest pain- no    Dyspnea- no Medications  Compliance-  Taking amlodipine, coreg, HCTZ  Edema- no  HYPERLIPIDEMIA Symptoms Chest pain on exertion:  no    Medications: Compliance- taking crestor Right upper quadrant pain- no  Muscle aches- no  Osteoporosis: Patient never had the lab work that was ordered after her last visit.  Intermittent constipation: Patient notes she has not had any issues with this recently.  She has increased her fruit and vegetable intake and that has made a big difference.  Chronic bilateral knee pain: Patient follows with sports medicine for this.  She has been getting injections periodically for this.  She takes tramadol occasionally to help with her pain.  Does make her a little drowsy though she does not drive when taking this.  No alcohol intake.  Social History   Tobacco Use  Smoking Status Never Smoker  Smokeless Tobacco Never Used    Current Outpatient Medications on File Prior to Visit  Medication Sig Dispense Refill  . amLODipine (NORVASC) 10 MG tablet TAKE ONE TABLET BY MOUTH DAILY 90 tablet 1  . aspirin EC 81 MG tablet Take 81 mg by mouth daily.    . carvedilol (COREG) 12.5 MG tablet TAKE ONE TABLET BY MOUTH TWICE A DAY WITH A MEAL 180 tablet 1  . Cholecalciferol (VITAMIN D3) 125 MCG (5000 UT) TABS Take by mouth. Pt takes 2 daily    . Diclofenac Sodium (PENNSAID) 2 % SOLN Place 2 g onto the skin 2 (two) times daily. 112 g 3  . dicyclomine (BENTYL) 10 MG capsule TAKE ONE CAPSULE BY MOUTH THREE TIMES A DAY AS NEEDED FOR CRAMPS 90 capsule 1  . gabapentin (NEURONTIN) 100 MG capsule Take 2 capsules (200 mg total) by mouth at bedtime. 180 capsule 0  . gabapentin (NEURONTIN) 300 MG capsule Take 1 capsule (300 mg total) by mouth every evening. 30 capsule 0  .  hydrochlorothiazide (HYDRODIURIL) 25 MG tablet TAKE ONE TABLET BY MOUTH DAILY 90 tablet 0  . Probiotic Product (DIGESTIVE ADVANTAGE PO) Take by mouth daily.    . rosuvastatin (CRESTOR) 10 MG tablet TAKE ONE TABLET BY MOUTH DAILY 90 tablet 3   No current facility-administered medications on file prior to visit.     ROS see history of present illness  Objective  Physical Exam Vitals:   05/08/21 0933 05/08/21 0950  BP: (!) 150/90 140/80  Pulse: 81   Temp: 99.1 F (37.3 C)   SpO2: 98%     BP Readings from Last 3 Encounters:  05/08/21 140/80  03/14/21 124/76  12/12/20 122/80   Wt Readings from Last 3 Encounters:  05/08/21 188 lb 12.8 oz (85.6 kg)  03/14/21 189 lb (85.7 kg)  01/02/21 189 lb (85.7 kg)    Physical Exam Constitutional:      General: She is not in acute distress.    Appearance: She is not diaphoretic.  Cardiovascular:     Rate and Rhythm: Normal rate and regular rhythm.     Heart sounds: Normal heart sounds.  Pulmonary:     Effort: Pulmonary effort is normal.     Breath sounds: Normal breath sounds.  Musculoskeletal:     Right lower leg: No edema.     Left lower leg: No edema.  Skin:    General: Skin is warm  and dry.  Neurological:     Mental Status: She is alert.      Assessment/Plan: Please see individual problem list.  Problem List Items Addressed This Visit    Hypertension - Primary    Well-controlled at home.  She will continue to monitor this at home and if it trends up she will let us know.  She will continue amlodipine 10 mg once daily, carvedilol 12.5 mg twice daily, and HCTZ 25 mg daily.  Check labs.      Relevant Orders   Comp Met (CMET)   Hyperlipidemia    Continue Crestor 10 mg once daily.  Check lipid panel.      Relevant Orders   Lipid panel   Degenerative arthritis of knee, bilateral    Stable at this time.  Controlled subs database reviewed.  Tramadol refill provided.  Advised to monitor for excessive drowsiness and if this  occurs to let us know.  Advised to not drive if she is drowsy while taking this.      Relevant Medications   traMADol (ULTRAM) 50 MG tablet   Tubular adenoma of colon    I discussed that the patient is due for colonoscopy.  She continues to decline this given financial reasons.  I discussed that there is a potential for missing a significant lesion without having the follow-up colonoscopy.  She understands this and she will let us know when she is ready to see GI.      Osteoporosis    Check lab work today and then consider Forteo if her insurance approves this.      Relevant Orders   Vitamin D (25 hydroxy)   PTH, Intact and Calcium   Constipation    This is an intermittent issue that is well controlled at this time.  She will continue with lots of fruits and vegetables in her diet.  If it becomes bothersome she will let us know.       Other Visit Diagnoses    Need for tetanus booster       Relevant Orders   Td : Tetanus/diphtheria >7yo Preservative  free (Completed)      Return in about 6 months (around 11/08/2021) for Hypertension.  This visit occurred during the SARS-CoV-2 public health emergency.  Safety protocols were in place, including screening questions prior to the visit, additional usage of staff PPE, and extensive cleaning of exam room while observing appropriate contact time as indicated for disinfecting solutions.    Tommi Rumps, MD [place]

## 2021-05-08 NOTE — Patient Instructions (Signed)
Nice to see you. We will check lab work today. Please let me know when you would like to see GI. Please continue to monitor your blood pressure and if it trends up please let us know.

## 2021-05-08 NOTE — Assessment & Plan Note (Signed)
I discussed that the patient is due for colonoscopy.  She continues to decline this given financial reasons.  I discussed that there is a potential for missing a significant lesion without having the follow-up colonoscopy.  She understands this and she will let us know when she is ready to see GI.

## 2021-05-08 NOTE — Assessment & Plan Note (Signed)
Stable at this time.  Controlled subs database reviewed.  Tramadol refill provided.  Advised to monitor for excessive drowsiness and if this occurs to let us know.  Advised to not drive if she is drowsy while taking this.

## 2021-05-08 NOTE — Assessment & Plan Note (Signed)
This is an intermittent issue that is well controlled at this time.  She will continue with lots of fruits and vegetables in her diet.  If it becomes bothersome she will let us know.

## 2021-05-08 NOTE — Assessment & Plan Note (Signed)
Continue Crestor 10 mg once daily.  Check lipid panel. 

## 2021-05-08 NOTE — Assessment & Plan Note (Signed)
Well-controlled at home.  She will continue to monitor this at home and if it trends up she will let us know.  She will continue amlodipine 10 mg once daily, carvedilol 12.5 mg twice daily, and HCTZ 25 mg daily.  Check labs.

## 2021-05-10 LAB — PTH, INTACT AND CALCIUM: PTH: 20 pg/mL (ref 16–77)

## 2021-05-23 ENCOUNTER — Other Ambulatory Visit: Payer: Self-pay | Admitting: Family Medicine

## 2021-06-28 NOTE — Progress Notes (Deleted)
Gail Baker [place] Phone: [phone] Subjective:    I'm seeing this patient by the request  of:  Gail Haven, MD  CC:   [id number]  03/14/2021 Chronic problem with exacerbation.  Patient is doing relatively well.  Patient is only taking the gabapentin intermittent for the back pain.  Does take tramadol intermittently from her primary care provider.  Given some topical anti-inflammatory which patient has responded to in the past.  Discussed weight loss.  Discussed proper shoes.  Encourage patient to continue to be active otherwise.  Follow-up with me again in 3 to 4 months.  We did discuss other treatments such as the possibility of MRIs to further evaluate but likely would need then surgical intervention with patient is not willing to do injections do seem to be helping.  Update 06/29/2021 Gail Baker is a 65 y.o. female coming in with complaint of B knee pain.   Onset-  Location Duration-  Character- Aggravating factors- Reliving factors-  Therapies tried-  Severity-     Past Medical History:  Diagnosis Date   Asthma    GERD (gastroesophageal reflux disease) 04/01/2019   H. pylori infection    History of UTI    Hyperlipidemia    Hypertension    Hyperthyroidism    Normocytic anemia 05/10/2020   Osteoporosis 01/02/2021   Pes anserinus bursitis of both knees 07/29/2018   Tubular adenoma of colon    Past Surgical History:  Procedure Laterality Date   BREAST BIOPSY Right 08/15/2017   FIBROADENOMATOID CHANGE    CESAREAN SECTION     Social History   Socioeconomic History   Marital status: Single    Spouse name: Not on file   Number of children: 1   Years of education: Not on file   Highest education level: Not on file  Occupational History   Not on file  Tobacco Use   Smoking status: Never   Smokeless tobacco: Never  Vaping Use   Vaping Use: Never used  Substance and Sexual Activity    Alcohol use: No    Alcohol/week: 0.0 standard drinks   Drug use: No   Sexual activity: Not on file  Other Topics Concern   Not on file  Social History Narrative   Lives in [place]; Doctor, hospital at [employer]; 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    Social Determinants of Health   Financial Resource Strain: Not on file  Food Insecurity: Not on file  Transportation Needs: Not on file  Physical Activity: Not on file  Stress: Not on file  Social Connections: Not on file   No Known Allergies Family History  Problem Relation Age of Onset   Hypertension Mother    Hypertension Father    Diabetes Father    Colon cancer Neg Hx    Colon polyps Neg Hx    Pancreatic cancer Neg Hx    Rectal cancer Neg Hx    Stomach cancer Neg Hx    Breast cancer Neg Hx      Current Outpatient Medications (Cardiovascular):    amLODipine (NORVASC) 10 MG tablet, TAKE ONE TABLET BY MOUTH DAILY   carvedilol (COREG) 12.5 MG tablet, TAKE ONE TABLET BY MOUTH TWICE A DAY WITH MEALS   hydrochlorothiazide (HYDRODIURIL) 25 MG tablet, TAKE ONE TABLET BY MOUTH DAILY   rosuvastatin (CRESTOR) 10 MG tablet, TAKE ONE TABLET BY MOUTH DAILY   Current Outpatient Medications (Analgesics):    aspirin  EC 81 MG tablet, Take 81 mg by mouth daily.   traMADol (ULTRAM) 50 MG tablet, Take 1 tablet (50 mg total) by mouth every 12 (twelve) hours as needed.   Current Outpatient Medications (Other):    Cholecalciferol (VITAMIN D3) 125 MCG (5000 UT) TABS, Take by mouth. Pt takes 2 daily   Diclofenac Sodium (PENNSAID) 2 % SOLN, Place 2 g onto the skin 2 (two) times daily.   dicyclomine (BENTYL) 10 MG capsule, TAKE ONE CAPSULE BY MOUTH THREE TIMES A DAY AS NEEDED FOR CRAMPS   gabapentin (NEURONTIN) 100 MG capsule, Take 2 capsules (200 mg total) by mouth at bedtime.   gabapentin (NEURONTIN) 300 MG capsule, Take 1 capsule (300 mg total) by mouth every evening.   Probiotic Product (DIGESTIVE ADVANTAGE PO), Take by mouth  daily.   Reviewed prior external information including notes and imaging from  primary care provider As well as notes that were available from care everywhere and other healthcare systems.  Past medical history, social, surgical and family history all reviewed in electronic medical record.  No pertanent information unless stated regarding to the chief complaint.   Review of Systems:  No headache, visual changes, nausea, vomiting, diarrhea, constipation, dizziness, abdominal pain, skin rash, fevers, chills, night sweats, weight loss, swollen lymph nodes, body aches, joint swelling, chest pain, shortness of breath, mood changes. POSITIVE muscle aches  Objective  There were no vitals taken for this visit.   General: No apparent distress alert and oriented x3 mood and affect normal, dressed appropriately.  HEENT: Pupils equal, extraocular movements intact  Respiratory: Patient's speak in full sentences and does not appear short of breath  Cardiovascular: No lower extremity edema, non tender, no erythema  Gait normal with good balance and coordination.  MSK:  Non tender with full range of motion and good stability and symmetric strength and tone of shoulders, elbows, wrist, hip, knee and ankles bilaterally.     Impression and Recommendations:     The above documentation has been reviewed and is accurate and complete Gail Baker

## 2021-07-04 ENCOUNTER — Ambulatory Visit: Payer: Managed Care, Other (non HMO) | Admitting: Family Medicine

## 2021-07-13 ENCOUNTER — Other Ambulatory Visit: Payer: Self-pay | Admitting: Internal Medicine

## 2021-08-08 NOTE — Progress Notes (Signed)
[name] [place] Phone: [phone] Subjective:   Rito Ehrlich, am serving as a scribe for Dr. Hulan Saas.  I'm seeing this patient by the request  of:  Leone Haven, MD  CC: bilateral knee pain   [id number]  03/14/2021 Chronic problem with exacerbation.  Patient is doing relatively well.  Patient is only taking the gabapentin intermittent for the back pain.  Does take tramadol intermittently from her primary care provider.  Given some topical anti-inflammatory which patient has responded to in the past.  Discussed weight loss.  Discussed proper shoes.  Encourage patient to continue to be active otherwise.  Follow-up with me again in 3 to 4 months.  We did discuss other treatments such as the possibility of MRIs to further evaluate but likely would need then surgical intervention with patient is not willing to do injections do seem to be helping.  Update 08/08/2021 VERMA JASSO is a 65 y.o. female coming in with complaint of B knee pain. Patient states that her knees are slightly less painful than last time and no new complaints         Past Medical History:  Diagnosis Date   Asthma    GERD (gastroesophageal reflux disease) 04/01/2019   H. pylori infection    History of UTI    Hyperlipidemia    Hypertension    Hyperthyroidism    Normocytic anemia 05/10/2020   Osteoporosis 01/02/2021   Pes anserinus bursitis of both knees 07/29/2018   Tubular adenoma of colon    Past Surgical History:  Procedure Laterality Date   BREAST BIOPSY Right 08/15/2017   FIBROADENOMATOID CHANGE    CESAREAN SECTION     Social History   Socioeconomic History   Marital status: Single    Spouse name: Not on file   Number of children: 1   Years of education: Not on file   Highest education level: Not on file  Occupational History   Not on file  Tobacco Use   Smoking status: Never   Smokeless tobacco: Never  Vaping  Use   Vaping Use: Never used  Substance and Sexual Activity   Alcohol use: No    Alcohol/week: 0.0 standard drinks   Drug use: No   Sexual activity: Not on file  Other Topics Concern   Not on file  Social History Narrative   Lives in [place]; Doctor, hospital at [employer]; 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    Social Determinants of Health   Financial Resource Strain: Not on file  Food Insecurity: Not on file  Transportation Needs: Not on file  Physical Activity: Not on file  Stress: Not on file  Social Connections: Not on file   No Known Allergies Family History  Problem Relation Age of Onset   Hypertension Mother    Hypertension Father    Diabetes Father    Colon cancer Neg Hx    Colon polyps Neg Hx    Pancreatic cancer Neg Hx    Rectal cancer Neg Hx    Stomach cancer Neg Hx    Breast cancer Neg Hx      Current Outpatient Medications (Cardiovascular):    amLODipine (NORVASC) 10 MG tablet, TAKE ONE TABLET BY MOUTH DAILY   carvedilol (COREG) 12.5 MG tablet, TAKE ONE TABLET BY MOUTH TWICE A DAY WITH MEALS   hydrochlorothiazide (HYDRODIURIL) 25 MG tablet, TAKE ONE TABLET BY MOUTH DAILY   rosuvastatin (CRESTOR) 10  MG tablet, TAKE ONE TABLET BY MOUTH DAILY   Current Outpatient Medications (Analgesics):    aspirin EC 81 MG tablet, Take 81 mg by mouth daily.   traMADol (ULTRAM) 50 MG tablet, Take 1 tablet (50 mg total) by mouth every 12 (twelve) hours as needed.   Current Outpatient Medications (Other):    Cholecalciferol (VITAMIN D3) 125 MCG (5000 UT) TABS, Take by mouth. Pt takes 2 daily   Diclofenac Sodium (PENNSAID) 2 % SOLN, Place 2 g onto the skin 2 (two) times daily.   dicyclomine (BENTYL) 10 MG capsule, TAKE ONE CAPSULE BY MOUTH THREE TIMES A DAY AS NEEDED FOR CRAMPS   gabapentin (NEURONTIN) 100 MG capsule, Take 2 capsules (200 mg total) by mouth at bedtime.   gabapentin (NEURONTIN) 300 MG capsule, Take 1 capsule (300 mg total) by mouth every evening.    Probiotic Product (DIGESTIVE ADVANTAGE PO), Take by mouth daily.   Reviewed prior external information including notes and imaging from  primary care provider As well as notes that were available from care everywhere and other healthcare systems.  Past medical history, social, surgical and family history all reviewed in electronic medical record.  No pertanent information unless stated regarding to the chief complaint.   Review of Systems:  No headache, visual changes, nausea, vomiting, diarrhea, constipation, dizziness, abdominal pain, skin rash, fevers, chills, night sweats, weight loss, swollen lymph nodes, body aches, joint swelling, chest pain, shortness of breath, mood changes. POSITIVE muscle aches  Objective  Blood pressure 130/82, pulse 67, height '5\' 1"'$  (1.549 m), weight 187 lb (84.8 kg), SpO2 98 %.   General: No apparent distress alert and oriented x3 mood and affect normal, dressed appropriately.  HEENT: Pupils equal, extraocular movements intact  Respiratory: Patient's speak in full sentences and does not appear short of breath  Cardiovascular: No lower extremity edema, non tender, no erythema  Gait normal with good balance and coordination.  MSK:  \Arthritic changes of the knees noted.  Patient has some mild instability but doing relatively well.  Patient still has some tightness noted of the pes anserine area bilaterally.    Impression and Recommendations:     The above documentation has been reviewed and is accurate and complete Lyndal Pulley, DO

## 2021-08-09 ENCOUNTER — Encounter: Payer: Self-pay | Admitting: Family Medicine

## 2021-08-09 ENCOUNTER — Ambulatory Visit: Payer: Managed Care, Other (non HMO) | Admitting: Family Medicine

## 2021-08-09 ENCOUNTER — Other Ambulatory Visit: Payer: Self-pay

## 2021-08-09 DIAGNOSIS — M170 Bilateral primary osteoarthritis of knee: Secondary | ICD-10-CM

## 2021-08-09 DIAGNOSIS — M7052 Other bursitis of knee, left knee: Secondary | ICD-10-CM | POA: Diagnosis not present

## 2021-08-09 DIAGNOSIS — M7051 Other bursitis of knee, right knee: Secondary | ICD-10-CM | POA: Diagnosis not present

## 2021-08-09 NOTE — Patient Instructions (Addendum)
Good to see you  So happy you are doing well Call us if you need [place]  See me again in 2 months just in case

## 2021-08-09 NOTE — Assessment & Plan Note (Signed)
Patient does have known arthritic changes but is doing very well.  We discussed potential injections but patient declined.  Patient does have a topical anti-inflammatories.  Can call if any worsening pain will consider injections.  Follow-up with me again in 2 to 3 months

## 2021-08-09 NOTE — Assessment & Plan Note (Signed)
Patient has had difficulty with this as well.  We will continue to monitor.  Follow-up with me again in 2-3

## 2021-09-11 ENCOUNTER — Other Ambulatory Visit: Payer: Self-pay | Admitting: Family Medicine

## 2021-09-11 DIAGNOSIS — I10 Essential (primary) hypertension: Secondary | ICD-10-CM

## 2021-10-08 ENCOUNTER — Other Ambulatory Visit: Payer: Self-pay | Admitting: Internal Medicine

## 2021-10-10 NOTE — Progress Notes (Signed)
[facility] [address] [place] Phone: [phone] Subjective:   [name], am serving as a scribe for Dr. Hulan Saas. This visit occurred during the SARS-CoV-2 public health emergency.  Safety protocols were in place, including screening questions prior to the visit, additional usage of staff PPE, and extensive cleaning of exam room while observing appropriate contact time as indicated for disinfecting solutions.   I'm seeing this patient by the request  of:  Leone Haven, MD  CC: Knee pain follow-up  [id number]  08/09/2021 Patient does have known arthritic changes but is doing very well.  We discussed potential injections but patient declined.  Patient does have a topical anti-inflammatories.  Can call if any worsening pain will consider injections.  Follow-up with me again in 2 to 3 months  Patient has had difficulty with this as well.  We will continue to monitor.  Follow-up with me again in 2-3  Update 10/11/2021 Mayanna R Cowman is a 65 y.o. female coming in with complaint of B knee pain. Patient states pain in both knees. 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  Patient is having increasing difficulty with home exercises as well as doing ultimately falling asleep secondary to the has responded well to the injections and last one was 7 months ago.       Past Medical History:  Diagnosis Date   Asthma    GERD (gastroesophageal reflux disease) 04/01/2019   H. pylori infection    History of UTI    Hyperlipidemia    Hypertension    Hyperthyroidism    Normocytic anemia 05/10/2020   Osteoporosis 01/02/2021   Pes anserinus bursitis of both knees 07/29/2018   Tubular adenoma of colon    Past Surgical History:  Procedure Laterality Date   BREAST BIOPSY Right 08/15/2017   FIBROADENOMATOID CHANGE    CESAREAN SECTION     Social History   Socioeconomic  History   Marital status: Single    Spouse name: Not on file   Number of children: 1   Years of education: Not on file   Highest education level: Not on file  Occupational History   Not on file  Tobacco Use   Smoking status: Never   Smokeless tobacco: Never  Vaping Use   Vaping Use: Never used  Substance and Sexual Activity   Alcohol use: No    Alcohol/week: 0.0 standard drinks   Drug use: No   Sexual activity: Not on file  Other Topics Concern   Not on file  Social History Narrative   Lives in [place]; Doctor, hospital at [employer]; 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    Social Determinants of Health   Financial Resource Strain: Not on file  Food Insecurity: Not on file  Transportation Needs: Not on file  Physical Activity: Not on file  Stress: Not on file  Social Connections: Not on file   No Known Allergies Family History  Problem Relation Age of Onset   Hypertension Mother    Hypertension Father    Diabetes Father    Colon cancer Neg Hx    Colon polyps Neg Hx    Pancreatic cancer Neg Hx    Rectal cancer Neg Hx    Stomach cancer Neg Hx    Breast cancer Neg Hx      Current Outpatient Medications (Cardiovascular):    amLODipine (NORVASC) 10 MG tablet, TAKE ONE TABLET  BY MOUTH DAILY   carvedilol (COREG) 12.5 MG tablet, TAKE ONE TABLET BY MOUTH TWICE A DAY WITH MEALS   hydrochlorothiazide (HYDRODIURIL) 25 MG tablet, TAKE ONE TABLET BY MOUTH DAILY   rosuvastatin (CRESTOR) 10 MG tablet, TAKE ONE TABLET BY MOUTH DAILY   Current Outpatient Medications (Analgesics):    aspirin EC 81 MG tablet, Take 81 mg by mouth daily.   traMADol (ULTRAM) 50 MG tablet, Take 1 tablet (50 mg total) by mouth every 12 (twelve) hours as needed.   Current Outpatient Medications (Other):    Cholecalciferol (VITAMIN D3) 125 MCG (5000 UT) TABS, Take by mouth. Pt takes 2 daily   Diclofenac Sodium (PENNSAID) 2 % SOLN, Place 2 g onto the skin 2 (two) times daily.   dicyclomine  (BENTYL) 10 MG capsule, TAKE ONE CAPSULE BY MOUTH THREE TIMES A DAY AS NEEDED FOR CRAMPS   gabapentin (NEURONTIN) 100 MG capsule, Take 2 capsules (200 mg total) by mouth at bedtime.   gabapentin (NEURONTIN) 300 MG capsule, Take 1 capsule (300 mg total) by mouth every evening.   Probiotic Product (DIGESTIVE ADVANTAGE PO), Take by mouth daily.   Reviewed prior external information including notes and imaging from  primary care provider As well as notes that were available from care everywhere and other healthcare systems.  Past medical history, social, surgical and family history all reviewed in electronic medical record.  No pertanent information unless stated regarding to the chief complaint.   Review of Systems:  No headache, visual changes, nausea, vomiting, diarrhea, constipation, dizziness, abdominal pain, skin rash, fevers, chills, night sweats, weight loss, swollen lymph nodes, body aches,  chest pain, shortness of breath, mood changes. POSITIVE muscle aches, joint swelling  Objective  Blood pressure 140/80, pulse 66, height 5\' 1"  (1.549 m), weight 186 lb (84.4 kg), SpO2 98 %.   General: No apparent distress alert and oriented x3 mood and affect normal, dressed appropriately.  HEENT: Pupils equal, extraocular movements intact  Respiratory: Patient's speak in full sentences and does not appear short of breath  Cardiovascular: No lower extremity edema, non tender, no erythema  Gait n antalgic gait Knee: Bilateral valgus deformity noted. Large thigh to calf ratio.  Tender to palpation over medial and PF joint line.  ROM full in flexion and extension and lower leg rotation. instability with valgus force.  painful patellar compression. Patellar glide with moderate crepitus. Patellar and quadriceps tendons unremarkable. Hamstring and quadriceps strength is normal.  After informed written and verbal consent, patient was seated on exam table. Right knee was prepped with alcohol swab  and utilizing anterolateral approach, patient's right knee space was injected with 4:1  marcaine 0.5%: Kenalog 40mg /dL. Patient tolerated the procedure well without immediate complications.  After informed written and verbal consent, patient was seated on exam table. Left knee was prepped with alcohol swab and utilizing anterolateral approach, patient's left knee space was injected with 4:1  marcaine 0.5%: Kenalog 40mg /dL. Patient tolerated the procedure well without immediate complications.    Impression and Recommendations:     The above documentation has been reviewed and is accurate and complete Lyndal Pulley, DO

## 2021-10-11 ENCOUNTER — Ambulatory Visit: Payer: Managed Care, Other (non HMO) | Admitting: Family Medicine

## 2021-10-11 ENCOUNTER — Other Ambulatory Visit: Payer: Self-pay

## 2021-10-11 ENCOUNTER — Encounter: Payer: Self-pay | Admitting: Family Medicine

## 2021-10-11 DIAGNOSIS — M170 Bilateral primary osteoarthritis of knee: Secondary | ICD-10-CM | POA: Diagnosis not present

## 2021-10-11 NOTE — Patient Instructions (Addendum)
Injections today See you again in 2 months

## 2021-10-11 NOTE — Assessment & Plan Note (Signed)
Patient has done really well overall.  Discussed with patient icing regimen and home exercises.  Discussed which activities to do which wants to avoid.  We will do the steroid injection again with patient having greater than 6 months of improvement.  If starting to have worsening pain again will have patient seen in 2 months and we will get approval for the viscosupplementation.  Follow-up again 2 months

## 2021-10-25 ENCOUNTER — Other Ambulatory Visit: Payer: Self-pay | Admitting: Family Medicine

## 2021-10-25 DIAGNOSIS — Z1231 Encounter for screening mammogram for malignant neoplasm of breast: Secondary | ICD-10-CM

## 2021-11-06 ENCOUNTER — Telehealth: Payer: Self-pay

## 2021-11-06 NOTE — Telephone Encounter (Signed)
Left patient message to see if she can come in on 11/10 for gel injection as it is only authorized until 11/19/2021.

## 2021-11-08 ENCOUNTER — Ambulatory Visit: Payer: Managed Care, Other (non HMO) | Admitting: Family Medicine

## 2021-11-08 NOTE — Telephone Encounter (Signed)
Left message for patient to call to schedule gel injection as approval expires on 11/19/2021.

## 2021-11-10 ENCOUNTER — Other Ambulatory Visit: Payer: Self-pay | Admitting: Family Medicine

## 2021-11-13 ENCOUNTER — Ambulatory Visit: Payer: Managed Care, Other (non HMO) | Admitting: Family Medicine

## 2021-11-14 NOTE — Progress Notes (Signed)
Zach [facility] [place] [address] [place] Phone: [phone] Subjective:   [name], am serving as a scribe for Dr. Hulan Saas. This visit occurred during the SARS-CoV-2 public health emergency.  Safety protocols were in place, including screening questions prior to the visit, additional usage of staff PPE, and extensive cleaning of exam room while observing appropriate contact time as indicated for disinfecting solutions.   I'm seeing this patient by the request  of:  Leone Haven, MD  CC: Bilateral knee pain follow-up  GLO:[id number]  10/11/2021 Patient has done really well overall.  Discussed with patient icing regimen and home exercises.  Discussed which activities to do which wants to avoid.  We will do the steroid injection again with patient having greater than 6 months of improvement.  If starting to have worsening pain again will have patient seen in 2 months and we will get approval for the viscosupplementation.  Follow-up again 2 months  Update 11/15/2021 Teasia Zapf Aube is a 65 y.o. female coming in with complaint of B knee pain. Durolane  approved B knees. Patient states pain has decreased since last injection. No new complaints. Known arthritic changes of the knees bilaterally.  Wanting to avoid any surgical intervention.  Chronic problem with worsening symptoms at this time.     Past Medical History:  Diagnosis Date   Asthma    GERD (gastroesophageal reflux disease) 04/01/2019   H. pylori infection    History of UTI    Hyperlipidemia    Hypertension    Hyperthyroidism    Normocytic anemia 05/10/2020   Osteoporosis 01/02/2021   Pes anserinus bursitis of both knees 07/29/2018   Tubular adenoma of colon    Past Surgical History:  Procedure Laterality Date   BREAST BIOPSY Right 08/15/2017   FIBROADENOMATOID CHANGE    CESAREAN SECTION     Social History   Socioeconomic History   Marital status: Single    Spouse  name: Not on file   Number of children: 1   Years of education: Not on file   Highest education level: Not on file  Occupational History   Not on file  Tobacco Use   Smoking status: Never   Smokeless tobacco: Never  Vaping Use   Vaping Use: Never used  Substance and Sexual Activity   Alcohol use: No    Alcohol/week: 0.0 standard drinks   Drug use: No   Sexual activity: Not on file  Other Topics Concern   Not on file  Social History Narrative   Lives in [place]; Doctor, hospital at [employer]; 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    Social Determinants of Health   Financial Resource Strain: Not on file  Food Insecurity: Not on file  Transportation Needs: Not on file  Physical Activity: Not on file  Stress: Not on file  Social Connections: Not on file   No Known Allergies Family History  Problem Relation Age of Onset   Hypertension Mother    Hypertension Father    Diabetes Father    Colon cancer Neg Hx    Colon polyps Neg Hx    Pancreatic cancer Neg Hx    Rectal cancer Neg Hx    Stomach cancer Neg Hx    Breast cancer Neg Hx      Current Outpatient Medications (Cardiovascular):    amLODipine (NORVASC) 10 MG tablet, TAKE ONE TABLET BY MOUTH DAILY   carvedilol (COREG) 12.5 MG tablet, TAKE ONE  TABLET BY MOUTH TWICE A DAY WITH MEALS   hydrochlorothiazide (HYDRODIURIL) 25 MG tablet, TAKE ONE TABLET BY MOUTH DAILY   rosuvastatin (CRESTOR) 10 MG tablet, TAKE ONE TABLET BY MOUTH DAILY   Current Outpatient Medications (Analgesics):    aspirin EC 81 MG tablet, Take 81 mg by mouth daily.   traMADol (ULTRAM) 50 MG tablet, Take 1 tablet (50 mg total) by mouth every 12 (twelve) hours as needed.   Current Outpatient Medications (Other):    Cholecalciferol (VITAMIN D3) 125 MCG (5000 UT) TABS, Take by mouth. Pt takes 2 daily   Diclofenac Sodium (PENNSAID) 2 % SOLN, Place 2 g onto the skin 2 (two) times daily.   dicyclomine (BENTYL) 10 MG capsule, TAKE ONE CAPSULE BY MOUTH  THREE TIMES A DAY AS NEEDED FOR CRAMPS   gabapentin (NEURONTIN) 100 MG capsule, Take 2 capsules (200 mg total) by mouth at bedtime.   gabapentin (NEURONTIN) 300 MG capsule, Take 1 capsule (300 mg total) by mouth every evening.   Probiotic Product (DIGESTIVE ADVANTAGE PO), Take by mouth daily.   Reviewed prior external information including notes and imaging from  primary care provider As well as notes that were available from care everywhere and other healthcare systems.  Past medical history, social, surgical and family history all reviewed in electronic medical record.  No pertanent information unless stated regarding to the chief complaint.   Review of Systems:  No headache, visual changes, nausea, vomiting, diarrhea, constipation, dizziness, abdominal pain, skin rash, fevers, chills, night sweats, weight loss, swollen lymph nodes, body aches, chest pain, shortness of breath, mood changes. POSITIVE muscle aches, joint swelling  Objective  Blood pressure 136/72, pulse 63, height 5\' 1"  (1.549 m), weight 189 lb (85.7 kg), SpO2 99 %.   General: No apparent distress alert and oriented x3 mood and affect normal, dressed appropriately.  HEENT: Pupils equal, extraocular movements intact  Respiratory: Patient's speak in full sentences and does not appear short of breath  Cardiovascular: No lower extremity edema, non tender, no erythema  Gait normal with good balance and coordination.  MSK:   Bilateral knee exam show the patient does have instability noted with valgus and varus force. Lacks last 10 degrees of flexion bilaterally.  After informed written and verbal consent, patient was seated on exam table. Right knee was prepped with alcohol swab and utilizing anterolateral approach, patient's right knee space was injected with 60 mg per 3 mL of Durolane (sodium hyaluronate) in a prefilled syringe was injected easily into the knee through a 22-gauge needle..Patient tolerated the procedure well  without immediate complications.  After informed written and verbal consent, patient was seated on exam table. Left knee was prepped with alcohol swab and utilizing anterolateral approach, patient's left knee space was injected with 60 mg per 3 mL of Durolane (sodium hyaluronate) in a prefilled syringe was injected easily into the knee through a 22-gauge needle..Patient tolerated the procedure well without immediate complications.   Impression and Recommendations:     The above documentation has been reviewed and is accurate and complete Lyndal Pulley, DO

## 2021-11-15 ENCOUNTER — Ambulatory Visit (INDEPENDENT_AMBULATORY_CARE_PROVIDER_SITE_OTHER): Payer: Managed Care, Other (non HMO) | Admitting: Family Medicine

## 2021-11-15 ENCOUNTER — Encounter: Payer: Self-pay | Admitting: Family Medicine

## 2021-11-15 ENCOUNTER — Other Ambulatory Visit: Payer: Self-pay

## 2021-11-15 DIAGNOSIS — M170 Bilateral primary osteoarthritis of knee: Secondary | ICD-10-CM

## 2021-11-15 NOTE — Assessment & Plan Note (Signed)
Chronic problem with exacerbation.  Has responded well to injections previously.  Discussed icing regimen and home exercises, discussed which activities to potentially avoid.  Follow-up with me again in 6 to 8 weeks.

## 2021-11-15 NOTE — Patient Instructions (Signed)
You know the drill Good to see you! Happy Holidays Keep the appointment just in case

## 2021-11-18 ENCOUNTER — Other Ambulatory Visit: Payer: Self-pay | Admitting: Family Medicine

## 2021-11-29 ENCOUNTER — Other Ambulatory Visit: Payer: Self-pay

## 2021-11-29 ENCOUNTER — Ambulatory Visit
Admission: RE | Admit: 2021-11-29 | Discharge: 2021-11-29 | Disposition: A | Discharge status: Home / Self Care | Payer: Managed Care, Other (non HMO) | Source: Ambulatory Visit | Attending: Family Medicine | Admitting: Family Medicine

## 2021-11-29 DIAGNOSIS — Z1231 Encounter for screening mammogram for malignant neoplasm of breast: Secondary | ICD-10-CM | POA: Insufficient documentation

## 2021-12-13 ENCOUNTER — Ambulatory Visit: Payer: Managed Care, Other (non HMO) | Admitting: Family Medicine

## 2022-01-12 ENCOUNTER — Other Ambulatory Visit: Payer: Self-pay

## 2022-01-12 DIAGNOSIS — I10 Essential (primary) hypertension: Secondary | ICD-10-CM

## 2022-01-12 MED ORDER — HYDROCHLOROTHIAZIDE 25 MG PO TABS: 25.0000 mg | ORAL_TABLET | Freq: Every day | ORAL | 0 refills | Status: DC

## 2022-02-06 NOTE — Progress Notes (Signed)
Gail Baker [address] [place] Phone: [phone] Subjective:   Gail Baker, am serving as a scribe for Dr. Hulan Saas. This visit occurred during the SARS-CoV-2 public health emergency.  Safety protocols were in place, including screening questions prior to the visit, additional usage of staff PPE, and extensive cleaning of exam room while observing appropriate contact time as indicated for disinfecting solutions.   I'm seeing this patient by the request  of:  Leone Haven, MD  CC: bilateral knee pain   UVO:[id number]  11/15/2021 Chronic problem with exacerbation.  Has responded well to injections previously.  Discussed icing regimen and home exercises, discussed which activities to potentially avoid.  Follow-up with me again in 6 to 8 weeks.  Update 02/07/2022 Gail Baker is a 67 y.o. female coming in with complaint of B knee pain. Patient authorized for Durolane injections. Patient states injections seemed to help. Today not too bad. Yesterday everything seemed to calm down. No new complaints.  Patient still states that it does affect daily activities.       Past Medical History:  Diagnosis Date   Asthma    GERD (gastroesophageal reflux disease) 04/01/2019   H. pylori infection    History of UTI    Hyperlipidemia    Hypertension    Hyperthyroidism    Normocytic anemia 05/10/2020   Osteoporosis 01/02/2021   Pes anserinus bursitis of both knees 07/29/2018   Tubular adenoma of colon    Past Surgical History:  Procedure Laterality Date   BREAST BIOPSY Right 08/15/2017   FIBROADENOMATOID CHANGE    CESAREAN SECTION     Social History   Socioeconomic History   Marital status: Single    Spouse name: Not on file   Number of children: 1   Years of education: Not on file   Highest education level: Not on file  Occupational History   Not on file  Tobacco Use   Smoking status: Never   Smokeless tobacco:  Never  Vaping Use   Vaping Use: Never used  Substance and Sexual Activity   Alcohol use: No    Alcohol/week: 0.0 standard drinks   Drug use: No   Sexual activity: Not on file  Other Topics Concern   Not on file  Social History Narrative   Lives in [place]; Doctor, hospital at [employer]; 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    Social Determinants of Health   Financial Resource Strain: Not on file  Food Insecurity: Not on file  Transportation Needs: Not on file  Physical Activity: Not on file  Stress: Not on file  Social Connections: Not on file   No Known Allergies Family History  Problem Relation Age of Onset   Hypertension Mother    Hypertension Father    Diabetes Father    Colon cancer Neg Hx    Colon polyps Neg Hx    Pancreatic cancer Neg Hx    Rectal cancer Neg Hx    Stomach cancer Neg Hx    Breast cancer Neg Hx      Current Outpatient Medications (Cardiovascular):    amLODipine (NORVASC) 10 MG tablet, TAKE ONE TABLET BY MOUTH DAILY   carvedilol (COREG) 12.5 MG tablet, TAKE ONE TABLET BY MOUTH TWICE A DAY WITH MEALS   hydrochlorothiazide (HYDRODIURIL) 25 MG tablet, Take 1 tablet (25 mg total) by mouth daily.   rosuvastatin (CRESTOR) 10 MG tablet, TAKE ONE TABLET BY MOUTH DAILY  Current Outpatient Medications (Analgesics):    aspirin EC 81 MG tablet, Take 81 mg by mouth daily.   traMADol (ULTRAM) 50 MG tablet, Take 1 tablet (50 mg total) by mouth every 12 (twelve) hours as needed.   Current Outpatient Medications (Other):    Cholecalciferol (VITAMIN D3) 125 MCG (5000 UT) TABS, Take by mouth. Pt takes 2 daily   Diclofenac Sodium (PENNSAID) 2 % SOLN, Place 2 g onto the skin 2 (two) times daily.   dicyclomine (BENTYL) 10 MG capsule, TAKE ONE CAPSULE BY MOUTH THREE TIMES A DAY AS NEEDED FOR CRAMPS   gabapentin (NEURONTIN) 100 MG capsule, Take 2 capsules (200 mg total) by mouth at bedtime.   gabapentin (NEURONTIN) 300 MG capsule, Take 1 capsule (300 mg total)  by mouth every evening.   Probiotic Product (DIGESTIVE ADVANTAGE PO), Take by mouth daily.   Reviewed prior external information including notes and imaging from  primary care provider As well as notes that were available from care everywhere and other healthcare systems.  Past medical history, social, surgical and family history all reviewed in electronic medical record.  No pertanent information unless stated regarding to the chief complaint.   Review of Systems:  No headache, visual changes, nausea, vomiting, diarrhea, constipation, dizziness, abdominal pain, skin rash, fevers, chills, night sweats, weight loss, swollen lymph nodes, body aches, joint swelling, chest pain, shortness of breath, mood changes. POSITIVE muscle aches  Objective  Blood pressure 126/82, pulse 72, height 5\' 1"  (1.549 m), weight 186 lb (84.4 kg), SpO2 98 %.   General: No apparent distress alert and oriented x3 mood and affect normal, dressed appropriately.  HEENT: Pupils equal, extraocular movements intact  Respiratory: Patient's speak in full sentences and does not appear short of breath  Cardiovascular: trace  lower extremity edema, non tender, no erythema  Gait antalgic  MSK:  knee exam bilaterally shows the patient does have arthritic changes noted.  Instability with valgus and varus force.  No significant arthritic changes noted of the knees.  After informed written and verbal consent, patient was seated on exam table. Right knee was prepped with alcohol swab and utilizing anterolateral approach, patient's right knee space was injected with 4:1  marcaine 0.5%: Kenalog 40mg /dL. Patient tolerated the procedure well without immediate complications.  After informed written and verbal consent, patient was seated on exam table. Left knee was prepped with alcohol swab and utilizing anterolateral approach, patient's left knee space was injected with 4:1  marcaine 0.5%: Kenalog 40mg /dL. Patient tolerated the procedure  well without immediate complications.     Impression and Recommendations:     The above documentation has been reviewed and is accurate and complete Lyndal Pulley, DO

## 2022-02-07 ENCOUNTER — Ambulatory Visit (INDEPENDENT_AMBULATORY_CARE_PROVIDER_SITE_OTHER): Payer: Managed Care, Other (non HMO) | Admitting: Family Medicine

## 2022-02-07 ENCOUNTER — Other Ambulatory Visit: Payer: Self-pay

## 2022-02-07 ENCOUNTER — Encounter: Payer: Self-pay | Admitting: Family Medicine

## 2022-02-07 DIAGNOSIS — M170 Bilateral primary osteoarthritis of knee: Secondary | ICD-10-CM | POA: Diagnosis not present

## 2022-02-07 NOTE — Patient Instructions (Signed)
Injected both knees Will get gel approved See me in 5-6 weeks

## 2022-02-07 NOTE — Assessment & Plan Note (Signed)
Chronic, with exacerbation.  Patient still wants to hold on any type of surgical intervention.  Did have a little increase in swelling recently.  Would be a candidate for viscosupplementation again in May.  We will schedule that as appropriately.  We will try to get prior approval.  We will follow-up again in 6 to 8 weeks

## 2022-02-12 ENCOUNTER — Other Ambulatory Visit: Payer: Self-pay | Admitting: Family Medicine

## 2022-02-12 DIAGNOSIS — I10 Essential (primary) hypertension: Secondary | ICD-10-CM

## 2022-02-14 ENCOUNTER — Encounter: Payer: Self-pay | Admitting: Family Medicine

## 2022-02-14 ENCOUNTER — Other Ambulatory Visit: Payer: Self-pay

## 2022-02-14 ENCOUNTER — Ambulatory Visit (INDEPENDENT_AMBULATORY_CARE_PROVIDER_SITE_OTHER): Payer: Managed Care, Other (non HMO) | Admitting: Family Medicine

## 2022-02-14 DIAGNOSIS — E669 Obesity, unspecified: Secondary | ICD-10-CM

## 2022-02-14 DIAGNOSIS — E785 Hyperlipidemia, unspecified: Secondary | ICD-10-CM

## 2022-02-14 DIAGNOSIS — M810 Age-related osteoporosis without current pathological fracture: Secondary | ICD-10-CM

## 2022-02-14 DIAGNOSIS — I10 Essential (primary) hypertension: Secondary | ICD-10-CM

## 2022-02-14 LAB — BASIC METABOLIC PANEL
BUN: 13 mg/dL (ref 6–23)
CO2: 27 mEq/L (ref 19–32)
Calcium: 9.1 mg/dL (ref 8.4–10.5)
Chloride: 106 mEq/L (ref 96–112)
Creatinine, Ser: 0.68 mg/dL (ref 0.40–1.20)
GFR: 90.35 mL/min (ref >60.00)
Glucose, Bld: 85 mg/dL (ref 70–99)
Potassium: 3.4 mEq/L — ABNORMAL LOW (ref 3.5–5.1)
Sodium: 142 mEq/L (ref 135–145)

## 2022-02-14 LAB — VITAMIN D 25 HYDROXY (VIT D DEFICIENCY, FRACTURES): VITD: 63.73 ng/mL (ref 30.00–100.00)

## 2022-02-14 NOTE — Assessment & Plan Note (Signed)
Well-controlled on prior check.  She will continue Crestor 10 mg daily.

## 2022-02-14 NOTE — Assessment & Plan Note (Signed)
I encouraged continued healthy diet.  Discussed adding in exercise 2 to 3 days a week.

## 2022-02-14 NOTE — Assessment & Plan Note (Signed)
Well-controlled.  She will continue amlodipine 10 mg once daily, carvedilol 12.5 mg twice daily, and hydrochlorothiazide 25 mg daily.

## 2022-02-14 NOTE — Patient Instructions (Signed)
Nice to see you. If you do not hear from [place] in several weeks regarding the Prolia please let us know. Please start on an over-the-counter calcium supplement 1200 mg a day. Please try to add in some exercise 2 to 3 days a week.

## 2022-02-14 NOTE — Progress Notes (Signed)
Tommi Rumps, MD Phone: [phone]  Gail Baker is a 66 y.o. female who presents today for f/u.  HYPERTENSION Disease Monitoring Home BP Monitoring 175Z systolic Chest pain- no    Dyspnea- no Medications Compliance-  taking amlodipine, coreg, HCTZ.  Edema- no BMET    Component Value Date/Time   NA 142 05/08/2021 0955   K 3.7 05/08/2021 0955   CL 107 05/08/2021 0955   CO2 28 05/08/2021 0955   GLUCOSE 84 05/08/2021 0955   BUN 13 05/08/2021 0955   CREATININE 0.68 05/08/2021 0955   CALCIUM 9.1 05/08/2021 0955   CALCIUM CANCELED 05/08/2021 0955   GFRNONAA >60 05/10/2020 1208   GFRAA >60 05/10/2020 1208   HYPERLIPIDEMIA Symptoms Chest pain on exertion:  no   Medications: Compliance- taking crestor Right upper quadrant pain- no  Muscle aches- no Lipid Panel     Component Value Date/Time   CHOL 132 05/08/2021 0955   TRIG 35.0 05/08/2021 0955   HDL 59.60 05/08/2021 0955   CHOLHDL 2 05/08/2021 0955   VLDL 7.0 05/08/2021 0955   LDLCALC 66 05/08/2021 0955   LDLDIRECT 61.0 03/12/2018 0933   Obesity: Patient eats a generally healthy diet.  She does not eat any sugar.  She does eat chicken and fish.  Eats lots of African food.  Has plantains, okra, spinach, melon seeds, and palm fruit.  She exercises once in a while with walking on the treadmill and using dumbbells.  Osteoporosis: She notes her insurance would not pay for Forteo.  She takes vitamin D 1000 units twice daily.  No calcium supplement.  No history of esophageal or gastric ulcers.  No history of swallowing issues.  Social History   Tobacco Use  Smoking Status Never  Smokeless Tobacco Never    Current Outpatient Medications on File Prior to Visit  Medication Sig Dispense Refill   amLODipine (NORVASC) 10 MG tablet TAKE ONE TABLET BY MOUTH DAILY 90 tablet 1   aspirin EC 81 MG tablet Take 81 mg by mouth daily.     carvedilol (COREG) 12.5 MG tablet TAKE ONE TABLET BY MOUTH TWICE A DAY WITH MEALS 180 tablet  1   Cholecalciferol (VITAMIN D3) 125 MCG (5000 UT) TABS Take by mouth. Pt takes 2 daily     Diclofenac Sodium (PENNSAID) 2 % SOLN Place 2 g onto the skin 2 (two) times daily. 112 g 3   dicyclomine (BENTYL) 10 MG capsule TAKE ONE CAPSULE BY MOUTH THREE TIMES A DAY AS NEEDED FOR CRAMPS 90 capsule 1   gabapentin (NEURONTIN) 100 MG capsule Take 2 capsules (200 mg total) by mouth at bedtime. 180 capsule 0   gabapentin (NEURONTIN) 300 MG capsule Take 1 capsule (300 mg total) by mouth every evening. 30 capsule 0   hydrochlorothiazide (HYDRODIURIL) 25 MG tablet TAKE ONE TABLET BY MOUTH DAILY 30 tablet 0   Probiotic Product (DIGESTIVE ADVANTAGE PO) Take by mouth daily.     rosuvastatin (CRESTOR) 10 MG tablet TAKE ONE TABLET BY MOUTH DAILY 90 tablet 3   traMADol (ULTRAM) 50 MG tablet Take 1 tablet (50 mg total) by mouth every 12 (twelve) hours as needed. 30 tablet 0   No current facility-administered medications on file prior to visit.     ROS see history of present illness  Objective  Physical Exam Vitals:   02/14/22 1058 02/14/22 1121  BP: 140/80 120/80  Pulse: 69   Temp: 99.2 F (37.3 C)   SpO2: 99%     BP Readings from  Last 3 Encounters:  02/14/22 120/80  02/07/22 126/82  11/15/21 136/72   Wt Readings from Last 3 Encounters:  02/14/22 184 lb 12.8 oz (83.8 kg)  02/07/22 186 lb (84.4 kg)  11/15/21 189 lb (85.7 kg)    Physical Exam Constitutional:      General: She is not in acute distress.    Appearance: She is not diaphoretic.  Cardiovascular:     Rate and Rhythm: Normal rate and regular rhythm.     Heart sounds: Normal heart sounds.  Pulmonary:     Effort: Pulmonary effort is normal.     Breath sounds: Normal breath sounds.  Skin:    General: Skin is warm and dry.  Neurological:     Mental Status: She is alert.     Assessment/Plan: Please see individual problem list.  Problem List Items Addressed This Visit     Hyperlipidemia    Well-controlled on prior check.   She will continue Crestor 10 mg daily.      Hypertension    Well-controlled.  She will continue amlodipine 10 mg once daily, carvedilol 12.5 mg twice daily, and hydrochlorothiazide 25 mg daily.      Relevant Orders   Basic Metabolic Panel (BMET)   Obesity (BMI 30.0-34.9)    I encouraged continued healthy diet.  Discussed adding in exercise 2 to 3 days a week.      Osteoporosis    We will try to get her approved for Prolia.  I discussed possible side effects including joint aching and muscle aches.  Discussed risk of osteonecrosis of the jaw.  Discussed this seems to be a relatively small risk and having dental procedures or jaw work while on this medication would increase the risk.  We will check vitamin D and calcium levels today.  She will start on a calcium supplement 1200 mg daily.  She will continue her vitamin D supplement.      Relevant Orders   Vitamin D (25 hydroxy)   Basic Metabolic Panel (BMET)     Health Maintenance: due for colonoscopy.  The patient declines colonoscopy at this time given issues with bills and wanting to pay those off.  She understands the risk of missing a clinically significant lesion such as a polyp or a cancer.  She will let me know when she would like to have her colonoscopy completed.  Return in about 6 months (around 08/14/2022) for Hypertension/hyperlipidemia.  This visit occurred during the SARS-CoV-2 public health emergency.  Safety protocols were in place, including screening questions prior to the visit, additional usage of staff PPE, and extensive cleaning of exam room while observing appropriate contact time as indicated for disinfecting solutions.    Tommi Rumps, MD [place]

## 2022-02-14 NOTE — Assessment & Plan Note (Signed)
We will try to get her approved for Prolia.  I discussed possible side effects including joint aching and muscle aches.  Discussed risk of osteonecrosis of the jaw.  Discussed this seems to be a relatively small risk and having dental procedures or jaw work while on this medication would increase the risk.  We will check vitamin D and calcium levels today.  She will start on a calcium supplement 1200 mg daily.  She will continue her vitamin D supplement.

## 2022-03-05 ENCOUNTER — Other Ambulatory Visit: Payer: Self-pay | Admitting: *Deleted

## 2022-03-05 DIAGNOSIS — M810 Age-related osteoporosis without current pathological fracture: Secondary | ICD-10-CM

## 2022-03-05 NOTE — Telephone Encounter (Signed)
-----   Message from Leone Haven, MD sent at 02/15/2022 12:18 PM EST ----- ?Can we attempt to get this patient approved for prolia? Thanks.  ?

## 2022-03-05 NOTE — Telephone Encounter (Signed)
Authorization has been submitted to [employer] for approval for Prolia. ?

## 2022-03-12 ENCOUNTER — Other Ambulatory Visit: Payer: Self-pay | Admitting: Family Medicine

## 2022-03-12 DIAGNOSIS — I10 Essential (primary) hypertension: Secondary | ICD-10-CM

## 2022-03-17 ENCOUNTER — Other Ambulatory Visit: Payer: Self-pay | Admitting: Family Medicine

## 2022-03-17 DIAGNOSIS — I10 Essential (primary) hypertension: Secondary | ICD-10-CM

## 2022-03-23 NOTE — Telephone Encounter (Signed)
Benefit verification still in process. ?

## 2022-04-09 ENCOUNTER — Other Ambulatory Visit: Payer: Self-pay | Admitting: Family Medicine

## 2022-04-09 DIAGNOSIS — I10 Essential (primary) hypertension: Secondary | ICD-10-CM

## 2022-04-24 NOTE — Telephone Encounter (Signed)
Prior Authorization request details: ?Prior Auth (EOC) ID: [id number] Drug/Service Name: PROLIA 60 MG/ML SYRINGE ?Patient: Gail Baker Date Requested: 04/24/2022 4:00:21 PM   ?Jearld Shines: [id number] DOB: 21-May-1956 ? ?Awaiting approval  ?

## 2022-04-24 NOTE — Telephone Encounter (Signed)
Patient would have a 30% co-pay for Prolia after meeting deductible of  $7000.00 patient says he cannot afford medication . Is there an alternative? ?

## 2022-04-25 MED ORDER — ALENDRONATE SODIUM 70 MG PO TABS: 70.0000 mg | ORAL_TABLET | ORAL | 11 refills | Status: DC

## 2022-04-25 NOTE — Telephone Encounter (Signed)
I guess we will have to try fosamax. Can you see if the patient is ok with me sending that in? ?

## 2022-04-25 NOTE — Telephone Encounter (Signed)
Patient willing to try fosamax I have pended order for approval ?

## 2022-05-08 ENCOUNTER — Other Ambulatory Visit: Payer: Self-pay | Admitting: Family Medicine

## 2022-05-08 DIAGNOSIS — I10 Essential (primary) hypertension: Secondary | ICD-10-CM

## 2022-05-09 ENCOUNTER — Ambulatory Visit: Payer: Managed Care, Other (non HMO) | Admitting: Family Medicine

## 2022-05-14 ENCOUNTER — Other Ambulatory Visit: Payer: Self-pay | Admitting: Family Medicine

## 2022-05-14 DIAGNOSIS — M170 Bilateral primary osteoarthritis of knee: Secondary | ICD-10-CM

## 2022-05-24 ENCOUNTER — Other Ambulatory Visit: Payer: Self-pay | Admitting: Family Medicine

## 2022-06-05 NOTE — Progress Notes (Unsigned)
[place] Phone: [phone] Subjective:   Gail Baker, am serving as a scribe for Dr. Hulan Saas.   I'm seeing this patient by the request  of:  Leone Haven, MD  CC: Bilateral knee pain  Gail Baker  Gail Baker is a 66 y.o. female coming in with complaint of B knee pain. Steroid injection February 2023. Patient states that her pain is not as bad as it was but still painful at times. Painful over medial patellar tendon. Steroid seems to work longer for patient.  Patient states that she still would not want any type of surgical intervention.  Patient still continues to be able to be fairly active.    Past Medical History:  Diagnosis Date   Asthma    GERD (gastroesophageal reflux disease) 04/01/2019   H. pylori infection    History of UTI    Hyperlipidemia    Hypertension    Hyperthyroidism    Normocytic anemia 05/10/2020   Osteoporosis 01/02/2021   Pes anserinus bursitis of both knees 07/29/2018   Tubular adenoma of colon    Past Surgical History:  Procedure Laterality Date   BREAST BIOPSY Right 08/15/2017   FIBROADENOMATOID CHANGE    CESAREAN SECTION     Social History   Socioeconomic History   Marital status: Single    Spouse name: Not on file   Number of children: 1   Years of education: Not on file   Highest education level: Not on file  Occupational History   Not on file  Tobacco Use   Smoking status: Never   Smokeless tobacco: Never  Vaping Use   Vaping Use: Never used  Substance and Sexual Activity   Alcohol use: Baker    Alcohol/week: 0.0 standard drinks   Drug use: Baker   Sexual activity: Not on file  Other Topics Concern   Not on file  Social History Narrative   Lives in [place]; Doctor, hospital at [employer]; 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    Social Determinants of Health   Financial Resource Strain: Not on file  Food Insecurity: Not on file   Transportation Needs: Not on file  Physical Activity: Not on file  Stress: Not on file  Social Connections: Not on file   Baker Known Allergies Family History  Problem Relation Age of Onset   Hypertension Mother    Hypertension Father    Diabetes Father    Colon cancer Neg Hx    Colon polyps Neg Hx    Pancreatic cancer Neg Hx    Rectal cancer Neg Hx    Stomach cancer Neg Hx    Breast cancer Neg Hx     Current Outpatient Medications (Endocrine & Metabolic):    alendronate (FOSAMAX) 70 MG tablet, Take 1 tablet (70 mg total) by mouth every 7 (seven) days. Take with a full glass of water on an empty stomach.  Current Outpatient Medications (Cardiovascular):    amLODipine (NORVASC) 10 MG tablet, TAKE ONE TABLET BY MOUTH DAILY   carvedilol (COREG) 12.5 MG tablet, TAKE ONE TABLET BY MOUTH TWICE A DAY WITH MEALS   hydrochlorothiazide (HYDRODIURIL) 25 MG tablet, TAKE ONE TABLET BY MOUTH DAILY   rosuvastatin (CRESTOR) 10 MG tablet, TAKE ONE TABLET BY MOUTH DAILY   Current Outpatient Medications (Analgesics):    aspirin EC 81 MG tablet, Take 81 mg by mouth daily.   traMADol (ULTRAM) 50 MG tablet, TAKE ONE  TABLET BY MOUTH EVERY 12 HOURS AS NEEDED   Current Outpatient Medications (Other):    Cholecalciferol (VITAMIN D3) 125 MCG (5000 UT) TABS, Take by mouth. Pt takes 2 daily   Diclofenac Sodium (PENNSAID) 2 % SOLN, Place 2 g onto the skin 2 (two) times daily.   dicyclomine (BENTYL) 10 MG capsule, TAKE ONE CAPSULE BY MOUTH THREE TIMES A DAY AS NEEDED FOR CRAMPS   gabapentin (NEURONTIN) 100 MG capsule, Take 2 capsules (200 mg total) by mouth at bedtime.   gabapentin (NEURONTIN) 300 MG capsule, Take 1 capsule (300 mg total) by mouth every evening.   Probiotic Product (DIGESTIVE ADVANTAGE PO), Take by mouth daily.   Reviewed prior external information including notes and imaging from  primary care provider As well as notes that were available from care everywhere and other healthcare  systems.  Past medical history, social, surgical and family history all reviewed in electronic medical record.  Baker pertanent information unless stated regarding to the chief complaint.   Review of Systems:  Baker headache, visual changes, nausea, vomiting, diarrhea, constipation, dizziness, abdominal pain, skin rash, fevers, chills, night sweats, weight loss, swollen lymph nodes, body aches, joint swelling, chest pain, shortness of breath, mood changes. POSITIVE muscle aches  Objective  Blood pressure 126/84, pulse (!) 50, height '5\' 1"'$  (1.549 m), SpO2 99 %.   General: Baker apparent distress alert and oriented x3 mood and affect normal, dressed appropriately.  HEENT: Pupils equal, extraocular movements intact  Respiratory: Patient's speak in full sentences and does not appear short of breath  Cardiovascular: Baker lower extremity edema, non tender, Baker erythema  Gait antalgic MSK: Bilateral knees do have more.  Instability noted with valgus and varus for strength.  Patient does have trace effusion noted bilaterally.  Does have crepitus noted with the patellofemoral joint.  After informed written and verbal consent, patient was seated on exam table. Right knee was prepped with alcohol swab and utilizing anterolateral approach, patient's right knee space was injected with 4:1  marcaine 0.5%: Kenalog '40mg'$ /dL. Patient tolerated the procedure well without immediate complications.  After informed written and verbal consent, patient was seated on exam table. Left knee was prepped with alcohol swab and utilizing anterolateral approach, patient's left knee space was injected with 4:1  marcaine 0.5%: Kenalog '40mg'$ /dL. Patient tolerated the procedure well without immediate complications.   Impression and Recommendations:     The above documentation has been reviewed and is accurate and complete Lyndal Pulley, DO

## 2022-06-06 ENCOUNTER — Ambulatory Visit (INDEPENDENT_AMBULATORY_CARE_PROVIDER_SITE_OTHER): Payer: Managed Care, Other (non HMO) | Admitting: Family Medicine

## 2022-06-06 DIAGNOSIS — M170 Bilateral primary osteoarthritis of knee: Secondary | ICD-10-CM

## 2022-06-06 NOTE — Patient Instructions (Signed)
Injected both knees today  Have a great summer See me in 3 months

## 2022-06-06 NOTE — Assessment & Plan Note (Signed)
Chronic, with exacerbation and worsening symptoms.  Discussed icing regimen and home exercises, which activities to do and which ones to avoid.  Increase activity slowly otherwise.  Follow-up with me again in 6 to 8 weeks.

## 2022-06-09 ENCOUNTER — Other Ambulatory Visit: Payer: Self-pay | Admitting: Family Medicine

## 2022-06-09 DIAGNOSIS — I10 Essential (primary) hypertension: Secondary | ICD-10-CM

## 2022-07-01 ENCOUNTER — Other Ambulatory Visit: Payer: Self-pay | Admitting: Family Medicine

## 2022-07-01 DIAGNOSIS — I10 Essential (primary) hypertension: Secondary | ICD-10-CM

## 2022-08-15 ENCOUNTER — Ambulatory Visit: Payer: Managed Care, Other (non HMO) | Admitting: Family Medicine

## 2022-08-20 ENCOUNTER — Other Ambulatory Visit: Payer: Self-pay | Admitting: Family Medicine

## 2022-08-20 DIAGNOSIS — I10 Essential (primary) hypertension: Secondary | ICD-10-CM

## 2022-09-12 ENCOUNTER — Ambulatory Visit: Payer: Managed Care, Other (non HMO) | Admitting: Family Medicine

## 2022-09-20 ENCOUNTER — Other Ambulatory Visit: Payer: Self-pay

## 2022-09-20 DIAGNOSIS — I10 Essential (primary) hypertension: Secondary | ICD-10-CM

## 2022-09-20 MED ORDER — HYDROCHLOROTHIAZIDE 25 MG PO TABS: 25.0000 mg | ORAL_TABLET | Freq: Every day | ORAL | 3 refills | Status: DC

## 2022-09-21 ENCOUNTER — Other Ambulatory Visit: Payer: Self-pay | Admitting: Family Medicine

## 2022-09-21 DIAGNOSIS — I10 Essential (primary) hypertension: Secondary | ICD-10-CM

## 2022-10-03 ENCOUNTER — Ambulatory Visit: Payer: Managed Care, Other (non HMO) | Admitting: Family Medicine

## 2022-10-15 ENCOUNTER — Telehealth: Payer: Self-pay | Admitting: Family Medicine

## 2022-10-15 NOTE — Telephone Encounter (Signed)
Pt called, a little bit of an understanding barrier. She has her 3 month follow up 10/25, she said she is interested in the injection Dr. Tamala Julian talked to her about at last visit ( Gel? ) but she is unsure what that injection was and may have questions. She would like to clarify before her appt as her copay's are expensive and may want this done at the [facility].

## 2022-10-16 NOTE — Telephone Encounter (Signed)
Patient would like to do PRP at next visit and would like a full dose in each knee. Does not care for gel injections.

## 2022-10-17 ENCOUNTER — Encounter: Payer: Self-pay | Admitting: Family Medicine

## 2022-10-17 ENCOUNTER — Ambulatory Visit (INDEPENDENT_AMBULATORY_CARE_PROVIDER_SITE_OTHER): Payer: Managed Care, Other (non HMO) | Admitting: Family Medicine

## 2022-10-17 VITALS — BP 122/70 | HR 82 | Temp 99.9°F | Ht 61.0 in | Wt 192.0 lb

## 2022-10-17 DIAGNOSIS — E785 Hyperlipidemia, unspecified: Secondary | ICD-10-CM | POA: Diagnosis not present

## 2022-10-17 DIAGNOSIS — M810 Age-related osteoporosis without current pathological fracture: Secondary | ICD-10-CM | POA: Diagnosis not present

## 2022-10-17 DIAGNOSIS — Z23 Encounter for immunization: Secondary | ICD-10-CM

## 2022-10-17 DIAGNOSIS — M170 Bilateral primary osteoarthritis of knee: Secondary | ICD-10-CM

## 2022-10-17 DIAGNOSIS — I10 Essential (primary) hypertension: Secondary | ICD-10-CM

## 2022-10-17 DIAGNOSIS — H9203 Otalgia, bilateral: Secondary | ICD-10-CM | POA: Insufficient documentation

## 2022-10-17 DIAGNOSIS — D709 Neutropenia, unspecified: Secondary | ICD-10-CM

## 2022-10-17 LAB — CBC WITH DIFFERENTIAL/PLATELET
Basophils Absolute: 0 10*3/uL (ref 0.0–0.1)
Basophils Relative: 0.6 % (ref 0.0–3.0)
Eosinophils Absolute: 0.1 10*3/uL (ref 0.0–0.7)
Eosinophils Relative: 3.7 % (ref 0.0–5.0)
HCT: 36.1 % (ref 36.0–46.0)
Hemoglobin: 12.4 g/dL (ref 12.0–15.0)
Lymphocytes Relative: 33.7 % (ref 12.0–46.0)
Lymphs Abs: 1 10*3/uL (ref 0.7–4.0)
MCHC: 34.3 g/dL (ref 30.0–36.0)
MCV: 85.7 fl (ref 78.0–100.0)
Monocytes Absolute: 0.5 10*3/uL (ref 0.1–1.0)
Monocytes Relative: 15.9 % — ABNORMAL HIGH (ref 3.0–12.0)
Neutro Abs: 1.4 10*3/uL (ref 1.4–7.7)
Neutrophils Relative %: 46.1 % (ref 43.0–77.0)
Platelets: 222 10*3/uL (ref 150.0–400.0)
RBC: 4.21 Mil/uL (ref 3.87–5.11)
RDW: 14.2 % (ref 11.5–15.5)
WBC: 3 10*3/uL — ABNORMAL LOW (ref 4.0–10.5)

## 2022-10-17 LAB — COMPREHENSIVE METABOLIC PANEL
ALT: 13 U/L (ref 0–35)
AST: 20 U/L (ref 0–37)
Albumin: 4.3 g/dL (ref 3.5–5.2)
Alkaline Phosphatase: 39 U/L (ref 39–117)
BUN: 10 mg/dL (ref 6–23)
CO2: 29 mEq/L (ref 19–32)
Calcium: 9.3 mg/dL (ref 8.4–10.5)
Chloride: 103 mEq/L (ref 96–112)
Creatinine, Ser: 0.65 mg/dL (ref 0.40–1.20)
GFR: 90.91 mL/min (ref >60.00)
Glucose, Bld: 104 mg/dL — ABNORMAL HIGH (ref 70–99)
Potassium: 3.5 mEq/L (ref 3.5–5.1)
Sodium: 138 mEq/L (ref 135–145)
Total Bilirubin: 0.6 mg/dL (ref 0.2–1.2)
Total Protein: 7.4 g/dL (ref 6.0–8.3)

## 2022-10-17 LAB — LIPID PANEL
Cholesterol: 143 mg/dL (ref 0–200)
HDL: 73.8 mg/dL (ref >39.00)
LDL Cholesterol: 62 mg/dL (ref 0–99)
NonHDL: 68.8
Total CHOL/HDL Ratio: 2
Triglycerides: 32 mg/dL (ref 0.0–149.0)
VLDL: 6.4 mg/dL (ref 0.0–40.0)

## 2022-10-17 NOTE — Progress Notes (Signed)
Tommi Rumps, MD Phone: [phone]  Gail Baker is a 66 y.o. female who presents today for f/u.  HYPERTENSION Disease Monitoring Home BP Monitoring notes it is good when she checks, though not checking often Chest pain- no    Dyspnea- no Medications Compliance-  taking amlodipine, HCTZ, coreg.  Edema- no BMET    Component Value Date/Time   NA 142 02/14/2022 1132   K 3.4 (L) 02/14/2022 1132   CL 106 02/14/2022 1132   CO2 27 02/14/2022 1132   GLUCOSE 85 02/14/2022 1132   BUN 13 02/14/2022 1132   CREATININE 0.68 02/14/2022 1132   CALCIUM 9.1 02/14/2022 1132   GFRNONAA >60 05/10/2020 1208   GFRAA >60 05/10/2020 1208   Osteoarthritis: Patient occasionally takes tramadol for this.  No drowsiness with this.  She is following with a specialist and they are planning on doing PRP in the near future.  Osteoporosis: Patient is taking Fosamax.  She notes no side effects.  No recent fractures.  Ear pain: Patient notes she was previously wearing some earplugs at night because her neighbors were loud.  She noted at times she would have some ear pain after wearing the earplugs.  She notes no hearing loss.  Social History   Tobacco Use  Smoking Status Never  Smokeless Tobacco Never    Current Outpatient Medications on File Prior to Visit  Medication Sig Dispense Refill   alendronate (FOSAMAX) 70 MG tablet Take 1 tablet (70 mg total) by mouth every 7 (seven) days. Take with a full glass of water on an empty stomach. 4 tablet 11   amLODipine (NORVASC) 10 MG tablet TAKE ONE TABLET BY MOUTH DAILY 90 tablet 1   aspirin EC 81 MG tablet Take 81 mg by mouth daily.     carvedilol (COREG) 12.5 MG tablet TAKE ONE TABLET BY MOUTH TWICE A DAY WITH MEALS 180 tablet 1   Cholecalciferol (VITAMIN D3) 125 MCG (5000 UT) TABS Take by mouth. Pt takes 2 daily     Diclofenac Sodium (PENNSAID) 2 % SOLN Place 2 g onto the skin 2 (two) times daily. 112 g 3   dicyclomine (BENTYL) 10 MG capsule TAKE ONE  CAPSULE BY MOUTH THREE TIMES A DAY AS NEEDED FOR CRAMPS 90 capsule 1   gabapentin (NEURONTIN) 100 MG capsule Take 2 capsules (200 mg total) by mouth at bedtime. 180 capsule 0   gabapentin (NEURONTIN) 300 MG capsule Take 1 capsule (300 mg total) by mouth every evening. 30 capsule 0   hydrochlorothiazide (HYDRODIURIL) 25 MG tablet Take 1 tablet (25 mg total) by mouth daily. 30 tablet 3   Probiotic Product (DIGESTIVE ADVANTAGE PO) Take by mouth daily.     rosuvastatin (CRESTOR) 10 MG tablet TAKE ONE TABLET BY MOUTH DAILY 90 tablet 3   traMADol (ULTRAM) 50 MG tablet TAKE ONE TABLET BY MOUTH EVERY 12 HOURS AS NEEDED 30 tablet 0   No current facility-administered medications on file prior to visit.     ROS see history of present illness  Objective  Physical Exam Vitals:   10/17/22 1031  BP: 122/70  Pulse: 82  Temp: 99.9 F (37.7 C)  SpO2: 99%    BP Readings from Last 3 Encounters:  10/17/22 122/70  06/06/22 126/84  02/14/22 120/80   Wt Readings from Last 3 Encounters:  10/17/22 192 lb (87.1 kg)  02/14/22 184 lb 12.8 oz (83.8 kg)  02/07/22 186 lb (84.4 kg)    Physical Exam Constitutional:  General: She is not in acute distress.    Appearance: She is not diaphoretic.  HENT:     Right Ear: Tympanic membrane and ear canal normal.     Left Ear: Tympanic membrane and ear canal normal.  Cardiovascular:     Rate and Rhythm: Normal rate and regular rhythm.     Heart sounds: Normal heart sounds.  Pulmonary:     Effort: Pulmonary effort is normal.     Breath sounds: Normal breath sounds.  Skin:    General: Skin is warm and dry.  Neurological:     Mental Status: She is alert.      Assessment/Plan: Please see individual problem list.  Problem List Items Addressed This Visit     Degenerative arthritis of knee, bilateral (Chronic)    She will continue to see her specialist.  She can continue tramadol 50 mg every 12 hours as needed for pain.      Hyperlipidemia  (Chronic)    Check lipids.  Continue Crestor 10 mg daily.      Relevant Orders   Comp Met (CMET)   Lipid panel   Hypertension - Primary (Chronic)    Well-controlled.  She will continue HCTZ 25 mg daily, carvedilol 12.5 mg twice daily, and amlodipine 10 mg daily.      Relevant Orders   Comp Met (CMET)   Osteoporosis (Chronic)    Patient is tolerating Fosamax well.  She will continue Fosamax 70 mg weekly.      Ear pain, bilateral    Likely related to the earplugs.  Benign exam.  She is no longer wearing earplugs.  She will monitor.      Leukopenia    Recheck labs today.      Relevant Orders   CBC w/Diff   Other Visit Diagnoses     Need for immunization against influenza       Relevant Orders   Flu Vaccine QUAD High Dose(Fluad) (Completed)       Return in about 6 months (around 04/18/2023).   Tommi Rumps, MD [place]

## 2022-10-17 NOTE — Assessment & Plan Note (Signed)
Patient is tolerating Fosamax well.  She will continue Fosamax 70 mg weekly.

## 2022-10-17 NOTE — Assessment & Plan Note (Signed)
Check lipids.  Continue Crestor 10 mg daily.

## 2022-10-17 NOTE — Assessment & Plan Note (Signed)
Likely related to the earplugs.  Benign exam.  She is no longer wearing earplugs.  She will monitor.

## 2022-10-17 NOTE — Assessment & Plan Note (Signed)
Well-controlled.  She will continue HCTZ 25 mg daily, carvedilol 12.5 mg twice daily, and amlodipine 10 mg daily.

## 2022-10-17 NOTE — Assessment & Plan Note (Signed)
She will continue to see her specialist.  She can continue tramadol 50 mg every 12 hours as needed for pain.

## 2022-10-17 NOTE — Assessment & Plan Note (Signed)
Recheck labs today. 

## 2022-10-18 ENCOUNTER — Other Ambulatory Visit: Payer: Self-pay | Admitting: Family Medicine

## 2022-10-18 DIAGNOSIS — Z1231 Encounter for screening mammogram for malignant neoplasm of breast: Secondary | ICD-10-CM

## 2022-10-23 NOTE — Progress Notes (Unsigned)
  [place] Phone: [phone] Subjective:   [name], am serving as a scribe for Dr. Hulan Saas.  I'm seeing this patient by the request  of:  Leone Haven, MD  CC: Bilateral knee pain  VOZ:[id number]  06/06/2022 Chronic, with exacerbation and worsening symptoms.  Discussed icing regimen and home exercises, which activities to do and which ones to avoid.  Increase activity slowly otherwise.  Follow-up with me again in 6 to 8 weeks.  Update 10/24/2022 Gail Baker is a 66 y.o. female coming in with complaint of B knee pain. Patient here for PRP today. Patient states continued pain on regular basis and does not feel that the steroid injections or the viscosupplementation has been significantly helpful at the moment.       Past Medical History:  Diagnosis Date   Asthma    GERD (gastroesophageal reflux disease) 04/01/2019   H. pylori infection    History of UTI    Hyperlipidemia    Hypertension    Hyperthyroidism    Normocytic anemia 05/10/2020   Osteoporosis 01/02/2021   Pes anserinus bursitis of both knees 07/29/2018   Tubular adenoma of colon    Past Surgical History:  Procedure Laterality Date   BREAST BIOPSY Right 08/15/2017   FIBROADENOMATOID CHANGE    CESAREAN SECTION       Objective  Blood pressure 128/74, pulse (!) 50, height '5\' 1"'$  (1.549 m), weight 194 lb (88 kg), SpO2 99 %.   General: No apparent distress alert and oriented x3 mood and affect normal, dressed appropriately.  HEENT: Pupils equal, extraocular movements intact  Respiratory: Patient's speak in full sentences and does not appear short of breath  Cardiovascular: No lower extremity edema, non tender, no erythema  Antalgic gait noted. Bilateral knees do have some instability noted.  Patient does have effusion noted of the knees bilaterally.  Lacks last 10 degrees of extension.  After informed written and  verbal consent, patient was seated on exam table. Right knee was prepped with alcohol swab and utilizing anterolateral approach, patient's right knee space was injected with 2 cc of 0.5% Marcaine and then 5 cc of PRP. Patient tolerated the procedure well without immediate complications.  After informed written and verbal consent, patient was seated on exam table. Left knee was prepped with alcohol swab and utilizing anterolateral approach, patient's left knee space was injected with 2 cc of 0.5% Marcaine and 5 cc of PRP. Patient tolerated the procedure well without immediate complications.   Impression and Recommendations:    The above documentation has been reviewed and is accurate and complete Lyndal Pulley, DO

## 2022-10-24 ENCOUNTER — Ambulatory Visit (INDEPENDENT_AMBULATORY_CARE_PROVIDER_SITE_OTHER): Payer: Self-pay | Admitting: Family Medicine

## 2022-10-24 ENCOUNTER — Encounter: Payer: Self-pay | Admitting: Family Medicine

## 2022-10-24 DIAGNOSIS — M170 Bilateral primary osteoarthritis of knee: Secondary | ICD-10-CM

## 2022-10-24 NOTE — Assessment & Plan Note (Signed)
Bilateral PRP given today.  Post PRP protocol discussed.  Patient would like to follow-up more on an as-needed basis.  Otherwise we did discuss with patient the severity of the arthritic changes and if failing all other conservative therapy would need to consider the possibility of surgical intervention.

## 2022-10-24 NOTE — Patient Instructions (Signed)
No ice or IBU for 3 days Heat and Tylenol are ok See me again in 6-7 weeks 

## 2022-11-10 ENCOUNTER — Other Ambulatory Visit: Payer: Self-pay | Admitting: Family Medicine

## 2022-11-22 ENCOUNTER — Other Ambulatory Visit: Payer: Self-pay | Admitting: Family Medicine

## 2022-11-22 DIAGNOSIS — I10 Essential (primary) hypertension: Secondary | ICD-10-CM

## 2022-12-03 ENCOUNTER — Ambulatory Visit
Admission: RE | Admit: 2022-12-03 | Discharge: 2022-12-03 | Disposition: A | Discharge status: Home / Self Care | Payer: Managed Care, Other (non HMO) | Source: Ambulatory Visit | Attending: Family Medicine | Admitting: Family Medicine

## 2022-12-03 DIAGNOSIS — Z1231 Encounter for screening mammogram for malignant neoplasm of breast: Secondary | ICD-10-CM | POA: Insufficient documentation

## 2022-12-05 ENCOUNTER — Ambulatory Visit (LOCAL_COMMUNITY_HEALTH_CENTER): Payer: Managed Care, Other (non HMO)

## 2022-12-05 DIAGNOSIS — Z23 Encounter for immunization: Secondary | ICD-10-CM | POA: Diagnosis not present

## 2022-12-05 DIAGNOSIS — Z719 Counseling, unspecified: Secondary | ICD-10-CM

## 2022-12-05 NOTE — Progress Notes (Signed)
  Are you feeling sick today? No   Have you ever received a dose of COVID-19 Vaccine? [employer], [place], [place], [place], Other) Yes  If yes, which vaccine and how many doses?   Pfizer, 5   Did you bring the vaccination record card or other documentation?  No   Do you have a health condition or are undergoing treatment that makes you moderately or severely immunocompromised? This would include, but not be limited to: cancer, HIV, organ transplant, immunosuppressive therapy/high-dose corticosteroids, or moderate/severe primary immunodeficiency.  No  Have you received COVID-19 vaccine before or during hematopoietic cell transplant (HCT) or CAR-T-cell therapies? No  Have you ever had an allergic reaction to: (This would include a severe allergic reaction or a reaction that caused hives, swelling, or respiratory distress, including wheezing.) A component of a COVID-19 vaccine or a previous dose of COVID-19 vaccine? No   Have you ever had an allergic reaction to another vaccine (other thanCOVID-19 vaccine) or an injectable medication? (This would include a severe allergic reaction or a reaction that caused hives, swelling, or respiratory distress, including wheezing.)   No    Do you have a history of any of the following:  Myocarditis or Pericarditis No  Dermal fillers:  No  Multisystem Inflammatory Syndrome (MIS-C or MIS-A)? No  COVID-19 disease within the past 3 months? No  Vaccinated with monkeypox vaccine in the last 4 weeks? No  Eligible, administered Pfizer comirnaty [phone], monitored, tolerated well. Provided VIS and NCIR copy. M.Marquavius Scaife, LPN.

## 2022-12-17 ENCOUNTER — Other Ambulatory Visit: Payer: Self-pay

## 2022-12-17 DIAGNOSIS — I10 Essential (primary) hypertension: Secondary | ICD-10-CM

## 2022-12-17 MED ORDER — HYDROCHLOROTHIAZIDE 25 MG PO TABS: 25.0000 mg | ORAL_TABLET | Freq: Every day | ORAL | 3 refills | Status: DC

## 2022-12-18 ENCOUNTER — Encounter: Payer: Self-pay | Admitting: Family Medicine

## 2022-12-19 ENCOUNTER — Ambulatory Visit: Payer: Managed Care, Other (non HMO) | Admitting: Family Medicine

## 2022-12-19 ENCOUNTER — Ambulatory Visit: Payer: Self-pay

## 2022-12-19 VITALS — BP 112/74 | HR 65 | Ht 61.0 in | Wt 195.0 lb

## 2022-12-19 DIAGNOSIS — G8929 Other chronic pain: Secondary | ICD-10-CM

## 2022-12-19 DIAGNOSIS — M25562 Pain in left knee: Secondary | ICD-10-CM | POA: Diagnosis not present

## 2022-12-19 DIAGNOSIS — M25561 Pain in right knee: Secondary | ICD-10-CM | POA: Diagnosis not present

## 2022-12-19 DIAGNOSIS — M170 Bilateral primary osteoarthritis of knee: Secondary | ICD-10-CM | POA: Diagnosis not present

## 2022-12-19 NOTE — Patient Instructions (Signed)
Happy Holidays Injected both knees today See me in 2-3 months

## 2022-12-19 NOTE — Assessment & Plan Note (Signed)
Chronic, with worsening symptoms.  Patient has been going on now nearly 2 years of injections.  Has not worsened at all and has actually increased some of her activity but she still has chronic problem with exacerbation.  Can affect daily activities.  This with patient about icing regimen and home exercises.  Increase activity slowly.  Follow-up again in 8 weeks due to financial constraints patient would not be able to take time off for surgical intervention.

## 2022-12-19 NOTE — Progress Notes (Signed)
[place] Phone: [phone] Subjective:   Gail Baker, am serving as a scribe for Dr. Hulan Saas.  I'm seeing this patient by the request  of:  Leone Haven, MD  CC: Bilateral knee pain follow-up  LEX:[id number]  Gail Baker is a 66 y.o. female coming in with complaint of B knee pain. PRP given on 10/24/2022. Patient states that her pain is not any better. 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        Past Medical History:  Diagnosis Date   Asthma    GERD (gastroesophageal reflux disease) 04/01/2019   H. pylori infection    History of UTI    Hyperlipidemia    Hypertension    Hyperthyroidism    Normocytic anemia 05/10/2020   Osteoporosis 01/02/2021   Pes anserinus bursitis of both knees 07/29/2018   Tubular adenoma of colon    Past Surgical History:  Procedure Laterality Date   BREAST BIOPSY Right 08/15/2017   FIBROADENOMATOID CHANGE    CESAREAN SECTION     Social History   Socioeconomic History   Marital status: Single    Spouse name: Not on file   Number of children: 1   Years of education: Not on file   Highest education level: Not on file  Occupational History   Not on file  Tobacco Use   Smoking status: Never   Smokeless tobacco: Never  Vaping Use   Vaping Use: Never used  Substance and Sexual Activity   Alcohol use: Baker    Alcohol/week: 0.0 standard drinks of alcohol   Drug use: Baker   Sexual activity: Not on file  Other Topics Concern   Not on file  Social History Narrative   Lives in [place]; Doctor, hospital at [employer]; 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    Social Determinants of Health   Financial Resource Strain: Not on file  Food Insecurity: Not on file  Transportation Needs: Not on file  Physical Activity: Not on file  Stress: Not on file  Social Connections: Not on  file   Baker Known Allergies Family History  Problem Relation Age of Onset   Hypertension Mother    Hypertension Father    Diabetes Father    Colon cancer Neg Hx    Colon polyps Neg Hx    Pancreatic cancer Neg Hx    Rectal cancer Neg Hx    Stomach cancer Neg Hx    Breast cancer Neg Hx     Current Outpatient Medications (Endocrine & Metabolic):    alendronate (FOSAMAX) 70 MG tablet, Take 1 tablet (70 mg total) by mouth every 7 (seven) days. Take with a full glass of water on an empty stomach.  Current Outpatient Medications (Cardiovascular):    amLODipine (NORVASC) 10 MG tablet, TAKE ONE TABLET BY MOUTH DAILY   carvedilol (COREG) 12.5 MG tablet, TAKE 1 TABLET BY MOUTH TWICE A DAY WITH MEALS   hydrochlorothiazide (HYDRODIURIL) 25 MG tablet, Take 1 tablet (25 mg total) by mouth daily.   rosuvastatin (CRESTOR) 10 MG tablet, TAKE ONE TABLET BY MOUTH DAILY   Current Outpatient Medications (Analgesics):    aspirin EC 81 MG tablet, Take 81 mg by mouth daily.   traMADol (ULTRAM) 50 MG tablet, TAKE ONE TABLET BY MOUTH EVERY 12 HOURS AS NEEDED   Current Outpatient Medications (  Other):    Cholecalciferol (VITAMIN D3) 125 MCG (5000 UT) TABS, Take by mouth. Pt takes 2 daily   Diclofenac Sodium (PENNSAID) 2 % SOLN, Place 2 g onto the skin 2 (two) times daily.   dicyclomine (BENTYL) 10 MG capsule, TAKE ONE CAPSULE BY MOUTH THREE TIMES A DAY AS NEEDED FOR CRAMPS   gabapentin (NEURONTIN) 100 MG capsule, Take 2 capsules (200 mg total) by mouth at bedtime.   gabapentin (NEURONTIN) 300 MG capsule, Take 1 capsule (300 mg total) by mouth every evening.   Probiotic Product (DIGESTIVE ADVANTAGE PO), Take by mouth daily.   Reviewed prior external information including notes and imaging from  primary care provider As well as notes that were available from care everywhere and other healthcare systems.  Past medical history, social, surgical and family history all reviewed in electronic medical record.   Baker pertanent information unless stated regarding to the chief complaint.   Review of Systems:  Baker headache, visual changes, nausea, vomiting, diarrhea, constipation, dizziness, abdominal pain, skin rash, fevers, chills, night sweats, weight loss, swollen lymph nodes, body aches, joint swelling, chest pain, shortness of breath, mood changes. POSITIVE muscle aches  Objective  Blood pressure 112/74, pulse 65, height '5\' 1"'$  (1.549 m), weight 195 lb (88.5 kg), SpO2 99 %.   General: Baker apparent distress alert and oriented x3 mood and affect normal, dressed appropriately.  HEENT: Pupils equal, extraocular movements intact  Respiratory: Patient's speak in full sentences and does not appear short of breath  Cardiovascular: Baker lower extremity edema, non tender, Baker erythema  Antalgic gait noted.  Does have of effusion noted over the knees bilaterally.  Seems to be limiting some flexion on the right knee.  Nontender to palpation diffusely noted.  After informed written and verbal consent, patient was seated on exam table. Right knee was prepped with alcohol swab and utilizing anterolateral approach, patient's right knee space was injected with 4:1  marcaine 0.5%: Kenalog '40mg'$ /dL. Patient tolerated the procedure well without immediate complications.  After informed written and verbal consent, patient was seated on exam table. Left knee was prepped with alcohol swab and utilizing anterolateral approach, patient's left knee space was injected with 4:1  marcaine 0.5%: Kenalog '40mg'$ /dL. Patient tolerated the procedure well without immediate complications.   Impression and Recommendations:    The above documentation has been reviewed and is accurate and complete Lyndal Pulley, DO

## 2022-12-20 ENCOUNTER — Encounter: Payer: Self-pay | Admitting: Family Medicine

## 2022-12-20 NOTE — Telephone Encounter (Signed)
Can someone call her insurance at the number in the picture she provided and see how I need to change this medication? It is only prescribed for 50 mg BID prn and it was last filled in May. I'm not sure how I could reduce that anymore.

## 2022-12-21 NOTE — Telephone Encounter (Signed)
Noted.  We can try to do a PA for this once it hits 2024.

## 2022-12-21 NOTE — Telephone Encounter (Signed)
I called the number on her insurance card and the representative stated that for the new year 2024 if the patient is prescribed more than 7 pilss of the tramadol she would need a PA this is a letter she received about the change for 2024.  Gavon Majano,cma

## 2023-01-02 ENCOUNTER — Other Ambulatory Visit: Payer: Self-pay

## 2023-01-02 NOTE — Telephone Encounter (Signed)
Pt called back. Gae Bon was aware and said its was going to send it over.

## 2023-02-11 IMAGING — DX DG KNEE STANDING AP BILAT
1 series · 1 of 1 positions shown · non-contrast
Comparison: None.

CLINICAL DATA: Chronic bilateral knee pain.

EXAM:
BILATERAL KNEES STANDING - 1 VIEW

[knee ap]
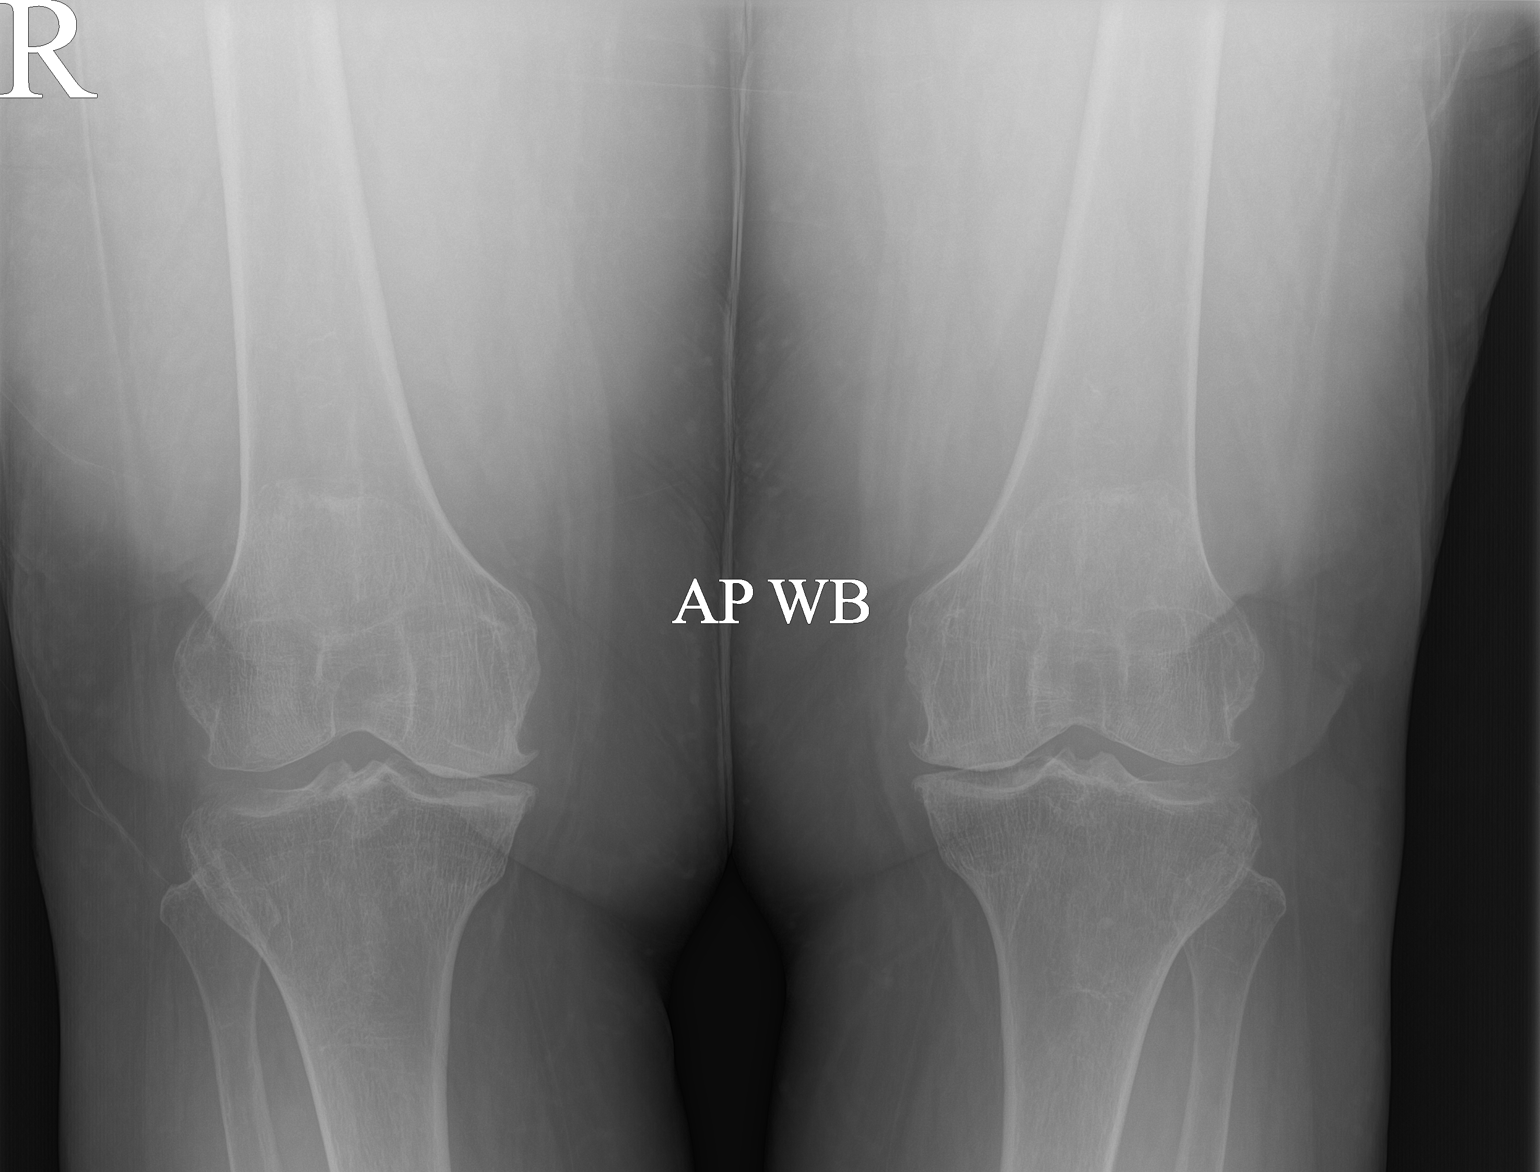

[1 of 1 positions shown; findings below may reference images not displayed]

FINDINGS: No acute bony or joint abnormality is identified. There is mild
medial compartment joint space narrowing. Lateral compartment joint
spaces are preserved. Osteophytosis about the medial and lateral
compartments is worse on the medial side. No chondrocalcinosis is
seen. Soft tissues are negative.
IMPRESSION: Mild-to-moderate medial and lateral compartment osteoarthritis
appears worse in the medial compartment of both knees.

## 2023-02-22 ENCOUNTER — Encounter: Payer: Self-pay | Admitting: Family Medicine

## 2023-02-26 ENCOUNTER — Other Ambulatory Visit: Payer: Self-pay

## 2023-02-26 DIAGNOSIS — M810 Age-related osteoporosis without current pathological fracture: Secondary | ICD-10-CM

## 2023-02-26 NOTE — Progress Notes (Unsigned)
Patient requesting refill on Fosamax-order is pended for your review

## 2023-02-27 ENCOUNTER — Ambulatory Visit: Payer: Managed Care, Other (non HMO) | Admitting: Family Medicine

## 2023-03-07 NOTE — Progress Notes (Signed)
Gail Baker Gail Baker Phone: [phone] Subjective:   [name], am serving as a scribe for Dr. Hulan Saas.  I'm seeing this patient by the request  of:  Leone Haven, MD  CC: Bilateral knee pain  [id number]  12/19/2022 Chronic, with worsening symptoms.  Patient has been going on now nearly 2 years of injections.  Has not worsened at all and has actually increased some of her activity but she still has chronic problem with exacerbation.  Can affect daily activities.  This with patient about icing regimen and home exercises.  Increase activity slowly.  Follow-up again in 8 weeks due to financial constraints patient would not be able to take time off for surgical intervention.      Update 03/08/2023 Gail Baker is a 67 y.o. female coming in with complaint of B knee pain. Patient states that today she is doing ok. 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   Also c/o L shoulder pain with weather changes. Pain In front for her shoulder that is radiating down her arm. Pain has subsided.       Past Medical History:  Diagnosis Date   Asthma    GERD (gastroesophageal reflux disease) 04/01/2019   H. pylori infection    History of UTI    Hyperlipidemia    Hypertension    Hyperthyroidism    Normocytic anemia 05/10/2020   Osteoporosis 01/02/2021   Pes anserinus bursitis of both knees 07/29/2018   Tubular adenoma of colon    Past Surgical History:  Procedure Laterality Date   BREAST BIOPSY Right 08/15/2017   FIBROADENOMATOID CHANGE    CESAREAN SECTION     Social History   Socioeconomic History   Marital status: Single    Spouse name: Not on file   Number of children: 1   Years of education: Not on file   Highest education level: Not on file  Occupational History   Not on file  Tobacco Use   Smoking status: Never   Smokeless tobacco: Never  Vaping Use    Vaping Use: Never used  Substance and Sexual Activity   Alcohol use: No    Alcohol/week: 0.0 standard drinks of alcohol   Drug use: No   Sexual activity: Not on file  Other Topics Concern   Not on file  Social History Narrative   Lives in [place]; Doctor, hospital at [employer]; 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    Social Determinants of Health   Financial Resource Strain: Not on file  Food Insecurity: Not on file  Transportation Needs: Not on file  Physical Activity: Not on file  Stress: Not on file  Social Connections: Not on file   No Known Allergies Family History  Problem Relation Age of Onset   Hypertension Mother    Hypertension Father    Diabetes Father    Colon cancer Neg Hx    Colon polyps Neg Hx    Pancreatic cancer Neg Hx    Rectal cancer Neg Hx    Stomach cancer Neg Hx    Breast cancer Neg Hx     Current Outpatient Medications (Endocrine & Metabolic):    alendronate (FOSAMAX) 70 MG tablet, Take 1 tablet (70 mg total) by mouth every 7 (seven) days. Take with a full glass of water on an empty stomach.  Current Outpatient Medications (Cardiovascular):    amLODipine (NORVASC) 10 MG  tablet, TAKE ONE TABLET BY MOUTH DAILY   carvedilol (COREG) 12.5 MG tablet, TAKE 1 TABLET BY MOUTH TWICE A DAY WITH MEALS   hydrochlorothiazide (HYDRODIURIL) 25 MG tablet, Take 1 tablet (25 mg total) by mouth daily.   rosuvastatin (CRESTOR) 10 MG tablet, TAKE ONE TABLET BY MOUTH DAILY   Current Outpatient Medications (Analgesics):    aspirin EC 81 MG tablet, Take 81 mg by mouth daily.   traMADol (ULTRAM) 50 MG tablet, TAKE ONE TABLET BY MOUTH EVERY 12 HOURS AS NEEDED   Current Outpatient Medications (Other):    Cholecalciferol (VITAMIN D3) 125 MCG (5000 UT) TABS, Take by mouth. Pt takes 2 daily   Diclofenac Sodium (PENNSAID) 2 % SOLN, Place 2 g onto the skin 2 (two) times daily.   dicyclomine (BENTYL) 10 MG capsule, TAKE ONE CAPSULE BY MOUTH THREE TIMES A DAY AS NEEDED FOR  CRAMPS   gabapentin (NEURONTIN) 100 MG capsule, Take 2 capsules (200 mg total) by mouth at bedtime.   gabapentin (NEURONTIN) 300 MG capsule, Take 1 capsule (300 mg total) by mouth every evening.   Probiotic Product (DIGESTIVE ADVANTAGE PO), Take by mouth daily.   Reviewed prior external information including notes and imaging from  primary care provider As well as notes that were available from care everywhere and other healthcare systems.  Past medical history, social, surgical and family history all reviewed in electronic medical record.  No pertanent information unless stated regarding to the chief complaint.   Review of Systems:  No headache, visual changes, nausea, vomiting, diarrhea, constipation, dizziness, abdominal pain, skin rash, fevers, chills, night sweats, weight loss, swollen lymph nodes, body aches, joint swelling, chest pain, shortness of breath, mood changes. POSITIVE muscle aches  Objective  Blood pressure 116/84, pulse 74, height '5\' 1"'$  (1.549 m), weight 193 lb (87.5 kg), SpO2 98 %.   General: No apparent distress alert and oriented x3 mood and affect normal, dressed appropriately.  HEENT: Pupils equal, extraocular movements intact  Respiratory: Patient's speak in full sentences and does not appear short of breath  Cardiovascular: No lower extremity edema, non tender, no erythema  Mild impingement of the left shoulder.  Patient's knees bilaterally do show some instability noted.  No crepitus noted.  Trace effusion noted of the patellofemoral joint.  After informed written and verbal consent, patient was seated on exam table. Right knee was prepped with alcohol swab and utilizing anterolateral approach, patient's right knee space was injected with 4:1  marcaine 0.5%: Kenalog '40mg'$ /dL. Patient tolerated the procedure well without immediate complications.  After informed written and verbal consent, patient was seated on exam table. Left knee was prepped with alcohol swab and  utilizing anterolateral approach, patient's left knee space was injected with 4:1  marcaine 0.5%: Kenalog '40mg'$ /dL. Patient tolerated the procedure well without immediate complications.   Impression and Recommendations:     The above documentation has been reviewed and is accurate and complete Lyndal Pulley, DO

## 2023-03-08 ENCOUNTER — Encounter: Payer: Self-pay | Admitting: Family Medicine

## 2023-03-08 ENCOUNTER — Ambulatory Visit (INDEPENDENT_AMBULATORY_CARE_PROVIDER_SITE_OTHER): Payer: Managed Care, Other (non HMO) | Admitting: Family Medicine

## 2023-03-08 VITALS — BP 116/84 | HR 74 | Ht 61.0 in | Wt 193.0 lb

## 2023-03-08 DIAGNOSIS — M170 Bilateral primary osteoarthritis of knee: Secondary | ICD-10-CM

## 2023-03-08 NOTE — Assessment & Plan Note (Signed)
Repeat injection of the knees bilaterally.  Still wants to hold off for any type of surgical intervention.  Discussed icing regimen and home exercises.  Discussed this worsening pain, patient's BMI is near 35 and encouraged her to continue to try to lose weight.  Follow-up with me again in 6 to 8 weeks otherwise.

## 2023-03-08 NOTE — Patient Instructions (Signed)
Good to see you Both knees injected today  Follow up in 10 weeks

## 2023-03-29 ENCOUNTER — Other Ambulatory Visit: Payer: Self-pay | Admitting: Family Medicine

## 2023-03-29 DIAGNOSIS — I10 Essential (primary) hypertension: Secondary | ICD-10-CM

## 2023-04-01 ENCOUNTER — Telehealth: Payer: Self-pay | Admitting: Family Medicine

## 2023-04-01 DIAGNOSIS — I10 Essential (primary) hypertension: Secondary | ICD-10-CM

## 2023-04-02 ENCOUNTER — Other Ambulatory Visit: Payer: Self-pay

## 2023-04-02 DIAGNOSIS — I10 Essential (primary) hypertension: Secondary | ICD-10-CM

## 2023-04-02 MED ORDER — HYDROCHLOROTHIAZIDE 25 MG PO TABS: 25.0000 mg | ORAL_TABLET | Freq: Every day | ORAL | 3 refills | Status: DC

## 2023-04-02 MED ORDER — AMLODIPINE BESYLATE 10 MG PO TABS: 10.0000 mg | ORAL_TABLET | Freq: Every day | ORAL | 1 refills | Status: DC

## 2023-04-02 NOTE — Telephone Encounter (Signed)
Pt need a refill on amLODipine and hydrochlorothiazide sent to [facility]

## 2023-04-02 NOTE — Telephone Encounter (Signed)
Prescriptions sent to [facility].  

## 2023-04-04 ENCOUNTER — Other Ambulatory Visit: Payer: Self-pay | Admitting: Family Medicine

## 2023-04-04 DIAGNOSIS — M810 Age-related osteoporosis without current pathological fracture: Secondary | ICD-10-CM

## 2023-04-10 ENCOUNTER — Ambulatory Visit: Payer: Managed Care, Other (non HMO) | Admitting: Family Medicine

## 2023-04-22 ENCOUNTER — Ambulatory Visit: Payer: Managed Care, Other (non HMO) | Admitting: Family Medicine

## 2023-05-02 ENCOUNTER — Encounter: Payer: Self-pay | Admitting: Family Medicine

## 2023-05-02 NOTE — Progress Notes (Signed)
[facility] [address] [place] [address] Phone: [phone] Subjective:   Bruce Donath, am serving as a scribe for Dr. Antoine Primas.  I'm seeing this patient by the request  of:  Glori Luis, MD  CC: Bilateral knee pain  [id number]  03/08/2023 Repeat injection of the knees bilaterally.  Still wants to hold off for any type of surgical intervention.  Discussed icing regimen and home exercises.  Discussed this worsening pain, patient's BMI is near 35 and encouraged her to continue to try to lose weight.  Follow-up with me again in 6 to 8 weeks otherwise.      Update 05/03/2023 Gail Baker is a 67 y.o. female coming in with complaint of B knee pain. Patient states that both knees are bothering her again.  Patient states having increasing instability.  Movement is cold it seems to be worse.  Patient is looking forward to summertime because of this.  Affecting daily activities       Past Medical History:  Diagnosis Date   Asthma    GERD (gastroesophageal reflux disease) 04/01/2019   H. pylori infection    History of UTI    Hyperlipidemia    Hypertension    Hyperthyroidism    Normocytic anemia 05/10/2020   Osteoporosis 01/02/2021   Pes anserinus bursitis of both knees 07/29/2018   Tubular adenoma of colon    Past Surgical History:  Procedure Laterality Date   BREAST BIOPSY Right 08/15/2017   FIBROADENOMATOID CHANGE    CESAREAN SECTION     Social History   Socioeconomic History   Marital status: Single    Spouse name: Not on file   Number of children: 1   Years of education: Not on file   Highest education level: Not on file  Occupational History   Not on file  Tobacco Use   Smoking status: Never   Smokeless tobacco: Never  Vaping Use   Vaping Use: Never used  Substance and Sexual Activity   Alcohol use: No    Alcohol/week: 0.0 standard drinks of alcohol   Drug use: No   Sexual activity: Not on file   Other Topics Concern   Not on file  Social History Narrative   Lives in [place]; Equities trader at [employer]; from [place]. No smoking or alcohol.    Social Determinants of Health   Financial Resource Strain: Not on file  Food Insecurity: Not on file  Transportation Needs: Not on file  Physical Activity: Not on file  Stress: Not on file  Social Connections: Not on file   No Known Allergies Family History  Problem Relation Age of Onset   Hypertension Mother    Hypertension Father    Diabetes Father    Colon cancer Neg Hx    Colon polyps Neg Hx    Pancreatic cancer Neg Hx    Rectal cancer Neg Hx    Stomach cancer Neg Hx    Breast cancer Neg Hx     Current Outpatient Medications (Endocrine & Metabolic):    alendronate (FOSAMAX) 70 MG tablet, TAKE 1 TABLET BY MOUTH ONCE WEEKLY ON AN EMPTY STOMACH BEFORE BREAKFAST. REMAIN UPRIGHT FOR 30 MINUTES AND TAKE WITH 8 OUNCES OF WATER  Current Outpatient Medications (Cardiovascular):    amLODipine (NORVASC) 10 MG tablet, Take 1 tablet (10 mg total) by mouth daily.   carvedilol (COREG) 12.5 MG tablet, TAKE 1 TABLET BY MOUTH TWICE A DAY WITH MEALS  hydrochlorothiazide (HYDRODIURIL) 25 MG tablet, Take 1 tablet (25 mg total) by mouth daily.   rosuvastatin (CRESTOR) 10 MG tablet, TAKE ONE TABLET BY MOUTH DAILY   Current Outpatient Medications (Analgesics):    aspirin EC 81 MG tablet, Take 81 mg by mouth daily.   traMADol (ULTRAM) 50 MG tablet, TAKE ONE TABLET BY MOUTH EVERY 12 HOURS AS NEEDED   Current Outpatient Medications (Other):    Cholecalciferol (VITAMIN D3) 125 MCG (5000 UT) TABS, Take by mouth. Pt takes 2 daily   Diclofenac Sodium (PENNSAID) 2 % SOLN, Place 2 g onto the skin 2 (two) times daily.   dicyclomine (BENTYL) 10 MG capsule, TAKE ONE CAPSULE BY MOUTH THREE TIMES A DAY AS NEEDED FOR CRAMPS   gabapentin (NEURONTIN) 100 MG capsule, Take 2 capsules (200 mg total) by mouth at bedtime.   gabapentin (NEURONTIN) 300 MG  capsule, Take 1 capsule (300 mg total) by mouth every evening.   Probiotic Product (DIGESTIVE ADVANTAGE PO), Take by mouth daily.   Reviewed prior external information including notes and imaging from  primary care provider As well as notes that were available from care everywhere and other healthcare systems.  Past medical history, social, surgical and family history all reviewed in electronic medical record.  No pertanent information unless stated regarding to the chief complaint.   Review of Systems:  No headache, visual changes, nausea, vomiting, diarrhea, constipation, dizziness, abdominal pain, skin rash, fevers, chills, night sweats, weight loss, swollen lymph nodes, body aches, joint swelling, chest pain, shortness of breath, mood changes. POSITIVE muscle aches  Objective  Blood pressure 122/84, pulse 72, height 5\' 1"  (1.549 m), weight 195 lb (88.5 kg), SpO2 98 %.   General: No apparent distress alert and oriented x3 mood and affect normal, dressed appropriately.  HEENT: Pupils equal, extraocular movements intact  Respiratory: Patient's speak in full sentences and does not appear short of breath  Cardiovascular: No lower extremity edema, non tender, no erythema  Antalgic gait noted. Knees bilaterally do have significant instability noted.  Patient does have crepitus of the knees.  Lacks last 10 degrees of flexion of the knees bilaterally.  Lateral tracking of the patella  After informed written and verbal consent, patient was seated on exam table. Right knee was prepped with alcohol swab and utilizing anterolateral approach, patient's right knee space was injected with 4:1  marcaine 0.5%: Kenalog 40mg /dL. Patient tolerated the procedure well without immediate complications.  After informed written and verbal consent, patient was seated on exam table. Left knee was prepped with alcohol swab and utilizing anterolateral approach, patient's left knee space was injected with 4:1  marcaine  0.5%: Kenalog 40mg /dL. Patient tolerated the procedure well without immediate complications.   Impression and Recommendations:     The above documentation has been reviewed and is accurate and complete Judi Saa, DO

## 2023-05-03 ENCOUNTER — Ambulatory Visit (INDEPENDENT_AMBULATORY_CARE_PROVIDER_SITE_OTHER): Payer: Managed Care, Other (non HMO) | Admitting: Family Medicine

## 2023-05-03 VITALS — BP 122/84 | HR 72 | Ht 61.0 in | Wt 195.0 lb

## 2023-05-03 DIAGNOSIS — M170 Bilateral primary osteoarthritis of knee: Secondary | ICD-10-CM | POA: Diagnosis not present

## 2023-05-03 NOTE — Assessment & Plan Note (Signed)
Chronic problem with exacerbation.  Discussed icing regimen and home exercises.  Discussed which activities to do and which ones to avoid.  Increase activity slowly.  Patient is going to continue to work on weight loss.  Working on getting BMI under 35 so patient would be a surgical candidate if necessary.  Still wants to avoid that for a long duration.  Will continue to work at this time.  Follow-up again with 8 to 10 weeks

## 2023-05-03 NOTE — Patient Instructions (Signed)
Injected both knees today See me in 2-3 months

## 2023-05-15 ENCOUNTER — Ambulatory Visit: Payer: Managed Care, Other (non HMO) | Admitting: Family Medicine

## 2023-05-28 ENCOUNTER — Other Ambulatory Visit: Payer: Self-pay | Admitting: Family Medicine

## 2023-05-28 DIAGNOSIS — M810 Age-related osteoporosis without current pathological fracture: Secondary | ICD-10-CM

## 2023-06-11 ENCOUNTER — Ambulatory Visit: Payer: Managed Care, Other (non HMO) | Admitting: Family Medicine

## 2023-07-17 ENCOUNTER — Ambulatory Visit: Payer: Managed Care, Other (non HMO) | Admitting: Family Medicine

## 2023-07-24 ENCOUNTER — Ambulatory Visit: Payer: Managed Care, Other (non HMO) | Admitting: Family Medicine

## 2023-07-24 ENCOUNTER — Encounter: Payer: Self-pay | Admitting: Family Medicine

## 2023-07-24 VITALS — BP 132/84 | HR 71 | Temp 98.7°F | Ht 61.0 in | Wt 197.4 lb

## 2023-07-24 DIAGNOSIS — I700 Atherosclerosis of aorta: Secondary | ICD-10-CM | POA: Diagnosis not present

## 2023-07-24 DIAGNOSIS — I4949 Other premature depolarization: Secondary | ICD-10-CM | POA: Insufficient documentation

## 2023-07-24 DIAGNOSIS — M810 Age-related osteoporosis without current pathological fracture: Secondary | ICD-10-CM

## 2023-07-24 DIAGNOSIS — E785 Hyperlipidemia, unspecified: Secondary | ICD-10-CM

## 2023-07-24 DIAGNOSIS — M170 Bilateral primary osteoarthritis of knee: Secondary | ICD-10-CM | POA: Diagnosis not present

## 2023-07-24 DIAGNOSIS — D709 Neutropenia, unspecified: Secondary | ICD-10-CM

## 2023-07-24 DIAGNOSIS — I10 Essential (primary) hypertension: Secondary | ICD-10-CM | POA: Diagnosis not present

## 2023-07-24 DIAGNOSIS — E739 Lactose intolerance, unspecified: Secondary | ICD-10-CM

## 2023-07-24 LAB — VITAMIN D 25 HYDROXY (VIT D DEFICIENCY, FRACTURES): VITD: 64.89 ng/mL (ref 30.00–100.00)

## 2023-07-24 LAB — COMPREHENSIVE METABOLIC PANEL
ALT: 11 U/L (ref 0–35)
AST: 18 U/L (ref 0–37)
Albumin: 4 g/dL (ref 3.5–5.2)
Alkaline Phosphatase: 46 U/L (ref 39–117)
BUN: 12 mg/dL (ref 6–23)
CO2: 28 mEq/L (ref 19–32)
Calcium: 9.2 mg/dL (ref 8.4–10.5)
Chloride: 106 mEq/L (ref 96–112)
Creatinine, Ser: 0.65 mg/dL (ref 0.40–1.20)
GFR: 90.42 mL/min (ref >60.00)
Glucose, Bld: 84 mg/dL (ref 70–99)
Potassium: 3.5 mEq/L (ref 3.5–5.1)
Sodium: 141 mEq/L (ref 135–145)
Total Bilirubin: 0.5 mg/dL (ref 0.2–1.2)
Total Protein: 7.2 g/dL (ref 6.0–8.3)

## 2023-07-24 LAB — CBC WITH DIFFERENTIAL/PLATELET
Basophils Absolute: 0 10*3/uL (ref 0.0–0.1)
Basophils Relative: 0.3 % (ref 0.0–3.0)
Eosinophils Absolute: 0.2 10*3/uL (ref 0.0–0.7)
Eosinophils Relative: 8.1 % — ABNORMAL HIGH (ref 0.0–5.0)
HCT: 35.4 % — ABNORMAL LOW (ref 36.0–46.0)
Hemoglobin: 11.9 g/dL — ABNORMAL LOW (ref 12.0–15.0)
Lymphocytes Relative: 36 % (ref 12.0–46.0)
Lymphs Abs: 1.1 10*3/uL (ref 0.7–4.0)
MCHC: 33.5 g/dL (ref 30.0–36.0)
MCV: 88.1 fl (ref 78.0–100.0)
Monocytes Absolute: 0.5 10*3/uL (ref 0.1–1.0)
Monocytes Relative: 15.6 % — ABNORMAL HIGH (ref 3.0–12.0)
Neutro Abs: 1.2 10*3/uL — ABNORMAL LOW (ref 1.4–7.7)
Neutrophils Relative %: 40 % — ABNORMAL LOW (ref 43.0–77.0)
Platelets: 233 10*3/uL (ref 150.0–400.0)
RBC: 4.02 Mil/uL (ref 3.87–5.11)
RDW: 13.8 % (ref 11.5–15.5)
WBC: 3 10*3/uL — ABNORMAL LOW (ref 4.0–10.5)

## 2023-07-24 LAB — LDL CHOLESTEROL, DIRECT: Direct LDL: 59 mg/dL

## 2023-07-24 LAB — HEMOGLOBIN A1C: Hgb A1c MFr Bld: 5.3 % (ref 4.6–6.5)

## 2023-07-24 MED ORDER — ALENDRONATE SODIUM 70 MG PO TABS
ORAL_TABLET | ORAL | 3 refills | Status: DC
Start: 2023-07-24 — End: 2024-07-28

## 2023-07-24 MED ORDER — TRAMADOL HCL 50 MG PO TABS: 50.0000 mg | ORAL_TABLET | Freq: Two times a day (BID) | ORAL | 0 refills | Status: DC | PRN

## 2023-07-24 MED ORDER — HYDROCHLOROTHIAZIDE 25 MG PO TABS: 25.0000 mg | ORAL_TABLET | Freq: Every day | ORAL | 3 refills | Status: DC

## 2023-07-24 NOTE — Assessment & Plan Note (Signed)
Chronic issue.  She will continue to see Dr. Katrinka Blazing.  She can continue tramadol 50 mg every 12 hours as needed.  Controlled substance database reviewed.  Refill provided.

## 2023-07-24 NOTE — Assessment & Plan Note (Addendum)
Chronic issue.  Continue Fosamax 70 mg once weekly.  She will continue vitamin D 2000 international units daily.  We will have her complete a bone density scan.  Will also check vitamin D today.  Patient notes she needs to call to schedule bone density scan herself.

## 2023-07-24 NOTE — Progress Notes (Addendum)
Marikay Alar, MD Phone: [phone]  Gail Baker is a 67 y.o. female who presents today for f/u.  HYPERTENSION Disease Monitoring Home BP Monitoring <130/80 Chest pain- no    Dyspnea- no Medications Compliance-  taking hydrochlorothiazide, amlodipine, coreg. Edema- no BMET    Component Value Date/Time   NA 138 10/17/2022 1048   K 3.5 10/17/2022 1048   CL 103 10/17/2022 1048   CO2 29 10/17/2022 1048   GLUCOSE 104 (H) 10/17/2022 1048   BUN 10 10/17/2022 1048   CREATININE 0.65 10/17/2022 1048   CALCIUM 9.3 10/17/2022 1048   GFRNONAA >60 05/10/2020 1208   GFRAA >60 05/10/2020 1208   HYPERLIPIDEMIA Symptoms Chest pain on exertion:  no   Medications: Compliance- taking crestor Right upper quadrant pain- no  Muscle aches- no Lipid Panel     Component Value Date/Time   CHOL 143 10/17/2022 1048   TRIG 32.0 10/17/2022 1048   HDL 73.80 10/17/2022 1048   CHOLHDL 2 10/17/2022 1048   VLDL 6.4 10/17/2022 1048   LDLCALC 62 10/17/2022 1048   LDLDIRECT 61.0 03/12/2018 0933   Arthritis: Patient has chronic knee pain related to arthritis.  She very rarely takes tramadol.  She notes she only takes this when absolutely needed.  No drowsiness with this.  She does follow with sports medicine.  Osteoporosis: Patient notes no fractures.  She is on Fosamax and vitamin D 2000 international units once daily.  Lactose intolerance: Patient notes her stomach rumbles and she does have some gas with this.  She is been able to tie it to milk intake.  No diarrhea.  She has 1 bowel movement daily.  Social History   Tobacco Use  Smoking Status Never  Smokeless Tobacco Never    Current Outpatient Medications on File Prior to Visit  Medication Sig Dispense Refill   amLODipine (NORVASC) 10 MG tablet Take 1 tablet (10 mg total) by mouth daily. 90 tablet 1   aspirin EC 81 MG tablet Take 81 mg by mouth daily.     carvedilol (COREG) 12.5 MG tablet TAKE 1 TABLET BY MOUTH TWICE A DAY WITH MEALS  180 tablet 3   Cholecalciferol (VITAMIN D3) 125 MCG (5000 UT) TABS Take by mouth. Pt takes 2 daily     Diclofenac Sodium (PENNSAID) 2 % SOLN Place 2 g onto the skin 2 (two) times daily. 112 g 3   dicyclomine (BENTYL) 10 MG capsule TAKE ONE CAPSULE BY MOUTH THREE TIMES A DAY AS NEEDED FOR CRAMPS 90 capsule 1   Probiotic Product (DIGESTIVE ADVANTAGE PO) Take by mouth daily.     rosuvastatin (CRESTOR) 10 MG tablet TAKE ONE TABLET BY MOUTH DAILY 90 tablet 3   No current facility-administered medications on file prior to visit.     ROS see history of present illness  Objective  Physical Exam Vitals:   07/24/23 0802 07/24/23 0811  BP: 138/86 132/84  Pulse: 71   Temp: 98.7 F (37.1 C)   SpO2: 99%     BP Readings from Last 3 Encounters:  07/24/23 132/84  05/03/23 122/84  03/08/23 116/84   Wt Readings from Last 3 Encounters:  07/24/23 197 lb 6.4 oz (89.5 kg)  05/03/23 195 lb (88.5 kg)  03/08/23 193 lb (87.5 kg)    Physical Exam Constitutional:      General: She is not in acute distress.    Appearance: She is not diaphoretic.  Cardiovascular:     Rate and Rhythm: Normal rate.  Heart sounds: Normal heart sounds.     Comments: Regular rhythm with occasional ectopic beats Pulmonary:     Effort: Pulmonary effort is normal.     Breath sounds: Normal breath sounds.  Musculoskeletal:     Right lower leg: No edema.     Left lower leg: No edema.  Skin:    General: Skin is warm and dry.  Neurological:     Mental Status: She is alert.      Assessment/Plan: Please see individual problem list.  Primary hypertension Assessment & Plan: Chronic issue.  At goal at home.  She will continue HCTZ 25 mg daily, carvedilol 12.5 mg twice daily, and amlodipine 10 mg daily.  Orders: -     hydroCHLOROthiazide; Take 1 tablet (25 mg total) by mouth daily.  Dispense: 30 tablet; Refill: 3 -     Comprehensive metabolic panel -     Hemoglobin A1c  Hyperlipidemia, unspecified  hyperlipidemia type Assessment & Plan: Chronic issue.  Continue Crestor 10 mg daily.  Check direct LDL.  Orders: -     Comprehensive metabolic panel -     LDL cholesterol, direct  Primary osteoarthritis of both knees Assessment & Plan: Chronic issue.  She will continue to see Dr. Katrinka Blazing.  She can continue tramadol 50 mg every 12 hours as needed.  Controlled substance database reviewed.  Refill provided.  Orders: -     traMADol HCl; Take 1 tablet (50 mg total) by mouth every 12 (twelve) hours as needed.  Dispense: 30 tablet; Refill: 0  Atherosclerosis of aorta (HCC) Assessment & Plan: Chronic issue.  Continue risk factor management.   Age-related osteoporosis without current pathological fracture Assessment & Plan: Chronic issue.  Continue Fosamax 70 mg once weekly.  She will continue vitamin D 2000 international units daily.  We will have her complete a bone density scan.  Will also check vitamin D today.  Patient notes she needs to call to schedule bone density scan herself.  Orders: -     Alendronate Sodium; Take with a full glass of water on an empty stomach.TAKE 1 TABLET BY MOUTH ONCE WEEKLY ON AN EMPTY STOMACH BEFORE BREAKFAST. REMAIN UPRIGHT FOR 30 MINUTES AND TAKE WITH 8 OUNCES OF WATER  Dispense: 12 tablet; Refill: 3 -     DG Bone Density; Future -     VITAMIN D 25 Hydroxy (Vit-D Deficiency, Fractures)  Lactose intolerance Assessment & Plan: Chronic issue.  Encouraged trial of Lactaid milk or Lactaid pills.   Neutropenia, unspecified type (HCC) -     CBC with Differential/Platelet  Ectopic heartbeats Assessment & Plan: Likely PVCs.  Patient denies symptoms.  She will monitor for palpitations or any other symptoms.      Return in about 6 months (around 01/24/2024).   Marikay Alar, MD [facility]

## 2023-07-24 NOTE — Assessment & Plan Note (Signed)
Chronic issue.  Continue risk factor management. 

## 2023-07-24 NOTE — Assessment & Plan Note (Signed)
Chronic issue.  Encouraged trial of Lactaid milk or Lactaid pills.

## 2023-07-24 NOTE — Assessment & Plan Note (Signed)
Chronic issue.  Continue Crestor 10 mg daily.  Check direct LDL.

## 2023-07-24 NOTE — Assessment & Plan Note (Signed)
Likely PVCs.  Patient denies symptoms.  She will monitor for palpitations or any other symptoms.

## 2023-07-24 NOTE — Assessment & Plan Note (Signed)
Chronic issue.  At goal at home.  She will continue HCTZ 25 mg daily, carvedilol 12.5 mg twice daily, and amlodipine 10 mg daily.

## 2023-07-24 NOTE — Patient Instructions (Signed)
Nice to see you. Please call [phone] to schedule your bone density scan. We will contact you with your lab results.

## 2023-08-26 ENCOUNTER — Ambulatory Visit: Payer: Managed Care, Other (non HMO) | Admitting: Family Medicine

## 2023-09-29 ENCOUNTER — Other Ambulatory Visit: Payer: Self-pay | Admitting: Family Medicine

## 2023-09-29 DIAGNOSIS — I10 Essential (primary) hypertension: Secondary | ICD-10-CM

## 2023-10-16 ENCOUNTER — Ambulatory Visit
Admission: RE | Admit: 2023-10-16 | Discharge: 2023-10-16 | Disposition: A | Discharge status: Home / Self Care | Payer: Managed Care, Other (non HMO) | Source: Ambulatory Visit | Attending: Family Medicine | Admitting: Family Medicine

## 2023-10-16 DIAGNOSIS — M810 Age-related osteoporosis without current pathological fracture: Secondary | ICD-10-CM | POA: Insufficient documentation

## 2023-10-23 ENCOUNTER — Other Ambulatory Visit: Payer: Self-pay | Admitting: Family Medicine

## 2023-10-23 DIAGNOSIS — I10 Essential (primary) hypertension: Secondary | ICD-10-CM

## 2023-11-26 ENCOUNTER — Ambulatory Visit: Payer: Managed Care, Other (non HMO) | Admitting: Physician Assistant

## 2023-12-06 ENCOUNTER — Other Ambulatory Visit: Payer: Self-pay | Admitting: Family Medicine

## 2023-12-06 DIAGNOSIS — Z1231 Encounter for screening mammogram for malignant neoplasm of breast: Secondary | ICD-10-CM

## 2023-12-31 ENCOUNTER — Other Ambulatory Visit: Payer: Self-pay | Admitting: Family Medicine

## 2024-01-08 ENCOUNTER — Ambulatory Visit
Admission: RE | Admit: 2024-01-08 | Discharge: 2024-01-08 | Disposition: A | Discharge status: Home / Self Care | Payer: Managed Care, Other (non HMO) | Source: Ambulatory Visit | Attending: Family Medicine | Admitting: Family Medicine

## 2024-01-08 DIAGNOSIS — Z1231 Encounter for screening mammogram for malignant neoplasm of breast: Secondary | ICD-10-CM | POA: Insufficient documentation

## 2024-01-29 ENCOUNTER — Other Ambulatory Visit: Payer: Self-pay | Admitting: Family Medicine

## 2024-01-29 ENCOUNTER — Ambulatory Visit: Payer: Managed Care, Other (non HMO) | Admitting: Family Medicine

## 2024-01-29 ENCOUNTER — Encounter: Payer: Self-pay | Admitting: Family Medicine

## 2024-01-29 VITALS — BP 124/72 | HR 82 | Temp 99.1°F | Resp 18 | Ht 61.0 in | Wt 191.5 lb

## 2024-01-29 DIAGNOSIS — K219 Gastro-esophageal reflux disease without esophagitis: Secondary | ICD-10-CM

## 2024-01-29 DIAGNOSIS — E785 Hyperlipidemia, unspecified: Secondary | ICD-10-CM | POA: Diagnosis not present

## 2024-01-29 DIAGNOSIS — M170 Bilateral primary osteoarthritis of knee: Secondary | ICD-10-CM | POA: Diagnosis not present

## 2024-01-29 DIAGNOSIS — I10 Essential (primary) hypertension: Secondary | ICD-10-CM

## 2024-01-29 DIAGNOSIS — E876 Hypokalemia: Secondary | ICD-10-CM

## 2024-01-29 LAB — COMPREHENSIVE METABOLIC PANEL
ALT: 11 U/L (ref 0–35)
AST: 21 U/L (ref 0–37)
Albumin: 4 g/dL (ref 3.5–5.2)
Alkaline Phosphatase: 58 U/L (ref 39–117)
BUN: 12 mg/dL (ref 6–23)
CO2: 29 meq/L (ref 19–32)
Calcium: 9.1 mg/dL (ref 8.4–10.5)
Chloride: 106 meq/L (ref 96–112)
Creatinine, Ser: 0.66 mg/dL (ref 0.40–1.20)
GFR: 89.77 mL/min (ref >60.00)
Glucose, Bld: 80 mg/dL (ref 70–99)
Potassium: 3.3 meq/L — ABNORMAL LOW (ref 3.5–5.1)
Sodium: 141 meq/L (ref 135–145)
Total Bilirubin: 0.5 mg/dL (ref 0.2–1.2)
Total Protein: 7.2 g/dL (ref 6.0–8.3)

## 2024-01-29 LAB — LIPID PANEL
Cholesterol: 129 mg/dL (ref 0–200)
HDL: 63.1 mg/dL (ref >39.00)
LDL Cholesterol: 57 mg/dL (ref 0–99)
NonHDL: 65.57
Total CHOL/HDL Ratio: 2
Triglycerides: 43 mg/dL (ref 0.0–149.0)
VLDL: 8.6 mg/dL (ref 0.0–40.0)

## 2024-01-29 MED ORDER — CARVEDILOL 12.5 MG PO TABS: 12.5000 mg | ORAL_TABLET | Freq: Two times a day (BID) | ORAL | 1 refills | Status: DC

## 2024-01-29 MED ORDER — POTASSIUM CHLORIDE CRYS ER 20 MEQ PO TBCR: 40.0000 meq | EXTENDED_RELEASE_TABLET | Freq: Every day | ORAL | 0 refills | Status: DC

## 2024-01-29 NOTE — Assessment & Plan Note (Signed)
Chronic issue.  She will follow-up with her orthopedist in the near future.  She can continue tramadol 50 mg every 12 hours as needed.

## 2024-01-29 NOTE — Assessment & Plan Note (Addendum)
Chronic issue.  Generally well-controlled at home.  She will continue HCTZ 25 mg daily, carvedilol 12.5 mg twice daily, and amlodipine 10 mg daily.

## 2024-01-29 NOTE — Progress Notes (Signed)
Marikay Alar, MD Phone: [phone]  Gail Baker is a 68 y.o. female who presents today for f/u.  HYPERTENSION Disease Monitoring Home BP Monitoring typically 120s/70s when she is not in pain with her knees Chest pain- no    Dyspnea- no Medications Compliance-  taking amlodipine, coreg, HCTZ.  Edema- sometimes in her left leg when she stands for a long period of time, resolves with getting off her legs, has only been an issue since starting to have issues with her knees.  BMET    Component Value Date/Time   NA 141 07/24/2023 0819   K 3.5 07/24/2023 0819   CL 106 07/24/2023 0819   CO2 28 07/24/2023 0819   GLUCOSE 84 07/24/2023 0819   BUN 12 07/24/2023 0819   CREATININE 0.65 07/24/2023 0819   CALCIUM 9.2 07/24/2023 0819   GFRNONAA >60 05/10/2020 1208   GFRAA >60 05/10/2020 1208   HLD: taking crestor. No RUQ pain.  Knee OA: had injections in her knees that were beneficial for 2 months. She see's orthopedics next month.  Reports having some increased heart rate after her injections previously though she was having some GI issues that have since resolved.  Reports omeprazole was beneficial for the GI issues.  Social History   Tobacco Use  Smoking Status Never  Smokeless Tobacco Never    Current Outpatient Medications on File Prior to Visit  Medication Sig Dispense Refill   alendronate (FOSAMAX) 70 MG tablet Take with a full glass of water on an empty stomach.TAKE 1 TABLET BY MOUTH ONCE WEEKLY ON AN EMPTY STOMACH BEFORE BREAKFAST. REMAIN UPRIGHT FOR 30 MINUTES AND TAKE WITH 8 OUNCES OF WATER 12 tablet 3   amLODipine (NORVASC) 10 MG tablet TAKE 1 TABLET BY MOUTH DAILY 90 tablet 1   aspirin EC 81 MG tablet Take 81 mg by mouth daily.     Cholecalciferol (VITAMIN D3) 125 MCG (5000 UT) TABS Take by mouth. Pt takes 2 daily     Diclofenac Sodium (PENNSAID) 2 % SOLN Place 2 g onto the skin 2 (two) times daily. 112 g 3   dicyclomine (BENTYL) 10 MG capsule TAKE ONE CAPSULE BY  MOUTH THREE TIMES A DAY AS NEEDED FOR CRAMPS 90 capsule 1   hydrochlorothiazide (HYDRODIURIL) 25 MG tablet TAKE 1 TABLET BY MOUTH DAILY 90 tablet 1   Probiotic Product (DIGESTIVE ADVANTAGE PO) Take by mouth daily.     rosuvastatin (CRESTOR) 10 MG tablet TAKE 1 TABLET BY MOUTH DAILY 90 tablet 3   traMADol (ULTRAM) 50 MG tablet Take 1 tablet (50 mg total) by mouth every 12 (twelve) hours as needed. 30 tablet 0   No current facility-administered medications on file prior to visit.     ROS see history of present illness  Objective  Physical Exam Vitals:   01/29/24 0809 01/29/24 0826  BP: 134/78 124/72  Pulse: 82   Resp: 18   Temp: 99.1 F (37.3 C)   SpO2: 100%     BP Readings from Last 3 Encounters:  01/29/24 124/72  07/24/23 132/84  05/03/23 122/84   Wt Readings from Last 3 Encounters:  01/29/24 191 lb 8 oz (86.9 kg)  07/24/23 197 lb 6.4 oz (89.5 kg)  05/03/23 195 lb (88.5 kg)    Physical Exam Constitutional:      General: She is not in acute distress.    Appearance: She is not diaphoretic.  Cardiovascular:     Rate and Rhythm: Normal rate and regular rhythm.  Heart sounds: Normal heart sounds.  Pulmonary:     Effort: Pulmonary effort is normal.     Breath sounds: Normal breath sounds.  Skin:    General: Skin is warm and dry.  Neurological:     Mental Status: She is alert.      Assessment/Plan: Please see individual problem list.  Primary hypertension Assessment & Plan: Chronic issue.  Generally well-controlled at home.  She will continue HCTZ 25 mg daily, carvedilol 12.5 mg twice daily, and amlodipine 10 mg daily.  Orders: -     Carvedilol; Take 1 tablet (12.5 mg total) by mouth 2 (two) times daily with a meal.  Dispense: 180 tablet; Refill: 1  Gastroesophageal reflux disease, unspecified whether esophagitis present Assessment & Plan: Chronic issue.  Symptoms she had previously may have been related to reflux as they improved with omeprazole.  She  will monitor.   Hyperlipidemia, unspecified hyperlipidemia type Assessment & Plan: Chronic issue.  Continue Crestor 10 mg daily.  Check lipid panel.  Orders: -     Comprehensive metabolic panel -     Lipid panel  Primary osteoarthritis of both knees Assessment & Plan: Chronic issue.  She will follow-up with her orthopedist in the near future.  She can continue tramadol 50 mg every 12 hours as needed.      Health Maintenance: Discussed referral for colonoscopy though patient defers and would like to think about this.  She will let us know if she would like to proceed with this.  Advised we could do yearly fecal occult blood testing if she declines colonoscopy altogether.  Encourage patient get Shingrix vaccine at the pharmacy.  Return in about 6 months (around 07/28/2024) for transfer Dr Clent Ridges.   Marikay Alar, MD [facility]

## 2024-01-29 NOTE — Assessment & Plan Note (Signed)
Chronic issue.  Symptoms she had previously may have been related to reflux as they improved with omeprazole.  She will monitor.

## 2024-01-29 NOTE — Assessment & Plan Note (Signed)
Chronic issue.  Continue Crestor 10 mg daily.  Check lipid panel.

## 2024-02-12 ENCOUNTER — Other Ambulatory Visit: Payer: Self-pay | Admitting: Family Medicine

## 2024-02-12 ENCOUNTER — Other Ambulatory Visit (INDEPENDENT_AMBULATORY_CARE_PROVIDER_SITE_OTHER): Payer: Managed Care, Other (non HMO)

## 2024-02-12 ENCOUNTER — Telehealth: Payer: Self-pay

## 2024-02-12 DIAGNOSIS — E876 Hypokalemia: Secondary | ICD-10-CM

## 2024-02-12 LAB — POTASSIUM: Potassium: 3.2 meq/L — ABNORMAL LOW (ref 3.5–5.1)

## 2024-02-12 MED ORDER — POTASSIUM CHLORIDE CRYS ER 20 MEQ PO TBCR: 40.0000 meq | EXTENDED_RELEASE_TABLET | Freq: Every day | ORAL | 0 refills | Status: DC

## 2024-02-12 NOTE — Telephone Encounter (Signed)
Copied from CRM [id number]. Topic: Clinical - Lab/Test Results >> Feb 12, 2024  1:43 PM Almira Coaster wrote: Reason for CRM: Patient spoke with Malen Gauze regarding lab results for potassium, She would like to follow up on a few questions she has. Patient's call back number is  [phone].

## 2024-02-12 NOTE — Telephone Encounter (Signed)
Spoke with patient to clarify questions. Patient wanted to know if it was OK for her to take a potassium powder to mix with her drinks along with the prescription. Per Dr Birdie Sons prefers for the patient to just only take the prescription prescribed. Patient notified and verbalized understanding.

## 2024-02-19 ENCOUNTER — Other Ambulatory Visit: Payer: Managed Care, Other (non HMO)

## 2024-02-20 ENCOUNTER — Other Ambulatory Visit (INDEPENDENT_AMBULATORY_CARE_PROVIDER_SITE_OTHER): Payer: Managed Care, Other (non HMO)

## 2024-02-20 DIAGNOSIS — E876 Hypokalemia: Secondary | ICD-10-CM

## 2024-02-20 LAB — POTASSIUM: Potassium: 3.3 meq/L — ABNORMAL LOW (ref 3.5–5.1)

## 2024-02-21 ENCOUNTER — Other Ambulatory Visit: Payer: Self-pay | Admitting: Family Medicine

## 2024-02-21 DIAGNOSIS — E876 Hypokalemia: Secondary | ICD-10-CM

## 2024-02-21 MED ORDER — POTASSIUM CHLORIDE CRYS ER 20 MEQ PO TBCR
40.0000 meq | EXTENDED_RELEASE_TABLET | Freq: Every day | ORAL | 0 refills | Status: DC
Start: 2024-02-21 — End: 2024-03-11

## 2024-02-28 ENCOUNTER — Other Ambulatory Visit (INDEPENDENT_AMBULATORY_CARE_PROVIDER_SITE_OTHER): Payer: Managed Care, Other (non HMO)

## 2024-02-28 DIAGNOSIS — E876 Hypokalemia: Secondary | ICD-10-CM

## 2024-02-28 LAB — BASIC METABOLIC PANEL
BUN: 9 mg/dL (ref 6–23)
CO2: 27 meq/L (ref 19–32)
Calcium: 9 mg/dL (ref 8.4–10.5)
Chloride: 107 meq/L (ref 96–112)
Creatinine, Ser: 0.62 mg/dL (ref 0.40–1.20)
GFR: 91.08 mL/min (ref >60.00)
Glucose, Bld: 86 mg/dL (ref 70–99)
Potassium: 4 meq/L (ref 3.5–5.1)
Sodium: 141 meq/L (ref 135–145)

## 2024-03-02 ENCOUNTER — Encounter: Payer: Self-pay | Admitting: Family

## 2024-03-05 ENCOUNTER — Telehealth: Payer: Self-pay

## 2024-03-05 NOTE — Telephone Encounter (Signed)
 Copied from CRM [id number]. Topic: General - Other >> Mar 05, 2024 10:41 AM Alphonzo Lemmings O wrote: Reason for CRM: patient is calling cause she went to vain doctor and he asked for patient to ask her dr to change patient blood pressure medication. Patient old doctor is not there anymore . She has a appointment with Dr. Clent Ridges and would like to get this message to new doctor [phone]

## 2024-03-06 ENCOUNTER — Telehealth: Payer: Self-pay

## 2024-03-06 NOTE — Telephone Encounter (Signed)
 Copied from CRM [id number]. Topic: Clinical - Medical Advice >> Mar 06, 2024 12:25 PM Melissa C wrote: Reason for CRM: patient was calling regarding this message from yesterday- patient is calling cause she went to vain doctor and he asked for patient to ask her dr to change patient blood pressure medication. Patient old doctor is not there anymore . She has a appointment with Dr. Clent Ridges and would like to get this message to new doctor  [phone] I let patient know that the message was sent to the doctor/clinical team but patient would like a call back please so she can talk to them and explain

## 2024-03-06 NOTE — Telephone Encounter (Signed)
 Went to vein doctor because her legs are swelling and the the dr wanted to know if her pcp could change the Amlodipine to something else.

## 2024-03-09 NOTE — Telephone Encounter (Signed)
 Mychart message sent to pt to call the office to schedule an appointment for medication change.

## 2024-03-10 NOTE — Telephone Encounter (Signed)
 Pt called and schedule an appointment to discuss medication change.

## 2024-03-10 NOTE — Telephone Encounter (Signed)
 Pt has appt on 03/11/2024

## 2024-03-11 ENCOUNTER — Encounter: Payer: Self-pay | Admitting: Family Medicine

## 2024-03-11 ENCOUNTER — Ambulatory Visit (INDEPENDENT_AMBULATORY_CARE_PROVIDER_SITE_OTHER): Admitting: Family Medicine

## 2024-03-11 VITALS — BP 120/76 | HR 87 | Temp 98.0°F | Resp 20 | Ht 61.0 in | Wt 190.1 lb

## 2024-03-11 DIAGNOSIS — I10 Essential (primary) hypertension: Secondary | ICD-10-CM

## 2024-03-11 DIAGNOSIS — I872 Venous insufficiency (chronic) (peripheral): Secondary | ICD-10-CM

## 2024-03-11 DIAGNOSIS — E785 Hyperlipidemia, unspecified: Secondary | ICD-10-CM | POA: Diagnosis not present

## 2024-03-11 LAB — COMPREHENSIVE METABOLIC PANEL
ALT: 10 U/L (ref 0–35)
AST: 18 U/L (ref 0–37)
Albumin: 4 g/dL (ref 3.5–5.2)
Alkaline Phosphatase: 65 U/L (ref 39–117)
BUN: 11 mg/dL (ref 6–23)
CO2: 29 meq/L (ref 19–32)
Calcium: 9.3 mg/dL (ref 8.4–10.5)
Chloride: 105 meq/L (ref 96–112)
Creatinine, Ser: 0.7 mg/dL (ref 0.40–1.20)
GFR: 88.43 mL/min (ref >60.00)
Glucose, Bld: 172 mg/dL — ABNORMAL HIGH (ref 70–99)
Potassium: 3.6 meq/L (ref 3.5–5.1)
Sodium: 139 meq/L (ref 135–145)
Total Bilirubin: 0.4 mg/dL (ref 0.2–1.2)
Total Protein: 7.3 g/dL (ref 6.0–8.3)

## 2024-03-11 MED ORDER — HYDROCHLOROTHIAZIDE 25 MG PO TABS: 12.5000 mg | ORAL_TABLET | Freq: Every day | ORAL | Status: DC

## 2024-03-11 MED ORDER — SPIRONOLACTONE 25 MG PO TABS: 25.0000 mg | ORAL_TABLET | Freq: Every day | ORAL | 0 refills | Status: DC

## 2024-03-11 NOTE — Patient Instructions (Signed)
 It was a pleasure meeting you today. Thank you for allowing me to take part in your health care.  Our goals for today as we discussed include:  Stop Amlodipine Start Aldactone 25 mg daily Continue Carvedilol and Hydrochlorothiazide as previously prescribed  Monitor blood pressure.  Goal <150/90  Notify MD of blood pressure results on Friday  Follow up in 1 week   This is a list of the screening recommended for you and due dates:  Health Maintenance  Topic Date Due   Zoster (Shingles) Vaccine (1 of 2) Never done   Colon Cancer Screening  11/27/2019   Pneumonia Vaccine (1 of 1 - PCV) Never done   COVID-19 Vaccine (5 - 2024-25 season) 09/01/2023   Flu Shot  03/30/2024*   Mammogram  01/07/2026   DTaP/Tdap/Td vaccine (2 - Tdap) 05/09/2031   DEXA scan (bone density measurement)  Completed   Hepatitis C Screening  Completed   HPV Vaccine  Aged Out  *Topic was postponed. The date shown is not the original due date.       If you have any questions or concerns, please do not hesitate to call the office at [phone].  I look forward to our next visit and until then take care and stay safe.  Regards,   Dana Allan, MD   [facility]

## 2024-03-11 NOTE — Progress Notes (Signed)
 SUBJECTIVE:   Chief Complaint  Patient presents with   Medication Management    vain doctor wanted pcp to change amlodipine   HPI Presents for acute visit  Discussed the use of AI scribe software for clinical note transcription with the patient, who gave verbal consent to proceed.  History of Present Illness The patient is a 68 year old with hypertension who presents with leg swelling. She was referred by a knee doctor to see a vein specialist for evaluation of leg swelling.  She has been experiencing leg swelling for approximately two months, initially noticed during a visit to a knee doctor. A subsequent evaluation by a vein specialist, Dr. Melvern Sample, included a scan that showed no lymphedema or blockages. Despite the normal scan results, there was a suggestion to discuss with her primary doctor the possibility of changing her blood pressure medication, amlodipine, as it might be contributing to the swelling.  She has been on amlodipine for almost ten years. Her blood pressure at home is typically around 120/76, and she monitors it regularly. She has been trying to manage her blood pressure through dietary changes, including consuming juices made from beets, pineapple, carrots, and pomegranate.  She is currently taking several medications including amlodipine, carvedilol, hydrochlorothiazide, and rosuvastatin. Her hydrochlorothiazide dose was reduced about three weeks ago due to low potassium levels, for which she was given potassium tablets for three weeks.  No heart issues, kidney issues, diabetes, or thyroid problems. She also mentioned experiencing a brief episode of itching and burning in the genital area on Sunday, which resolved on its own. No current symptoms of chest pain, headaches, or shortness of breath.      PERTINENT PMH / PSH: As above  OBJECTIVE:  BP 120/76   Pulse 87   Temp 98 F (36.7 C)   Resp 20   Ht 5\' 1"  (1.549 m)   Wt 190 lb 2 oz (86.2 kg)   SpO2 100%    BMI 35.92 kg/m    Physical Exam Vitals reviewed.  Constitutional:      General: She is not in acute distress.    Appearance: Normal appearance. She is obese. She is not ill-appearing, toxic-appearing or diaphoretic.  Eyes:     General:        Right eye: No discharge.        Left eye: No discharge.     Conjunctiva/sclera: Conjunctivae normal.  Neck:     Thyroid: No thyromegaly or thyroid tenderness.  Cardiovascular:     Rate and Rhythm: Normal rate and regular rhythm.     Heart sounds: Normal heart sounds.  Pulmonary:     Effort: Pulmonary effort is normal.     Breath sounds: Normal breath sounds.  Abdominal:     General: Bowel sounds are normal.  Musculoskeletal:        General: Normal range of motion.     Right lower leg: Edema present.     Left lower leg: Edema present.  Skin:    General: Skin is warm and dry.  Neurological:     General: No focal deficit present.     Mental Status: She is alert and oriented to person, place, and time. Mental status is at baseline.  Psychiatric:        Mood and Affect: Mood normal.        Behavior: Behavior normal.        Thought Content: Thought content normal.        Judgment: Judgment  normal.           03/11/2024   12:56 PM 01/29/2024    8:15 AM 07/24/2023    8:04 AM 10/17/2022   10:36 AM 02/14/2022   10:59 AM  Depression screen PHQ 2/9  Decreased Interest 0 0 0 0 0  Down, Depressed, Hopeless 0 0 0 0 0  PHQ - 2 Score 0 0 0 0 0  Altered sleeping 0 0 0    Tired, decreased energy 0 0 0    Change in appetite 0 0 0    Feeling bad or failure about yourself  0 0 0    Trouble concentrating 0 0 0    Moving slowly or fidgety/restless 0 0 0    Suicidal thoughts 0 0 0    PHQ-9 Score 0 0 0    Difficult doing work/chores Not difficult at all Not difficult at all Not difficult at all        03/11/2024   12:56 PM 01/29/2024    8:15 AM 07/24/2023    8:05 AM  GAD 7 : Generalized Anxiety Score  Nervous, Anxious, on Edge 0 0 0   Control/stop worrying 0 0 0  Worry too much - different things 0 0 0  Trouble relaxing 0 0 0  Restless 0 0 0  Easily annoyed or irritable 0 0 0  Afraid - awful might happen 0 0 0  Total GAD 7 Score 0 0 0  Anxiety Difficulty Not difficult at all Not difficult at all Not difficult at all    ASSESSMENT/PLAN:  Primary hypertension Assessment & Plan: Hypertension well-controlled with carvedilol and hydrochlorothiazide. Adjustments needed due to amlodipine discontinuation. Spironolactone chosen for potassium-sparing effect. - Monitor blood pressure frequently at home. - Discontinue Amlodipine due to chronic lymphedema - Start Spironolactone 25 mg daily  - Continue hydrochlorothiazide 25 mg daily - Consider increasing carvedilol if needed. - Consider adding losartan if potassium levels allow. - Monitor for symptoms of elevated blood pressure.  Orders: -     Comprehensive metabolic panel -     Spironolactone; Take 1 tablet (25 mg total) by mouth daily.  Dispense: 90 tablet; Refill: 0 -     hydroCHLOROthiazide; Take 0.5 tablets (12.5 mg total) by mouth daily.  Hyperlipidemia, unspecified hyperlipidemia type Assessment & Plan: Managed with rosuvastatin.   Chronic venous insufficiency Assessment & Plan: Peripheral edema likely due to amlodipine. Recent bilateral duplex negative for DVT. Compression stockings recommended by vascular. - Initiate spironolactone 25 mg. - Discontinue Amlodipine - Monitor blood pressure frequently at home. - Wear compression stockings  - Order comprehensive metabolic panel. - Follow up with Vascular in [facility] as scheduled      PDMP reviewed  Return in about 1 week (around 03/18/2024) for PCP, HTN.  Dana Allan, MD

## 2024-03-16 ENCOUNTER — Encounter: Payer: Self-pay | Admitting: Family Medicine

## 2024-03-16 DIAGNOSIS — I872 Venous insufficiency (chronic) (peripheral): Secondary | ICD-10-CM | POA: Insufficient documentation

## 2024-03-16 NOTE — Assessment & Plan Note (Signed)
 Hypertension well-controlled with carvedilol and hydrochlorothiazide. Adjustments needed due to amlodipine discontinuation. Spironolactone chosen for potassium-sparing effect. - Monitor blood pressure frequently at home. - Discontinue Amlodipine due to chronic lymphedema - Start Spironolactone 25 mg daily  - Continue hydrochlorothiazide 25 mg daily - Consider increasing carvedilol if needed. - Consider adding losartan if potassium levels allow. - Monitor for symptoms of elevated blood pressure.

## 2024-03-16 NOTE — Assessment & Plan Note (Signed)
 Peripheral edema likely due to amlodipine. Recent bilateral duplex negative for DVT. Compression stockings recommended by vascular. - Initiate spironolactone 25 mg. - Discontinue Amlodipine - Monitor blood pressure frequently at home. - Wear compression stockings  - Order comprehensive metabolic panel. - Follow up with Vascular in [facility] as scheduled

## 2024-03-16 NOTE — Assessment & Plan Note (Signed)
Managed with rosuvastatin  ?

## 2024-03-18 ENCOUNTER — Ambulatory Visit: Admitting: Family Medicine

## 2024-04-02 ENCOUNTER — Telehealth: Payer: Self-pay | Admitting: Family Medicine

## 2024-04-02 NOTE — Telephone Encounter (Signed)
 Copied from CRM [id number]. Topic: Appointments - Transfer of Care >> Apr 02, 2024  8:57 AM Melissa C wrote: Pt is requesting to transfer FROM: Dr. Birdie Sons Pt is requesting to transfer TO: [facility] Reason for requested transfer: Dr. Birdie Sons left It is the responsibility of the team the patient would like to transfer to [facility]) to reach out to the patient if for any reason this transfer is not acceptable.

## 2024-04-28 ENCOUNTER — Encounter: Payer: Self-pay | Admitting: Gastroenterology

## 2024-05-06 ENCOUNTER — Encounter (HOSPITAL_COMMUNITY): Payer: Self-pay

## 2024-05-13 ENCOUNTER — Ambulatory Visit (INDEPENDENT_AMBULATORY_CARE_PROVIDER_SITE_OTHER): Payer: Self-pay | Admitting: Nurse Practitioner

## 2024-05-13 ENCOUNTER — Encounter: Payer: Self-pay | Admitting: Nurse Practitioner

## 2024-05-13 VITALS — BP 138/90 | HR 79 | Temp 98.2°F | Ht 61.85 in | Wt 187.0 lb

## 2024-05-13 DIAGNOSIS — Z7689 Persons encountering health services in other specified circumstances: Secondary | ICD-10-CM

## 2024-05-13 DIAGNOSIS — I10 Essential (primary) hypertension: Secondary | ICD-10-CM | POA: Diagnosis not present

## 2024-05-13 DIAGNOSIS — I872 Venous insufficiency (chronic) (peripheral): Secondary | ICD-10-CM

## 2024-05-13 DIAGNOSIS — E785 Hyperlipidemia, unspecified: Secondary | ICD-10-CM | POA: Diagnosis not present

## 2024-05-13 DIAGNOSIS — M810 Age-related osteoporosis without current pathological fracture: Secondary | ICD-10-CM

## 2024-05-13 MED ORDER — VALSARTAN 80 MG PO TABS: 80.0000 mg | ORAL_TABLET | Freq: Every day | ORAL | 1 refills | Status: DC

## 2024-05-13 NOTE — Assessment & Plan Note (Signed)
 Hx of the same on hydrochlorothiazide  12.5mg   and carvedilol  12.5mg  BID. Will add on valsartan 80mg  daily. Discontinue the spironolactone  and amlodipine . Check BP at home 3 times a week

## 2024-05-13 NOTE — Patient Instructions (Signed)
 Nice to see you today We will change your blood pressure medication and add on valsartan. Check blood pressure at home 3 times a week.  Follow up with me in 1 month, sooner if you need me

## 2024-05-13 NOTE — Assessment & Plan Note (Signed)
 Dexa due 2026. On alendronate  70mg  weekly. Continue

## 2024-05-13 NOTE — Assessment & Plan Note (Signed)
 Followed by vascular in [place] She is elevating her legs and wearing compression hose.

## 2024-05-13 NOTE — Assessment & Plan Note (Signed)
 Maintained on crestor  10mg  dialy. Continue

## 2024-05-13 NOTE — Progress Notes (Signed)
 Established Patient Office Visit  Subjective   Patient ID: Gail Baker, female    DOB: 1956-07-10  Age: 68 y.o. MRN: [id number]  Chief Complaint  Patient presents with   Establish Care    Pt complains of need for medication management. Pt states that she needs amlodipine  modified in order to go back to her vein specialist.     HPI  HTN: patient is currently maintained on carvedilol  12.5mg  BID, hydrochlorothiazide  12.5mg  and spironolactone  25mg  daily. States that she is taking the carvediolol and 0.5 of hydrochlorothiazide . Stats that she was told to stop the sprionlactione. States that she can check her blood pressure at home twice a week. No lightheadedness or dizziness   HLD: currently on crestor  10mg  daily   Venous insufficiency: is followed by vascular and amlodipine  was discontinued. States that the swelling Is less since coming off the amlodipine . She is using compression garments and elevating the legs. 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 Over due. States she has appt with GI and will mention it to them  Pap Smear: aged out Mammogram: 01/08/2024 DEXA: 10/16/2023  TDAP: 2022 Flu: 02/12/2024 PNA: 2019 Shingles: first vaccine? Needs second vaccine. Get at local pharmacy     Review of Systems  Constitutional:  Negative for chills and fever.  Respiratory:  Negative for shortness of breath.   Cardiovascular:  Negative for chest pain and leg swelling.  Gastrointestinal:  Negative for abdominal pain, blood in stool, constipation, diarrhea, nausea and vomiting.       BM daily   Genitourinary:  Negative for dysuria and hematuria.  Neurological:  Negative for tingling and headaches.  Psychiatric/Behavioral:  Negative for hallucinations and suicidal ideas.       Objective:     BP (!) 138/90   Pulse 79   Temp 98.2 F (36.8 C) (Oral)   Ht 5'  1.85" (1.571 m)   Wt 187 lb (84.8 kg)   SpO2 98%   BMI 34.37 kg/m  BP Readings from Last 3 Encounters:  05/13/24 (!) 138/90  03/11/24 120/76  01/29/24 124/72   Wt Readings from Last 3 Encounters:  05/13/24 187 lb (84.8 kg)  03/11/24 190 lb 2 oz (86.2 kg)  01/29/24 191 lb 8 oz (86.9 kg)   SpO2 Readings from Last 3 Encounters:  05/13/24 98%  03/11/24 100%  01/29/24 100%      Physical Exam Vitals and nursing note reviewed.  Constitutional:      Appearance: Normal appearance.  HENT:     Right Ear: Tympanic membrane, ear canal and external ear normal.     Left Ear: Tympanic membrane, ear canal and external ear normal.     Mouth/Throat:     Mouth: Mucous membranes are moist.     Pharynx: Oropharynx is clear.  Eyes:     Extraocular Movements: Extraocular movements intact.     Pupils: Pupils are equal, round, and reactive to light.  Cardiovascular:     Rate and Rhythm: Normal rate and regular rhythm.     Pulses: Normal pulses.     Heart sounds: Normal heart sounds.  Pulmonary:     Effort: Pulmonary effort is normal.     Breath sounds: Normal breath sounds.  Musculoskeletal:     Right lower leg: Edema present.     Left lower leg: Edema present.  Lymphadenopathy:  Cervical: No cervical adenopathy.  Skin:    General: Skin is warm.  Neurological:     General: No focal deficit present.     Mental Status: She is alert.     Comments: Bilateral upper and lower extremity strength 5/5  Psychiatric:        Mood and Affect: Mood normal.        Behavior: Behavior normal.        Thought Content: Thought content normal.        Judgment: Judgment normal.      No results found for any visits on 05/13/24.    The ASCVD Risk score (Arnett DK, et al., 2019) failed to calculate for the following reasons:   The valid total cholesterol range is 130 to 320 mg/dL    Assessment & Plan:   Problem List Items Addressed This Visit       Cardiovascular and Mediastinum    Hypertension - Primary (Chronic)   Hx of the same on hydrochlorothiazide  12.5mg   and carvedilol  12.5mg  BID. Will add on valsartan 80mg  daily. Discontinue the spironolactone  and amlodipine . Check BP at home 3 times a week       Relevant Medications   valsartan (DIOVAN) 80 MG tablet   Chronic venous insufficiency   Followed by vascular in [place] She is elevating her legs and wearing compression hose.       Relevant Medications   valsartan (DIOVAN) 80 MG tablet     Musculoskeletal and Integument   Osteoporosis (Chronic)   Dexa due 2026. On alendronate  70mg  weekly. Continue          Other   Hyperlipidemia (Chronic)   Maintained on crestor  10mg  dialy. Continue       Relevant Medications   valsartan (DIOVAN) 80 MG tablet   Other Visit Diagnoses       Encounter to establish care with new doctor           Return in about 4 weeks (around 06/10/2024) for BP recheck.    Margarie Shay, NP

## 2024-05-15 ENCOUNTER — Other Ambulatory Visit: Payer: Self-pay

## 2024-05-15 DIAGNOSIS — I10 Essential (primary) hypertension: Secondary | ICD-10-CM

## 2024-05-15 NOTE — Telephone Encounter (Signed)
 Last filled 02-02-24 #90 Last OV 05-13-24 Next OV 06-11-24 [facility]

## 2024-05-18 MED ORDER — HYDROCHLOROTHIAZIDE 25 MG PO TABS: 12.5000 mg | ORAL_TABLET | Freq: Every day | ORAL | 0 refills | Status: DC

## 2024-05-28 ENCOUNTER — Telehealth: Payer: Self-pay | Admitting: Nurse Practitioner

## 2024-05-28 NOTE — Telephone Encounter (Signed)
 Call and see how she is doing with the valsartan  80mg   Also see what her blood pressure readings have been

## 2024-05-28 NOTE — Telephone Encounter (Signed)
 Contacted pt.  Pt states she is doing well on valsartan  and takes medication every morning.  Pt states she does not check readings during the week while working.   BP readings are from last weekend: 140/87, 130/80, 120/80(taken Sunday or Monday)

## 2024-05-28 NOTE — Telephone Encounter (Signed)
-----   Message from [facility] sent at 05/13/2024  2:31 PM EDT ----- Regarding: BP Call and see how she is doing with the valsartan  80mg

## 2024-06-03 ENCOUNTER — Ambulatory Visit: Payer: Self-pay

## 2024-06-03 NOTE — Telephone Encounter (Signed)
 duplicate  Copied from CRM [id number]. Topic: Clinical - Pink Word Triage >> Jun 03, 2024  2:03 PM Gail Baker wrote: Reason for Triage: Patient is calling because she is taking valsartan  , and has started experiencing headaches which she feels may be a side effect. Asked patient if she was experiencing any other side effects and patient declined just headaches.

## 2024-06-03 NOTE — Telephone Encounter (Signed)
 FYI Only or Action Required?: Action required by provider  Patient was last seen in primary care on 05/13/2024 by Dorothe Gaster, NP. Called Nurse Triage reporting Medication Problem. Symptoms began several weeks ago. Interventions attempted: Nothing. Symptoms are: unchanged.  Triage Disposition: See Physician Within 24 Hours-- Pt decline appt. Would like PCP to know and inquiring on next steps.   Patient/caregiver understands and will follow disposition?: No, wishes to speak with PCP          Reason for Disposition  [1] New headache AND [2] age > 50    Related to new medication she is taking,  Answer Assessment - Initial Assessment Questions 1. LOCATION: "Where does it hurt?"       -----Entire Head   2. ONSET: "When did the headache start?" (Minutes, hours or days)     --- After taking the medication- Valsartan     3. PATTERN: "Does the pain come and go, or has it been constant since it started?"     ------------ Constant, starts to dissipate by the afternoon.    4. SEVERITY: "How bad is the pain?" and "What does it keep you from doing?"  (e.g., Scale 1-10; mild, moderate, or severe)   - MILD (1-3): doesn't interfere with normal activities    - MODERATE (4-7): interferes with normal activities or awakens from sleep    - SEVERE (8-10): excruciating pain, unable to do any normal activities        -------------------5/10 : Reports she can still conduct her normal activities.    5. RECURRENT SYMPTOM: "Have you ever had headaches before?" If Yes, ask: "When was the last time?" and "What happened that time?"    ----------------  -Denies  hx of headaches. This is new onset since initiating meds.    6. CAUSE: "What do you think is causing the headache?" ----------Valsartan        7. MIGRAINE: "Have you been diagnosed with migraine headaches?" If Yes, ask: "Is this headache similar?"      ---------------- Denies   8. HEAD INJURY: "Has there been any recent injury to the head?"       -------------Denies   9. OTHER SYMPTOMS: "Do you have any other symptoms?" (fever, stiff neck, eye pain, sore throat, cold symptoms)     ----- Denies   Additional Information:  -Pt declined an appointment.  -Pt would like her PCP to be aware of the current s/e she is experiencing.with Valsartan . Would like PCP to tell her next actions.  Protocols used: [facility]

## 2024-06-03 NOTE — Telephone Encounter (Signed)
 Pt complains of having medication problems with Valsartan  80 mg.  Pt states that couple minutes after taking medication in the morning pt will have headaches lasting until afternoon.  No dizziness, lightheadedness or any symptoms.

## 2024-06-03 NOTE — Telephone Encounter (Signed)
 Can we call the patient and gather more information. She has an appt with me on 06/10/2024 it looks like

## 2024-06-04 ENCOUNTER — Telehealth: Payer: Self-pay

## 2024-06-04 NOTE — Telephone Encounter (Signed)
 Pt states that she is not checking her BP and is taking whole not half the pill. Advised pt to start documenting the BP readings and try taking the pill In half to see if that helps.  Pt verbalized understanding and has no questions at this time.   See other encounter for more documentation.

## 2024-06-04 NOTE — Telephone Encounter (Signed)
 Left detailed voicemail for patient to call the office back.

## 2024-06-04 NOTE — Telephone Encounter (Signed)
 Is she able to half it and see if that helps? Has see been checking her blood pressure when she gets these headaches. If so what have the reading been

## 2024-06-04 NOTE — Telephone Encounter (Signed)
 Copied from CRM [id number]. Topic: Clinical - Medical Advice >> Jun 04, 2024 12:10 PM Taleah C wrote: Reason for CRM: patient called back to return Janae's call regarding the triage encounter from yesterday (see chart/ it didn't let me addend). I relayed the question Royston Cornea asked about her taking half of the med and if she is checking her BP. She stated that it happens while she's at work so she has not been checking it. Please call back and advise.

## 2024-06-04 NOTE — Telephone Encounter (Signed)
 See other encounter. Closing this thread.

## 2024-06-05 ENCOUNTER — Other Ambulatory Visit: Payer: Self-pay | Admitting: Family Medicine

## 2024-06-05 DIAGNOSIS — I10 Essential (primary) hypertension: Secondary | ICD-10-CM

## 2024-06-11 ENCOUNTER — Ambulatory Visit: Admitting: Nurse Practitioner

## 2024-06-11 NOTE — Progress Notes (Deleted)
   Established Patient Office Visit  Subjective   Patient ID: Gail Baker, female    DOB: 02/19/1956  Age: 68 y.o. MRN: [id number]  No chief complaint on file.   HPI  HTN: Patient currently maintained on carvedilol  12.5 mg twice daily, 12.5 mg HCTZ, and valsartan  80 mg daily. At last office the patient had stopped her screening lactam.  She does have ability to check blood pressure at home at that juncture patient was not having any dizziness or lightheadedness.  But blood pressure was elevated at 138/90  {History (Optional):23778}  ROS    Objective:     There were no vitals taken for this visit. {Vitals History (Optional):23777}  Physical Exam   No results found for any visits on 06/11/24.  {Labs (Optional):23779}  The ASCVD Risk score (Arnett DK, et al., 2019) failed to calculate for the following reasons:   The valid total cholesterol range is 130 to 320 mg/dL    Assessment & Plan:   Problem List Items Addressed This Visit   None   No follow-ups on file.    Margarie Shay, NP

## 2024-06-24 ENCOUNTER — Ambulatory Visit: Admitting: Gastroenterology

## 2024-06-24 NOTE — Progress Notes (Deleted)
 Chief Complaint: Primary GI MD:  HPI: 68 year old female with a history of H. pylori status post treatment, history of adenomatous polyps, mild constipation, hypertension and hypothyroidism    Discussed the use of AI scribe software for clinical note transcription with the patient, who gave verbal consent to proceed.  History of Present Illness      PREVIOUS GI WORKUP   Colonoscopy 10/2016 - One 5 mm polyp in the ascending colon, removed with a cold snare. Resected and retrieved.  - One 4 mm polyp in the ascending colon, removed with a cold biopsy forceps. Resected and retrieved.  - Two 7 to 10 mm polyps in the sigmoid colon, removed with a hot snare. Resected and retrieved.  - One 3 mm polyp in the rectum, removed with a cold biopsy forceps. Resected and retrieved.  - The distal rectum and anal verge are normal on retroflexion view.  EGD 10/2016 - Normal esophagus.  - Gastritis. Biopsied.  - Normal examined duodenum.  Diagnosis 1. Surgical [P], gastric body - CHRONIC HELICOBACTER PYLORI GASTRITIS. - WARTHIN-STARRY STAIN IS POSITIVE FOR HELICOBACTER PYLORI. - NO INTESTINAL METAPLASIA, DYSPLASIA, OR MALIGNANCY. 2. Surgical [P], ascending, polyp (2) - TUBULAR ADENOMA (X 1). - BENIGN COLORECTAL MUCOSA, REMAINING TISSUE. - NO HIGH GRADE DYSPLASIA OR MALIGNANCY IDENTIFIED. 3. Surgical [P], sigmoid, polyp (2) - TUBULAR ADENOMA (X 2). - NO HIGH GRADE DYSPLASIA OR MALIGNANCY IDENTIFIED. 4. Surgical [P], rectum, polyp - BENIGN POLYPOID COLORECTAL MUCOSA, TWO FRAGMENTS. - NO DYSPLASIA OR MALIGNANCY IDENTIFIED.  Past Medical History:  Diagnosis Date   Asthma    GERD (gastroesophageal reflux disease) 04/01/2019   H. pylori infection    History of UTI    Hyperlipidemia    Hypertension    Hyperthyroidism    Normocytic anemia 05/10/2020   Osteoporosis 01/02/2021   Pes anserinus bursitis of both knees 07/29/2018   Tubular adenoma of colon     Past Surgical History:   Procedure Laterality Date   BREAST BIOPSY Right 08/15/2017   FIBROADENOMATOID CHANGE    CESAREAN SECTION      Current Outpatient Medications  Medication Sig Dispense Refill   alendronate  (FOSAMAX ) 70 MG tablet Take with a full glass of water on an empty stomach.TAKE 1 TABLET BY MOUTH ONCE WEEKLY ON AN EMPTY STOMACH BEFORE BREAKFAST. REMAIN UPRIGHT FOR 30 MINUTES AND TAKE WITH 8 OUNCES OF WATER 12 tablet 3   aspirin EC 81 MG tablet Take 81 mg by mouth daily.     carvedilol  (COREG ) 12.5 MG tablet Take 1 tablet (12.5 mg total) by mouth 2 (two) times daily with a meal. 180 tablet 1   Cholecalciferol (VITAMIN D3) 125 MCG (5000 UT) TABS Take by mouth. Pt takes 2 daily     Diclofenac  Sodium (PENNSAID ) 2 % SOLN Place 2 g onto the skin 2 (two) times daily. 112 g 3   dicyclomine  (BENTYL ) 10 MG capsule TAKE ONE CAPSULE BY MOUTH THREE TIMES A DAY AS NEEDED FOR CRAMPS 90 capsule 1   hydrochlorothiazide  (HYDRODIURIL ) 25 MG tablet Take 0.5 tablets (12.5 mg total) by mouth daily. 45 tablet 0   Probiotic Product (DIGESTIVE ADVANTAGE PO) Take by mouth daily.     rosuvastatin  (CRESTOR ) 10 MG tablet TAKE 1 TABLET BY MOUTH DAILY 90 tablet 3   valsartan  (DIOVAN ) 80 MG tablet Take 1 tablet (80 mg total) by mouth daily. 30 tablet 1   No current facility-administered medications for this visit.    Allergies as of 06/24/2024   (  No Known Allergies)    Family History  Problem Relation Age of Onset   Hypertension Mother    Hypertension Father    Diabetes Father    Colon cancer Neg Hx    Colon polyps Neg Hx    Pancreatic cancer Neg Hx    Rectal cancer Neg Hx    Stomach cancer Neg Hx    Breast cancer Neg Hx     Social History   Socioeconomic History   Marital status: Single    Spouse name: Not on file   Number of children: 1   Years of education: Not on file   Highest education level: Not on file  Occupational History   Not on file  Tobacco Use   Smoking status: Never   Smokeless tobacco: Never   Vaping Use   Vaping status: Never Used  Substance and Sexual Activity   Alcohol use: No    Alcohol/week: 0.0 standard drinks of alcohol   Drug use: No   Sexual activity: Not on file  Other Topics Concern   Not on file  Social History Narrative   Lives in [place]; Equities trader at [employer]; from [place]. No smoking or alcohol.    Social Drivers of Corporate investment banker Strain: Not on file  Food Insecurity: Not on file  Transportation Needs: Not on file  Physical Activity: Not on file  Stress: Not on file  Social Connections: Not on file  Intimate Partner Violence: Not on file    Review of Systems:    Constitutional: No weight loss, fever, chills, weakness or fatigue HEENT: Eyes: No change in vision               Ears, Nose, Throat:  No change in hearing or congestion Skin: No rash or itching Cardiovascular: No chest pain, chest pressure or palpitations   Respiratory: No SOB or cough Gastrointestinal: See HPI and otherwise negative Genitourinary: No dysuria or change in urinary frequency Neurological: No headache, dizziness or syncope Musculoskeletal: No new muscle or joint pain Hematologic: No bleeding or bruising Psychiatric: No history of depression or anxiety    Physical Exam:  Vital signs: There were no vitals taken for this visit.  Constitutional: NAD, alert and cooperative Head:  Normocephalic and atraumatic. Eyes:   PEERL, EOMI. No icterus. Conjunctiva pink. Respiratory: Respirations even and unlabored. Lungs clear to auscultation bilaterally.   No wheezes, crackles, or rhonchi.  Cardiovascular:  Regular rate and rhythm. No peripheral edema, cyanosis or pallor.  Gastrointestinal:  Soft, nondistended, nontender. No rebound or guarding. Normal bowel sounds. No appreciable masses or hepatomegaly. Rectal:  Declines Msk:  Symmetrical without gross deformities. Without edema, no deformity or joint abnormality.  Neurologic:  Alert and  oriented x4;   grossly normal neurologically.  Skin:   Dry and intact without significant lesions or rashes. Psychiatric: Oriented to person, place and time. Demonstrates good judgement and reason without abnormal affect or behaviors.  Physical Exam    RELEVANT LABS AND IMAGING: CBC    Component Value Date/Time   WBC 3.0 (L) 07/24/2023 0819   RBC 4.02 07/24/2023 0819   HGB 11.9 (L) 07/24/2023 0819   HCT 35.4 (L) 07/24/2023 0819   PLT 233.0 07/24/2023 0819   MCV 88.1 07/24/2023 0819   MCH 28.8 05/10/2020 1208   MCHC 33.5 07/24/2023 0819   RDW 13.8 07/24/2023 0819   LYMPHSABS 1.1 07/24/2023 0819   MONOABS 0.5 07/24/2023 0819   EOSABS 0.2 07/24/2023 0819  BASOSABS 0.0 07/24/2023 0819    CMP     Component Value Date/Time   NA 139 03/11/2024 1330   K 3.6 03/11/2024 1330   CL 105 03/11/2024 1330   CO2 29 03/11/2024 1330   GLUCOSE 172 (H) 03/11/2024 1330   BUN 11 03/11/2024 1330   CREATININE 0.70 03/11/2024 1330   CALCIUM  9.3 03/11/2024 1330   PROT 7.3 03/11/2024 1330   ALBUMIN 4.0 03/11/2024 1330   AST 18 03/11/2024 1330   ALT 10 03/11/2024 1330   ALKPHOS 65 03/11/2024 1330   BILITOT 0.4 03/11/2024 1330   GFRNONAA >60 05/10/2020 1208   GFRAA >60 05/10/2020 1208     Assessment/Plan:   Assessment and Plan Assessment & Plan      History of colon polyps   --surveillance colonoscopy recommended November 2020    Nestor Blower, [facility] 06/24/2024, 7:54 AM  Cc: No ref. provider found

## 2024-07-09 ENCOUNTER — Other Ambulatory Visit: Payer: Self-pay | Admitting: Nurse Practitioner

## 2024-07-09 DIAGNOSIS — I10 Essential (primary) hypertension: Secondary | ICD-10-CM

## 2024-07-09 NOTE — Telephone Encounter (Signed)
 Over due for an office visit to recheck BP. Looks like she has an appointment scheduled for [place] station. If she is transferring care that is fine. The new provider can take over at that point

## 2024-07-10 NOTE — Telephone Encounter (Signed)
 LVM to schedule

## 2024-07-16 NOTE — Telephone Encounter (Signed)
 Patient scheduled at [facility] on 07/29/24 for TOC.

## 2024-07-29 ENCOUNTER — Encounter: Payer: Managed Care, Other (non HMO) | Admitting: Family Medicine

## 2024-07-29 ENCOUNTER — Ambulatory Visit: Payer: Self-pay

## 2024-07-29 ENCOUNTER — Ambulatory Visit (INDEPENDENT_AMBULATORY_CARE_PROVIDER_SITE_OTHER)

## 2024-07-29 VITALS — BP 140/90 | HR 72 | Temp 98.9°F | Ht 62.0 in | Wt 180.8 lb

## 2024-07-29 DIAGNOSIS — Z131 Encounter for screening for diabetes mellitus: Secondary | ICD-10-CM | POA: Diagnosis not present

## 2024-07-29 DIAGNOSIS — I10 Essential (primary) hypertension: Secondary | ICD-10-CM | POA: Diagnosis not present

## 2024-07-29 DIAGNOSIS — M810 Age-related osteoporosis without current pathological fracture: Secondary | ICD-10-CM | POA: Diagnosis not present

## 2024-07-29 DIAGNOSIS — I700 Atherosclerosis of aorta: Secondary | ICD-10-CM

## 2024-07-29 DIAGNOSIS — E782 Mixed hyperlipidemia: Secondary | ICD-10-CM

## 2024-07-29 DIAGNOSIS — E66811 Obesity, class 1: Secondary | ICD-10-CM

## 2024-07-29 DIAGNOSIS — D709 Neutropenia, unspecified: Secondary | ICD-10-CM | POA: Diagnosis not present

## 2024-07-29 DIAGNOSIS — M170 Bilateral primary osteoarthritis of knee: Secondary | ICD-10-CM

## 2024-07-29 DIAGNOSIS — Z6833 Body mass index (BMI) 33.0-33.9, adult: Secondary | ICD-10-CM

## 2024-07-29 DIAGNOSIS — E785 Hyperlipidemia, unspecified: Secondary | ICD-10-CM

## 2024-07-29 DIAGNOSIS — E876 Hypokalemia: Secondary | ICD-10-CM

## 2024-07-29 DIAGNOSIS — D126 Benign neoplasm of colon, unspecified: Secondary | ICD-10-CM

## 2024-07-29 LAB — HEMOGLOBIN A1C: Hgb A1c MFr Bld: 5.7 % (ref 4.6–6.5)

## 2024-07-29 MED ORDER — ROSUVASTATIN CALCIUM 10 MG PO TABS: 10.0000 mg | ORAL_TABLET | Freq: Every day | ORAL | 3 refills | Status: AC

## 2024-07-29 MED ORDER — ALENDRONATE SODIUM 70 MG PO TABS
ORAL_TABLET | ORAL | 3 refills | Status: AC
Start: 2024-07-29 — End: ?

## 2024-07-29 MED ORDER — DICLOFENAC SODIUM 1 % EX GEL
2.0000 g | Freq: Four times a day (QID) | CUTANEOUS | 5 refills | Status: AC | PRN
Start: 2024-07-29 — End: ?

## 2024-07-29 MED ORDER — CARVEDILOL 12.5 MG PO TABS: 12.5000 mg | ORAL_TABLET | Freq: Two times a day (BID) | ORAL | 3 refills | Status: AC

## 2024-07-29 MED ORDER — TELMISARTAN 20 MG PO TABS
20.0000 mg | ORAL_TABLET | Freq: Every day | ORAL | 0 refills | Status: DC
Start: 2024-07-29 — End: 2024-10-23

## 2024-07-29 NOTE — Assessment & Plan Note (Addendum)
 BP not well controlled.  Was on Hydrochlorthiazode 25 mg once daily, caused hypokalemia which resolved with 12.5 mg once daily dose. Recent lab with normal Potassium. Continue hydrochlorothiazide  12.5 mg once daily.  Continue Carvedilol  12.5 mg BID. Did not tolerate Valsartan  ( reports of feeling jittery) Start Telmisartanb 20 mg once daily.  Counseling on DASH diet done.  Check home BP, goal BP <130/80 mmHg, f/u in 3 months with home BP reading, repeat BMP during f/u.

## 2024-07-29 NOTE — Assessment & Plan Note (Signed)
>>  ASSESSMENT AND PLAN FOR CHRONIC PAIN OF RIGHT KNEE WRITTEN ON 07/29/2024 10:44 AM BY ABBEY BRUCKNER, MD  - Bilateral knee pain, right worse than left. Has tried Voltaren  gel in the past PRN without significant symptoms improvement.  - Patient requesting starting home exercise on her own and would like to see PT if pain does not improve.  I also recommend using Voltaren  gel up to four times a day, ice/heat, staying active with activities as tolerated, prn tylenol up to 2 gm per day for pain. Weight loss recommended.  - F/U in 3 months. I counseled if above measures does not help PT referral for strengthening exercise.

## 2024-07-29 NOTE — Assessment & Plan Note (Signed)
 01/29/24 Lipid panel with goal LDL. Continue Rosuvastatin  10 mg daily.

## 2024-07-29 NOTE — Assessment & Plan Note (Signed)
 I encouraged continued healthy diet, cutting down on bread intake, regular exercise.

## 2024-07-29 NOTE — Progress Notes (Signed)
 Established Patient Office Visit TOC from Dr. Maribeth    Subjective  Patient ID: Gail Baker, female    DOB: [birth year]  Age: 68 y.o. MRN: [id number]  Chief Complaint  Patient presents with   Establish Care    She  has a past medical history of Asthma, GERD (gastroesophageal reflux disease) (04/01/2019), H. pylori infection, Hyperlipidemia, Hypertension, Hyperthyroidism, Normocytic anemia (05/10/2020), Osteoporosis (01/02/2021), Pes anserinus bursitis of both knees (07/29/2018), and Tubular adenoma of colon.  HPI Discussed the use of AI scribe software for clinical note transcription with the patient, who gave verbal consent to proceed.  History of Present Illness Gail Baker is a 68 year old female presents to establish care.   - Osteoporosis: Last DEXA on 10/16/23. On alendronate  for about three years, taking it once a week without exacerbating her pre-existing acid reflux.  She has adjusted her diet by reducing coffee intake and uses medication for acid reflux, which has been beneficial. She also takes vitamin D  and calcium  supplements.  - HTN: Her hypertension is managed with carvedilol  twice a day and hydrochlorothiazide  12.5 mg once a day.  She discontinued valsartan  about three weeks ago after experiencing jittery feelings similar to drinking too much coffee. Home blood pressure readings vary, sometimes as low as 113/72 mmHg. Denies chest pain, lower leg edema.   - B/L knee pain, right worse than left: She experiences chronic right knee pain, present for over eight years, and has been told it is arthritis. The pain sometimes causes her to limp and affects her ability to stand for long periods. She has tried diclofenac  gel without relief and is planning to start home exercises with a new machine she ordered. She avoids using tramadol  due to concerns about its side effects.  - Due for diagnostic colonoscopy due to h/o colon polyps: Not interested in updating this due to  financial reason. She denies unintentional weight loss, change in appetite, blood in stool.   - Takes Rosuvastatin  10 mg daily for hyperlipidemia. On 81 mg Aspirin.    ROSAs per HPI    Objective:     BP (!) 140/90 (BP Location: Left Arm, Cuff Size: Large)   Pulse 72   Temp 98.9 F (37.2 C) (Oral)   Ht 5' 2 (1.575 m)   Wt 180 lb 12.8 oz (82 kg)   SpO2 99%   BMI 33.07 kg/m      07/29/2024   10:07 AM 05/13/2024    2:18 PM 03/11/2024   12:56 PM  Depression screen PHQ 2/9  Decreased Interest 0 0 0  Down, Depressed, Hopeless 0 0 0  PHQ - 2 Score 0 0 0  Altered sleeping 0 0 0  Tired, decreased energy 0 0 0  Change in appetite 0 0 0  Feeling bad or failure about yourself  0 0 0  Trouble concentrating 0 0 0  Moving slowly or fidgety/restless 0 0 0  Suicidal thoughts 0 0 0  PHQ-9 Score 0 0 0  Difficult doing work/chores Not difficult at all Not difficult at all Not difficult at all      07/29/2024   10:07 AM 05/13/2024    2:18 PM 03/11/2024   12:56 PM 01/29/2024    8:15 AM  GAD 7 : Generalized Anxiety Score  Nervous, Anxious, on Edge 0 0 0 0  Control/stop worrying 0 0 0 0  Worry too much - different things 0 0 0 0  Trouble relaxing 0 0 0 0  Restless 0 0 0 0  Easily annoyed or irritable 0 0 0 0  Afraid - awful might happen 0 0 0 0  Total GAD 7 Score 0 0 0 0  Anxiety Difficulty Not difficult at all Not difficult at all Not difficult at all Not difficult at all      07/29/2024   10:07 AM 05/13/2024    2:18 PM 03/11/2024   12:56 PM  Depression screen PHQ 2/9  Decreased Interest 0 0 0  Down, Depressed, Hopeless 0 0 0  PHQ - 2 Score 0 0 0  Altered sleeping 0 0 0  Tired, decreased energy 0 0 0  Change in appetite 0 0 0  Feeling bad or failure about yourself  0 0 0  Trouble concentrating 0 0 0  Moving slowly or fidgety/restless 0 0 0  Suicidal thoughts 0 0 0  PHQ-9 Score 0 0 0  Difficult doing work/chores Not difficult at all Not difficult at all Not difficult at all       07/29/2024   10:07 AM 05/13/2024    2:18 PM 03/11/2024   12:56 PM 01/29/2024    8:15 AM  GAD 7 : Generalized Anxiety Score  Nervous, Anxious, on Edge 0 0 0 0  Control/stop worrying 0 0 0 0  Worry too much - different things 0 0 0 0  Trouble relaxing 0 0 0 0  Restless 0 0 0 0  Easily annoyed or irritable 0 0 0 0  Afraid - awful might happen 0 0 0 0  Total GAD 7 Score 0 0 0 0  Anxiety Difficulty Not difficult at all Not difficult at all Not difficult at all Not difficult at all   SDOH Screenings   Depression (PHQ2-9): Low Risk  (07/29/2024)  Tobacco Use: Low Risk  (07/29/2024)     Physical Exam Constitutional:      Appearance: Normal appearance.  HENT:     Head: Normocephalic and atraumatic.     Right Ear: Tympanic membrane normal.     Left Ear: Tympanic membrane normal.     Mouth/Throat:     Mouth: Mucous membranes are moist.  Neck:     Thyroid : No thyroid  mass or thyroid  tenderness.  Cardiovascular:     Rate and Rhythm: Normal rate and regular rhythm.     Heart sounds: No murmur heard. Pulmonary:     Effort: Pulmonary effort is normal.     Breath sounds: Normal breath sounds. No wheezing.  Abdominal:     General: Abdomen is protuberant. Bowel sounds are normal.     Palpations: Abdomen is soft.     Tenderness: There is no abdominal tenderness. There is no guarding.  Musculoskeletal:     Cervical back: Neck supple. No rigidity.     Right knee: Crepitus present. No erythema or lacerations. Decreased range of motion (pain on knee extension).     Left knee: No crepitus.     Right lower leg: Edema (non-pitting bilatral lower leg edema noted) present.     Left lower leg: Edema (non-pitting bilatral lower leg edema noted) present.  Skin:    General: Skin is warm.  Neurological:     Mental Status: She is alert and oriented to person, place, and time.     Gait: Gait abnormal (antalgic gait).  Psychiatric:        Mood and Affect: Mood normal.        Behavior: Behavior  normal.  No results found for any visits on 07/29/24.  The ASCVD Risk score (Arnett DK, et al., 2019) failed to calculate for the following reasons:   The valid total cholesterol range is 130 to 320 mg/dL     Assessment & Plan:   Primary hypertension Assessment & Plan: BP not well controlled.  Was on Hydrochlorthiazode 25 mg once daily, caused hypokalemia which resolved with 12.5 mg once daily dose. Recent lab with normal Potassium. Continue hydrochlorothiazide  12.5 mg once daily.  Continue Carvedilol  12.5 mg BID. Did not tolerate Valsartan  ( reports of feeling jittery) Start Telmisartanb 20 mg once daily.  Counseling on DASH diet done.  Check home BP, goal BP <130/80 mmHg, f/u in 3 months with home BP reading, repeat BMP during f/u.   Orders: -     Carvedilol ; Take 1 tablet (12.5 mg total) by mouth 2 (two) times daily with a meal.  Dispense: 180 tablet; Refill: 3 -     Hemoglobin A1c -     Telmisartan ; Take 1 tablet (20 mg total) by mouth daily.  Dispense: 90 tablet; Refill: 0  Age-related osteoporosis without current pathological fracture Assessment & Plan: Dexa due 2026, October.  On alendronate  70mg  weekly, tolerating well. Continue    Orders: -     Alendronate  Sodium; Take with a full glass of water on an empty stomach.TAKE 1 TABLET BY MOUTH ONCE WEEKLY ON AN EMPTY STOMACH BEFORE BREAKFAST. REMAIN UPRIGHT FOR 30 MINUTES AND TAKE WITH 8 OUNCES OF WATER  Dispense: 12 tablet; Refill: 3  Mixed hyperlipidemia Assessment & Plan: 01/29/24 Lipid panel with goal LDL. Continue Rosuvastatin  10 mg daily.   Orders: -     Rosuvastatin  Calcium ; Take 1 tablet (10 mg total) by mouth daily.  Dispense: 90 tablet; Refill: 3  Neutropenia, unspecified type [facility]) Assessment & Plan: Recheck CBC, has seen heme/onc in the past.   Orders: -     CBC With Diff/Platelet  Encounter for screening examination for impaired glucose regulation and diabetes mellitus Assessment &  Plan: 03/11/24, Cmp with hyperglycemia, patient was not fasting. Check A1c   Orders: -     Hemoglobin A1c  Class 1 obesity with serious comorbidity and body mass index (BMI) of 33.0 to 33.9 in adult, unspecified obesity type Assessment & Plan: I encouraged continued healthy diet, cutting down on bread intake, regular exercise.     Primary osteoarthritis of both knees Assessment & Plan:  >>ASSESSMENT AND PLAN FOR CHRONIC PAIN OF RIGHT KNEE WRITTEN ON 07/29/2024 10:44 AM BY ABBEY BRUCKNER, MD  - Bilateral knee pain, right worse than left. Has tried Voltaren  gel in the past PRN without significant symptoms improvement.  - Patient requesting starting home exercise on her own and would like to see PT if pain does not improve.  I also recommend using Voltaren  gel up to four times a day, ice/heat, staying active with activities as tolerated, prn tylenol up to 2 gm per day for pain. Weight loss recommended.  - F/U in 3 months. I counseled if above measures does not help PT referral for strengthening exercise.   Orders: -     Diclofenac  Sodium; Apply 2 g topically 4 (four) times daily as needed.  Dispense: 100 g; Refill: 5  Tubular adenoma of colon Assessment & Plan: I discussed that the patient is due for colonoscopy, she kindly declined. Discussed colonoscopy will be both diagnostic and therapeutic but patient declines at this time due to financial reasons.    Atherosclerosis of aorta (HCC)  Assessment & Plan: Chronic, continue risk factor management.    I spent 45 minutes on the day of this face-to-face encounter reviewing the patient's medical and surgical history, medications, ongoing concerns, and reviewing the assessment and plan with the patient. This time also included counseling the patient on their health conditions and management options. Additionally, I spent time post-visit ordering and reviewing diagnostics and therapeutics with the patient.  Return in about 3 months (around  10/29/2024) for BP .   Luke Shade, MD

## 2024-07-29 NOTE — Assessment & Plan Note (Signed)
 03/11/24, Cmp with hyperglycemia, patient was not fasting. Check A1c

## 2024-07-29 NOTE — Patient Instructions (Addendum)
 Please update second dose of your shingles vaccine through your local pharmacy. Check with them to see if you can get pneumonia vaccine updated as well.   I am discontinuing valsartan . I am starting you on a blood pressure medication called Temisartan 20 mg, please take this once a day. Please check BP at home 2-3 times a week. Keep a record of home BP reading and bring it with you during your next appointment. I would want to see you in 3 months for BP and right knee pain follow up. Goal BP is 130/80 mmHg.   You can apply Diclofenac  gel four times a day regularly to help with knee pain. Stay active, use ice pack/heat to help with pain as well. It's okay to take Tylenol 500 mg, up to four times a day as needed if knee pain is really bad.

## 2024-07-29 NOTE — Assessment & Plan Note (Signed)
 Recheck CBC, has seen heme/onc in the past.

## 2024-07-29 NOTE — Assessment & Plan Note (Signed)
 I discussed that the patient is due for colonoscopy, she kindly declined. Discussed colonoscopy will be both diagnostic and therapeutic but patient declines at this time due to financial reasons.

## 2024-07-29 NOTE — Assessment & Plan Note (Signed)
-   Bilateral knee pain, right worse than left. Has tried Voltaren  gel in the past PRN without significant symptoms improvement.  - Patient requesting starting home exercise on her own and would like to see PT if pain does not improve.  I also recommend using Voltaren  gel up to four times a day, ice/heat, staying active with activities as tolerated, prn tylenol up to 2 gm per day for pain. Weight loss recommended.  - F/U in 3 months. I counseled if above measures does not help PT referral for strengthening exercise.

## 2024-07-29 NOTE — Assessment & Plan Note (Addendum)
 Dexa due 2026, October.  On alendronate  70mg  weekly, tolerating well. Has been on this for about 3 years now, consider holding off around 2027.

## 2024-07-29 NOTE — Assessment & Plan Note (Signed)
 Chronic, continue risk factor management.

## 2024-07-30 ENCOUNTER — Other Ambulatory Visit: Payer: Self-pay

## 2024-07-30 DIAGNOSIS — I10 Essential (primary) hypertension: Secondary | ICD-10-CM

## 2024-07-30 LAB — CBC WITH DIFF/PLATELET
Basophils Absolute: 0 x10E3/uL (ref 0.0–0.2)
Basos: 0 %
EOS (ABSOLUTE): 0.2 x10E3/uL (ref 0.0–0.4)
Eos: 6 %
Hematocrit: 40.5 % (ref 34.0–46.6)
Hemoglobin: 12.4 g/dL (ref 11.1–15.9)
Immature Grans (Abs): 0 x10E3/uL (ref 0.0–0.1)
Immature Granulocytes: 0 %
Lymphocytes Absolute: 1.4 x10E3/uL (ref 0.7–3.1)
Lymphs: 39 %
MCH: 28.4 pg (ref 26.6–33.0)
MCHC: 30.6 g/dL — ABNORMAL LOW (ref 31.5–35.7)
MCV: 93 fL (ref 79–97)
Monocytes Absolute: 0.5 x10E3/uL (ref 0.1–0.9)
Monocytes: 15 %
Neutrophils Absolute: 1.4 x10E3/uL (ref 1.4–7.0)
Neutrophils: 39 %
Platelets: 229 x10E3/uL (ref 150–450)
RBC: 4.36 x10E6/uL (ref 3.77–5.28)
RDW: 13.1 % (ref 11.7–15.4)
WBC: 3.5 x10E3/uL (ref 3.4–10.8)

## 2024-07-30 MED ORDER — HYDROCHLOROTHIAZIDE 25 MG PO TABS
12.5000 mg | ORAL_TABLET | Freq: Every day | ORAL | 0 refills | Status: DC
Start: 2024-07-30 — End: 2024-10-05

## 2024-10-05 ENCOUNTER — Other Ambulatory Visit: Payer: Self-pay | Admitting: Nurse Practitioner

## 2024-10-05 DIAGNOSIS — I10 Essential (primary) hypertension: Secondary | ICD-10-CM

## 2024-10-23 ENCOUNTER — Other Ambulatory Visit: Payer: Self-pay

## 2024-10-23 DIAGNOSIS — I10 Essential (primary) hypertension: Secondary | ICD-10-CM

## 2024-10-29 ENCOUNTER — Ambulatory Visit: Payer: Self-pay

## 2024-10-29 ENCOUNTER — Ambulatory Visit (INDEPENDENT_AMBULATORY_CARE_PROVIDER_SITE_OTHER)

## 2024-10-29 VITALS — BP 140/90 | HR 67 | Temp 99.3°F | Ht 64.0 in | Wt 183.0 lb

## 2024-10-29 DIAGNOSIS — E669 Obesity, unspecified: Secondary | ICD-10-CM | POA: Diagnosis not present

## 2024-10-29 DIAGNOSIS — M170 Bilateral primary osteoarthritis of knee: Secondary | ICD-10-CM

## 2024-10-29 DIAGNOSIS — I10 Essential (primary) hypertension: Secondary | ICD-10-CM

## 2024-10-29 LAB — BASIC METABOLIC PANEL WITH GFR
BUN: 11 mg/dL (ref 6–23)
CO2: 29 meq/L (ref 19–32)
Calcium: 9.2 mg/dL (ref 8.4–10.5)
Chloride: 105 meq/L (ref 96–112)
Creatinine, Ser: 0.63 mg/dL (ref 0.40–1.20)
GFR: 90.3 mL/min (ref >60.00)
Glucose, Bld: 80 mg/dL (ref 70–99)
Potassium: 3.7 meq/L (ref 3.5–5.1)
Sodium: 142 meq/L (ref 135–145)

## 2024-10-29 MED ORDER — TELMISARTAN 40 MG PO TABS: 40.0000 mg | ORAL_TABLET | Freq: Every day | ORAL | 1 refills | Status: AC

## 2024-10-29 NOTE — Assessment & Plan Note (Signed)
 Significant pain improvement with bioactive patch, reducing knee brace use. No current pain medication needed.Continue bioactive patch as needed.  Encourage continued mobility and exercise.

## 2024-10-29 NOTE — Assessment & Plan Note (Signed)
 Chronic, encourage continued healthy lifestyle choices.

## 2024-10-29 NOTE — Patient Instructions (Addendum)
 I have sent a new prescription for Telmisartan  40 mg, take this daily. Continue to check BP at home and update me with home BP readings. Follow up in 6 months with home BP cuff. Let me know if you develop dizziness, feeling tired once you increase dose of this BP medication.    You are due for pneumonia, shingles vaccine which you can update through local pharmacy.    Follow up with Dr. Abbey in 6 months.

## 2024-10-29 NOTE — Assessment & Plan Note (Deleted)
 Gail Baker

## 2024-10-29 NOTE — Assessment & Plan Note (Addendum)
-   Blood pressure variable at home. BP on arrival 160/100 mmHg, repeat  BP 140/90 mmHg. Does not consume caffeine.  - Increase telmisartan  from 20 mg to 40 mg daily and continue  hydrochlorothiazide  12.5 mg (previously caused hypokalemia on 25 mg), coreg  12.5 mg daily).  - Check BMP today. - Check CMP today. - Monitor blood pressure at home and report readings. Share home BP readings through mychart.  - Follow up in six months with home blood pressure cuff. - Advise reporting dizziness, weakness, or fatigue after medication adjustment. Orders:   Basic Metabolic Panel (BMET)

## 2024-10-29 NOTE — Progress Notes (Signed)
 Established Patient Office Visit   Subjective  Patient ID: Gail Baker, female    DOB: [birth year]  Age: 68 y.o. MRN: [id number]  Chief Complaint  Patient presents with   Hypertension    Discussed the use of AI scribe software for clinical note transcription with the patient, who gave verbal consent to proceed.  History of Present Illness Gail Baker is a 68 year old female with hypertension who presents for blood pressure management.  She has been monitoring her blood pressure at home, with readings ranging from 128/80 mmHg to 141/90 mmHg. Her blood pressure tends to be higher at the clinic due to anxiety. She takes telmisartan  20 mg in the morning and hydrochlorothiazide  12.5 mg daily. She is also on Coreg  12.5 mg twice a day.  She previously developed low potassium on a full dose of hydrochlorothiazide , so she continues on a half dose. No dizziness, weakness, or fatigue.  She has a history of borderline prediabetes with a last A1c of 5.7%. She has difficulty reducing carbohydrate intake, particularly bread, consuming two bread rolls in the morning.   She reports bilateral knee pain that has improved significantly with the use of a bioactive patch, which she applies as needed. She no longer requires a knee brace and does not take pain medication. The patch provides relief for up to seven days.  She experiences frequent phlegm production, which she finds bothersome, but denies any significant allergy symptoms. She does not use any medication for this issue.  She has received the flu vaccine and has a history of COVID-19 vaccinations, with some past reactions causing weakness. She has not yet received the pneumonia or shingles vaccines.     ROS As per HPI    Objective:     BP (!) 140/90   Pulse 67   Temp 99.3 F (37.4 C) (Oral)   Ht 5' 4 (1.626 m)   Wt 183 lb (83 kg)   SpO2 99%   BMI 31.41 kg/m      10/29/2024    8:12 AM 07/29/2024   10:07 AM 05/13/2024     2:18 PM  Depression screen PHQ 2/9  Decreased Interest 0 0 0  Down, Depressed, Hopeless 0 0 0  PHQ - 2 Score 0 0 0  Altered sleeping 0 0 0  Tired, decreased energy 0 0 0  Change in appetite 0 0 0  Feeling bad or failure about yourself  0 0 0  Trouble concentrating 0 0 0  Moving slowly or fidgety/restless 0 0 0  Suicidal thoughts 0 0 0  PHQ-9 Score 0 0 0  Difficult doing work/chores Not difficult at all Not difficult at all Not difficult at all      10/29/2024    8:12 AM 07/29/2024   10:07 AM 05/13/2024    2:18 PM 03/11/2024   12:56 PM  GAD 7 : Generalized Anxiety Score  Nervous, Anxious, on Edge 0 0 0 0  Control/stop worrying 0 0 0 0  Worry too much - different things 0 0 0 0  Trouble relaxing 0 0 0 0  Restless 0 0 0 0  Easily annoyed or irritable 0 0 0 0  Afraid - awful might happen 0 0 0 0  Total GAD 7 Score 0 0 0 0  Anxiety Difficulty Not difficult at all Not difficult at all Not difficult at all Not difficult at all      10/29/2024    8:12 AM 07/29/2024  10:07 AM 05/13/2024    2:18 PM  Depression screen PHQ 2/9  Decreased Interest 0 0 0  Down, Depressed, Hopeless 0 0 0  PHQ - 2 Score 0 0 0  Altered sleeping 0 0 0  Tired, decreased energy 0 0 0  Change in appetite 0 0 0  Feeling bad or failure about yourself  0 0 0  Trouble concentrating 0 0 0  Moving slowly or fidgety/restless 0 0 0  Suicidal thoughts 0 0 0  PHQ-9 Score 0 0 0  Difficult doing work/chores Not difficult at all Not difficult at all Not difficult at all      10/29/2024    8:12 AM 07/29/2024   10:07 AM 05/13/2024    2:18 PM 03/11/2024   12:56 PM  GAD 7 : Generalized Anxiety Score  Nervous, Anxious, on Edge 0 0 0 0  Control/stop worrying 0 0 0 0  Worry too much - different things 0 0 0 0  Trouble relaxing 0 0 0 0  Restless 0 0 0 0  Easily annoyed or irritable 0 0 0 0  Afraid - awful might happen 0 0 0 0  Total GAD 7 Score 0 0 0 0  Anxiety Difficulty Not difficult at all Not difficult at all  Not difficult at all Not difficult at all   SDOH Screenings   Depression (PHQ2-9): Low Risk  (10/29/2024)  Tobacco Use: Low Risk  (10/29/2024)     Physical Exam Constitutional:      Appearance: Normal appearance. She is obese.  HENT:     Head: Normocephalic and atraumatic.     Right Ear: Tympanic membrane normal.     Left Ear: Tympanic membrane normal.     Nose: No congestion.     Mouth/Throat:     Mouth: Mucous membranes are moist.  Neck:     Thyroid : No thyroid  mass or thyroid  tenderness.  Cardiovascular:     Rate and Rhythm: Normal rate and regular rhythm.  Pulmonary:     Effort: Pulmonary effort is normal.     Breath sounds: Normal breath sounds. No wheezing.  Abdominal:     General: Bowel sounds are normal.     Palpations: Abdomen is soft.     Tenderness: There is no guarding.  Musculoskeletal:     Cervical back: Neck supple. No rigidity.     Right lower leg: No edema.     Left lower leg: No edema.  Skin:    General: Skin is warm.  Neurological:     Mental Status: She is alert and oriented to person, place, and time.  Psychiatric:        Mood and Affect: Mood normal.        Behavior: Behavior normal.        No results found for any visits on 10/29/24.  The ASCVD Risk score (Arnett DK, et al., 2019) failed to calculate for the following reasons:   The valid total cholesterol range is 130 to 320 mg/dL     Assessment & Plan:  Patient is due for pneumonia and shingles vaccines. Discussed COVID-19 vaccination history. Colonoscopy previously performed, hesitant about repeat. Discussed Cologuard as alternative. Recommend pneumonia and shingles vaccines at local pharmacy. Discuss Cologuard as alternative to colonoscopy. Patient hesitant due to cost. She will reach out to us  if she changes her mind.  Assessment & Plan Primary hypertension - Blood pressure variable at home. BP on arrival 160/100 mmHg, repeat  BP 140/90 mmHg. Does  not consume caffeine.  - Increase  telmisartan  from 20 mg to 40 mg daily and continue  hydrochlorothiazide  12.5 mg (previously caused hypokalemia on 25 mg), coreg  12.5 mg daily).  - Check BMP today. - Check CMP today. - Monitor blood pressure at home and report readings. Share home BP readings through mychart.  - Follow up in six months with home blood pressure cuff. - Advise reporting dizziness, weakness, or fatigue after medication adjustment. Orders:   Basic Metabolic Panel (BMET)  Obesity (BMI 30-39.9) Chronic, encourage continued healthy lifestyle choices.    Primary osteoarthritis of both knees Significant pain improvement with bioactive patch, reducing knee brace use. No current pain medication needed.Continue bioactive patch as needed.  Encourage continued mobility and exercise.      Return in about 6 months (around 04/29/2025) for BP, prediabetes, b/l knee pain, lipid/chronic f/u. Bring BP cuff .   Luke Shade, MD

## 2025-01-04 ENCOUNTER — Other Ambulatory Visit: Payer: Self-pay

## 2025-01-04 DIAGNOSIS — Z1231 Encounter for screening mammogram for malignant neoplasm of breast: Secondary | ICD-10-CM

## 2025-01-05 ENCOUNTER — Encounter: Payer: Self-pay | Admitting: Internal Medicine

## 2025-01-21 ENCOUNTER — Ambulatory Visit
Admission: RE | Admit: 2025-01-21 | Discharge: 2025-01-21 | Disposition: A | Discharge status: Home / Self Care | Source: Ambulatory Visit

## 2025-01-21 DIAGNOSIS — Z1231 Encounter for screening mammogram for malignant neoplasm of breast: Secondary | ICD-10-CM | POA: Insufficient documentation

## 2025-01-25 ENCOUNTER — Ambulatory Visit: Payer: Self-pay

## 2025-02-10 ENCOUNTER — Ambulatory Visit: Admitting: Internal Medicine

## 2025-04-29 ENCOUNTER — Ambulatory Visit
